# Patient Record
Sex: Female | Born: 1955
Health system: Southern US, Community
[De-identification: ages and names within clinical notes are randomized; demographics above are authoritative.]

## PROBLEM LIST (undated history)

## (undated) DIAGNOSIS — Z9889 Other specified postprocedural states: Secondary | ICD-10-CM

## (undated) DIAGNOSIS — R569 Unspecified convulsions: Secondary | ICD-10-CM

## (undated) DIAGNOSIS — M797 Fibromyalgia: Secondary | ICD-10-CM

## (undated) DIAGNOSIS — I10 Essential (primary) hypertension: Secondary | ICD-10-CM

## (undated) DIAGNOSIS — E7800 Pure hypercholesterolemia, unspecified: Secondary | ICD-10-CM

## (undated) DIAGNOSIS — R112 Nausea with vomiting, unspecified: Secondary | ICD-10-CM

## (undated) DIAGNOSIS — E119 Type 2 diabetes mellitus without complications: Secondary | ICD-10-CM

## (undated) DIAGNOSIS — K219 Gastro-esophageal reflux disease without esophagitis: Secondary | ICD-10-CM

## (undated) DIAGNOSIS — M489 Spondylopathy, unspecified: Secondary | ICD-10-CM

## (undated) DIAGNOSIS — J302 Other seasonal allergic rhinitis: Secondary | ICD-10-CM

## (undated) HISTORY — PX: COLONOSCOPY: SHX174

## (undated) HISTORY — PX: BACK SURGERY: SHX140

## (undated) HISTORY — PX: SHOULDER SURGERY: SHX246

## (undated) HISTORY — DX: Fibromyalgia: M79.7

## (undated) HISTORY — PX: TUBAL LIGATION: SHX77

## (undated) HISTORY — DX: Type 2 diabetes mellitus without complications: E11.9

## (undated) HISTORY — PX: CARPAL TUNNEL RELEASE: SHX101

## (undated) HISTORY — DX: Other seasonal allergic rhinitis: J30.2

## (undated) HISTORY — DX: Essential (primary) hypertension: I10

## (undated) HISTORY — DX: Spondylopathy, unspecified: M48.9

---

## 2003-05-15 ENCOUNTER — Ambulatory Visit (HOSPITAL_COMMUNITY): Admission: RE | Admit: 2003-05-15 | Discharge: 2003-05-15 | Payer: Self-pay | Admitting: Obstetrics and Gynecology

## 2003-07-10 ENCOUNTER — Ambulatory Visit (HOSPITAL_COMMUNITY): Admission: RE | Admit: 2003-07-10 | Discharge: 2003-07-10 | Payer: Self-pay | Admitting: Obstetrics and Gynecology

## 2003-08-13 ENCOUNTER — Ambulatory Visit (HOSPITAL_COMMUNITY): Admission: RE | Admit: 2003-08-13 | Discharge: 2003-08-13 | Payer: Self-pay

## 2004-12-23 ENCOUNTER — Other Ambulatory Visit: Admission: RE | Admit: 2004-12-23 | Discharge: 2004-12-23 | Payer: Self-pay | Admitting: Obstetrics and Gynecology

## 2007-05-21 ENCOUNTER — Ambulatory Visit: Payer: Self-pay | Admitting: Cardiology

## 2010-06-19 HISTORY — PX: CERVICAL SPINE SURGERY: SHX589

## 2010-07-21 ENCOUNTER — Ambulatory Visit (INDEPENDENT_AMBULATORY_CARE_PROVIDER_SITE_OTHER): Payer: 59 | Admitting: Gastroenterology

## 2010-07-21 ENCOUNTER — Encounter: Payer: Self-pay | Admitting: Gastroenterology

## 2010-07-21 VITALS — BP 152/95 | HR 93 | Temp 97.7°F | Ht 62.0 in | Wt 179.2 lb

## 2010-07-21 DIAGNOSIS — R197 Diarrhea, unspecified: Secondary | ICD-10-CM

## 2010-07-21 LAB — HEPATIC FUNCTION PANEL
AST: 23 U/L (ref 0–37)
Albumin: 4.4 g/dL (ref 3.5–5.2)
Alkaline Phosphatase: 66 U/L (ref 39–117)
Total Bilirubin: 0.4 mg/dL (ref 0.3–1.2)
Total Protein: 7.3 g/dL (ref 6.0–8.3)

## 2010-07-21 NOTE — Patient Instructions (Signed)
We will look for reasons for your IBS to be out of control. THE REASONS INCLUDE lactose intolerance, Giardiasis picked UP during camping, microscopic colitis, and less likely celiac sprue. CURRENT PLAN 1. FOLLOW  DAIRY FREE DIET FOR 2 WEEKS. 2. IF NO IMPROVEMENT, we will perform a FLEX SIG IN 3 WEEKS. 3. USE LEVSIN AS NEEDED. YOU MAY USE UP TO 8 PER DAY. 4. SUBMIT A STOOL SAMPLE. 5. HAVE BLOOD TEST TO LOOK FOR CELIAC SPRUE. 6. FOLLOW UP IN 1 ONE MONTH.    Lactose Free Diet Lactose is a carbohydrate that is found mainly in milk and milk products, as well as in foods with added milk or whey. Lactose must be digested by the enzyme in order to be used by the body. Lactose intolerance occurs when there is a shortage of lactase. When your body is not able to digest lactose, you may feel sick to your stomach (nausea), bloating, cramping, gas and diarrhea. TYPES OF LACTASE DEFICIENCY  Primary lactase deficiency. This is the most common type. It is characterized by a slow decrease in lactase activity.   Secondary lactase deficiency. This occurs following injury to the small intestinal mucosa as a result of diseases such as celiac disease, nontropical sprue, infectious gastroenteritis (stomach virus), malnutrition, parasites, or inflammatory bowel disease. It can also occur after treatment with medications that kill germs (antibiotics) or cancer drugs, or as a result of surgery.  Tolerance to lactose varies widely, and each person must determine how much milk can be consumed without developing symptoms. Drinking smaller portions of milk throughout the day may be helpful. Some studies suggest that slowing gastric emptying may help increase tolerance of milk products. This may be done by:  Consuming milk or milk products with a meal rather than alone.   Using milk with a higher fat content.  There are many dairy products that may be tolerated better than milk by some people:  Cheese (especially aged  cheese) - the lactose content is much lower than in milk.   The use of cultured dairy products such as yogurt, buttermilk, cottage cheese, and sweet acidophilus milk (Kefir) for lactase-deficient individuals is usually well tolerated. This is because the healthy bacteria help digest lactose.   Lactose-hydrolyzed milk (Lactaid) contains 40-90% less lactose than milk and may also be well tolerated.  ADEQUACY These diets may be deficient in calcium, riboflavin, and vitamin D, according to the Recommended Dietary Allowances of the [employer]. Depending on individual tolerances and the use of milk substitutes, milk, or other dairy products, these recommendations may be met. SPECIAL NOTES  Lactose is a carbohydrates. The major food source is dairy products. Reading food labels is important. Many products contain lactose even when they are not made from milk. Look for the following words: whey, milk solids, dry milk solids, nonfat dry milk powder. Typical sources of lactose other than dairy products include breads, candies, cold cuts, prepared and processed foods, and commercial sauces and gravies.   All foods must be prepared without milk, cream, or other dairy foods.   A vitamin/mineral supplement may be necessary. Consult your physician or Registered Dietitian.   Lactose also is found in many prescription and over-the-counter medications.   Soy milk and lactose-free supplements may be used as an alternative to milk.  FOOD GROUP ALLOWED/RECOMMENDED AVOID/USE SPARINGLY  BREADS / STARCHES 4 servings or more* Breads and rolls made without milk. [place], [place], or [place] bread. Breads and rolls that contain milk. Prepared mixes such  as muffins, biscuits, waffles, pancakes. Sweet rolls, donuts, [place] toast (if made with milk or lactose).  Crackers: Soda crackers, graham crackers. Any crackers prepared without lactose. Zwieback crackers, corn curls, or any that contain lactose.  Cereals:  Cooked or dry cereals prepared without lactose (read labels). Cooked or dry cereals prepared with lactose (read labels). Total, Cocoa Krispies. Special K.  Potatoes / Pasta / Rice: Any prepared without milk or lactose. Popcorn. Instant potatoes, frozen [place] fries, scalloped or au gratin potatoes.  VEGETABLES 2 servings or more Fresh, frozen, and canned vegetables. Creamed or breaded vegetables. Vegetables in a cheese sauce or with lactose-containing margarines.  FRUIT 2 servings or more All fresh, canned, or frozen fruits that are not processed with lactose. Any canned or frozen fruits processed with lactose.  MEAT & SUBSTITUTES 2 servings or more (4 to 6 oz. total per day) Plain beef, chicken, fish, [place], lamb, veal, pork, or ham. Kosher prepared meat products. Strained or junior meats that do not contain milk. Eggs, soy meat substitutes, nuts. Scrambled eggs, omelets, and souffles that contain milk. Creamed or breaded meat, fish, or fowl. Sausage products such as wieners, liver sausage, or cold cuts that contain milk solids. Cheese, cottage cheese, or cheese spreads.  MILK None. (See "BEVERAGES" for milk substitutes. See "DESSERTS" for ice cream and frozen desserts.) Milk (whole, 2%, skim, or chocolate). Evaporated, powdered, or condensed milk; malted milk.  SOUPS & COMBINATION FOODS Bouillon, broth, vegetable soups, clear soups, consomms. Homemade soups made with allowed ingredients. Combination or prepared foods that do not contain milk or milk products (read labels). Cream soups, chowders, commercially prepared soups containing lactose. Macaroni and cheese, pizza. Combination or prepared foods that contain milk or milk products.  DESSERTS & SWEETS In moderation Water and fruit ices; gelatin; angel food cake. Homemade cookies, pies, or cakes made from allowed ingredients. Pudding (if made with water or a milk substitute). Lactose-free tofu desserts. Sugar, honey, corn syrup, jam, jelly;  marmalade; molasses (beet sugar); Pure sugar candy; marshmallows. Ice cream, ice milk, sherbet, custard, pudding, frozen yogurt. Commercial cake and cookie mixes. Desserts that contain chocolate. Pie crust made with milk-containing margarine; reduced-calorie desserts made with a sugar substitute that contains lactose. Toffee, peppermint, butterscotch, chocolate, caramels.  FATS & OILS In moderation Butter (as tolerated; contains very small amounts of lactose). Margarines and dressings that do not contain milk, Vegetable oils, shortening, Miracle Whip, mayonnaise, nondairy cream & whipped toppings without lactose or milk solids added (examples: Coffee Rich, Carnation Coffeemate, Rich's Whipped Topping, PolyRich). Tomasa Blase. Margarines and salad dressings containing milk; cream, cream cheese; peanut butter with added milk solids, sour cream, chip dips, made with sour cream.  BEVERAGES Carbonated drinks; tea; coffee and freeze-dried coffee; some instant coffees (check labels). Fruit drinks; fruit and vegetable juice; Rice or Soy milk. Ovaltine, hot chocolate. Some cocoas; some instant coffees; instant iced teas; powdered fruit drinks (read labels).   CONDIMENTS / MISCELLANEOUS Soy sauce, carob powder, olives, gravy made with water, baker's cocoa, pickles, pure seasonings and spices, wine, pure monosodium glutamate, catsup, mustard. Some chewing gums, chocolate, some cocoas. Certain antibiotics and vitamin / mineral preparations. Spice blends if they contain milk products. MSG extender. Artificial sweeteners that contain lactose such as Equal (Nutra-Sweet) and Sweet 'n Low. Some nondairy creamers (read labels).  * These amounts indicate the minimum number of servings needed from the basic food groups to provide a variety of nutrients essential to good health. A maximum amount is listed if intake of certain  foods must be controlled. Combination foods may count as full or partial servings from the food groups.  Dark green, leafy, or orange vegetables are recommended 3 or 4 times weekly to provide vitamin A. A good source of vitamin C is recommended daily. Potatoes may be included as a serving of vegetables. SAMPLE MENU*  Breakfast   Orange Juice.  Banana.   Bran flakes.   Nondairy Creamer.  Vienna Bread (toasted).   Butter or milk-free margarine.   Coffee or tea.    Noon Meal   Chicken Breast.  Rice.   Green beans.   Butter or milk-free margarine.  Fresh melon.   Coffee or tea.    Evening Meal   Roast Beef.  Baked potato.   Butter or milk-free margarine.   Broccoli.   Lettuce salad with vinegar and oil dressing.  [employer].   Coffee or tea.   Document Released: 08/26/2001 Document Re-Released: 09/02/2007 [facility] Patient Information 2011 [place], [place].

## 2010-07-21 NOTE — Progress Notes (Signed)
Cc to PCP 

## 2010-07-21 NOTE — Progress Notes (Signed)
Pt is aware of her OV in one month

## 2010-07-21 NOTE — Progress Notes (Signed)
  Subjective:    Patient ID: Kelly Wiley, female    DOB: 1955-12-14, 55 y.o.   MRN: [id number] PCP: Dimas Aguas  HPI IBS for 30 years: pain and mixed stool, then passed out. For years has pain relieved by BMs. Also has reflux. Can't eat certain foods. Lots of diarrhea. Lower abd pain. Eats and has to run to [place]. Had GB U/S recently. EGD over five years ago. No test for celiac sprue. No GI visit for these symptoms. Bms: 10 a day over the past 2 years ( constant diarrhea and inability to eat certain foods). HAVING MORE RECTAL URGENCY. Weight loss: none. No problems swallowing. Heartburn: daily-Prilosec for 5 years. Initially helped and now feels like she needs more of it. Weight gain: 30-40 lbs, can't exercise. Lots of nausea and not much vomiting. Recent spinal surgey: Thur. TCS: age 7, Fleisch[place]-no polyps. No blood in her stool. Last labs in [place]. Levsin helps-prn, doesn't use it every day.  LAST TRAVEL LAST YEAR: [place]. Had Abx prior to her surgery. No well water. Remote camping experience. Increase stress over the last 2-3 years and months. On Celexa ~8 mos. ALWAYS HAS PRESSURE IN THE LOWER ABD AND lots of gas. MILK: 1-2 TIMES A DAY, ICE CREAM: 1-2 TIMES A WEEK. CHEESE: AT LEAST 3 TIMES A WEEK.  NO FAM HX OF DIARRHEA  Past Medical History  Diagnosis Date  . Fibromyalgia   . Cervical spine disease   . Seasonal allergies   . DM (diabetes mellitus)   . HTN (hypertension)    Past Surgical History  Procedure Date  . Cervical spine surgery APR 2012  . Carpal tunnel release RIGHT  . Shoulder surgery RIGHT  . Colonoscopy 2008 [facility]     [place]  . Tubal ligation    Family History  Problem Relation Age of Onset  . Colon cancer Neg Hx   . Colon polyps Neg Hx    History  Substance Use Topics  . Smoking status: Never Smoker   . Smokeless tobacco: Not on file  . Alcohol Use: No   History   Social History Narrative   MARRIED, 2 KIDS, YOUNGEST 31 YO.Works in Audiological scientist in  [place]: rareNo tobacco products   Review of Systems  All other systems reviewed and are negative.       Objective:   Physical Exam  Constitutional: She is oriented to person, place, and time.  HENT:  Head: Normocephalic and atraumatic.  Mouth/Throat: Oropharynx is clear and moist. No oropharyngeal exudate.  Eyes: Pupils are equal, round, and reactive to light.  Neck:       LROM, soft C collar in place, wound dressing CD  Cardiovascular: Normal rate and regular rhythm.   Pulmonary/Chest: Effort normal and breath sounds normal.  Abdominal: Soft. Bowel sounds are normal. She exhibits no distension. There is tenderness. There is no rebound and no guarding.       OBESE, MILD TTP LLQ  Musculoskeletal: She exhibits no edema.  Lymphadenopathy:    She has no cervical adenopathy.  Neurological: She is alert and oriented to person, place, and time.  Skin: Skin is warm and dry.  Psychiatric: She has a normal mood and affect.          Assessment & Plan:

## 2010-07-21 NOTE — Progress Notes (Signed)
Pt advised to hold glipizide morning of procedure per [facility].

## 2010-07-21 NOTE — Assessment & Plan Note (Addendum)
The differential diagnosis INCLUDEs lactose intolerance, Giardiasis picked UP during camping, microscopic colitis, and less likely celiac sprue. 1. FOLLOW  DAIRY FREE DIET FOR 2 WEEKS. 2. IF NO IMPROVEMENT, we will perform a FLEX SIG IN 3 WEEKS. HOLD GLIPIZIDE ON AM OF PROCEDURE. 3. USE LEVSIN AS NEEDED. YOU MAY USE UP TO 8 PER DAY. 4. SUBMIT A STOOL SAMPLE. 5. HAVE BLOOD TEST TO LOOK FOR CELIAC SPRUE AND TO CHECK YOUR PROTEIN. 6. FOLLOW UP IN 1 ONE MONTH. MAY ADD A probiotic.

## 2010-07-26 NOTE — Progress Notes (Signed)
Please call pt. Her liver enzymes are nl. She needs to submit her stool samples

## 2010-07-26 NOTE — Progress Notes (Signed)
Pt informed. Said she will do the stool test soon.

## 2010-08-05 NOTE — H&P (Signed)
NAME:  Kelly Wiley, BRUSTER                           ACCOUNT NO.:  [id number]   MEDICAL RECORD NO.:  [id number]                   PATIENT TYPE:  AMB   LOCATION:  [facility]C                                  FACILITY:  [facility]   PHYSICIAN:  James A. Ashley Royalty, M.D.             DATE OF BIRTH:  09-13-1955   DATE OF ADMISSION:  07/10/2003  DATE OF DISCHARGE:                                HISTORY & PHYSICAL   HISTORY OF PRESENT ILLNESS:  This is a 55 year old gravida 2, para 2, with a  several-year history of pelvic pain and low back pain.  She presented to me  for the first time on May 08, 2003, with this complaint.  She states it  is equal bilaterally.  It is constant when it occurs, it tends to occur for  days at a time. There are no associated mediating symptoms.  In the course  of the original consultation she also stated after not having a period for  approximately 12 months, she noted some slight vaginal bleeding in early  February of 2005.   She states that she had an ultrasound in October of 2004 by her previous  gynecologist and it revealed a small fibroid, but otherwise was normal.  She  had a CT scan of the abdomen and pelvis on May 15, 2003.  The study  revealed a fatty infiltrate in the liver.  There was a partially septated  cervical cyst as well measuring 3.1 cm in greatest diameter.  There was a  normal appearing uterus.  The ovaries were normal bilaterally with the  exception of a 1.4 cm left ovarian cyst.  The patient had a sonohysterogram  and endometrial biopsy as well.  The sonohysterogram was obtained in March  of 2005 and corroborated presence of a Nabothian cyst or cysts.  There was a  small fundal/posterior myoma of 4 mg in greatest diameter only.  The adnexa  were unremarkable bilaterally.  Endometrial biopsy was obtained on June 04, 2003, and revealed proliferative endometrium with no hyperplasia.  FSH was  obtained and the value was 41.  The patient is hence for a  diagnostic/operative laparoscopy.   MEDICATIONS:  1. Porcelain Tizanidine 2 mg one at bedtime for fibromyalgia.  2. Triamterene/HCTZ one as needed.  3. Hyoscyamine 0.125 mg one to two tablets as needed (for IBS).  4. Claritin 10 mg as needed.  5. Prilosec 20 mg as needed.  6. Hydrocodone/APAP one tablet daily as needed for fibromyalgia.  7. Glipizide one tablet in a.m. (for high blood sugar).   PAST MEDICAL HISTORY:  1. Fibromyalgia.  2. Rosacea.  3. Irritable bowel syndrome.  4. High blood sugar no definite diagnosis of diabetes.  5. Nephrolithiasis.   PAST SURGICAL HISTORY:  Tubal ligation in 1981.   ALLERGIES:  SULFA, IODINE, DILANTIN, ZARONTIN.   FAMILY HISTORY:  Positive for hypertension and diabetes.   SOCIAL HISTORY:  The patient denies the use of tobacco.  She rarely consumes  alcohol.   REVIEW OF SYSTEMS:  Noncontributory.   PHYSICAL EXAMINATION:  GENERAL:  A well-developed, well-nourished, pleasant,  white female in no acute distress.  VITAL SIGNS:  Afebrile, vital signs stable.  SKIN:  Warm and dry without lesions.  LYMPHS:  There is no supraclavicular, cervical, or inguinal adenopathy.  HEENT:  Normocephalic.  NECK:  Supple without thyromegaly.  CHEST:  Lungs are clear.  HEART:  Regular rate and rhythm without murmurs, rubs, or gallops.  BREASTS:  Deferred.  ABDOMEN:  Soft and nontender without masses or organomegaly.  Bowel sounds  are active.  MUSCULOSKELETAL:  Full range of motion without edema, cyanosis, or CVA  tenderness.  PELVIC:  External genitalia within normal limits.  Vagina and cervix are  without gross lesions.  Bimanual examination reveals the uterus to be  approximately 8 x 4 x 4 cm. No adnexal masses are palpable.   IMPRESSION:  1. Pelvic pain of longstanding duration - etiology uncertain.  Differential     includes endometriosis, adhesions, primary gastrointestinal,     musculoskeletal.  2. Nabothian's cyst or cysts on CT scan.  3.  Fibroid uterus (small).  4. Climacteric.  5. Fibromyalgia.  6. Hypertension.  7. Irritable bowel syndrome.  8. High blood sugar without definite diagnosis of diabetes.  9. Rosacea.  10.      History of nephrolithiasis.  11.      Status post tubal sterilization procedure.   PLAN:  Diagnostic/operative laparoscopy.  Risks, benefits, complications,  and alternatives were fully discussed with the patient.  Possible unilateral  salpingo-oophorectomy discussed and accepted.  Possibility of exploratory  laparotomy discussed and accepted.  Questions invited and answered.                                               James A. Ashley Royalty, M.D.    JAM/MEDQ  D:  07/10/2003  T:  07/10/2003  Job:  [id number]

## 2010-08-05 NOTE — Op Note (Signed)
NAME:  Kelly Wiley, Kelly Wiley                           ACCOUNT NO.:  [id number]   MEDICAL RECORD NO.:  [id number]                   PATIENT TYPE:  AMB   LOCATION:  SDC                                  [facility]ACILITY:  [facility]   PHYSICIAN:  James A. Ashley Royalty, M.D.             DATE OF BIRTH:  1956/02/02   DATE OF PROCEDURE:  07/10/2003  DATE OF DISCHARGE:  07/10/2003                                 OPERATIVE REPORT   PREOPERATIVE DIAGNOSIS:  1. Pelvic pain.  2. Status post tubal sterilization procedure.  3. 2+ cm nabothian cyst.   POSTOPERATIVE DIAGNOSIS:  1. Pelvic pain.  2. Adhesion left colon to left (distal) fallopian tube.   OPERATION PERFORMED:  1. Diagnostic and operative laparoscopy.  2. Lysis of adhesions.   SURGEON:  Rudy Jew. Ashley Royalty, M.D.   ANESTHESIA:  General.   ESTIMATED BLOOD LOSS:  25 mL.   COMPLICATIONS:  None.   PACKS AND DRAINS:  None.   DESCRIPTION OF PROCEDURE:  The patient was taken to the operating room and  placed in dorsal supine position.  After adequate general endotracheal  anesthesia was administered, she was placed in the lithotomy position and  prepped and draped in the usual manner for abdominal and vaginal surgery.  A  posterior weighted retractor was placed in the vagina.  The anterior lip of  the cervix was grabbed with a single toothed tenaculum.  A Jarcho uterine  manipulator was placed per cervix and held in place with a tenaculum.  The  bladder was drained with a red rubber catheter.  0.5% Marcaine with  1:100,000 epinephrine was instilled in the inferior aspect of the umbilicus.  A size 10-11 disposable trocar was placed through the umbilicus into the  abdominal cavity.  Its location was verified by placement of the  laparoscope.  There was no evidence of any trauma.  A pneumoperitoneum was  created with CO2 and maintained throughout the procedure.  Again there was  no evidence of any trauma.  Next, the pelvis was thoroughly surveyed.  The  uterus  was normal size, shape and contour without evidence of any  endometriosis or fibroids.  The ovaries were normal size, shape and contour  bilaterally without any evidence of cysts or endometriosis.  The fallopian  tubes were normal size, shape, contour and length with interruption in the  distal aspect of the proximal ampullary portion consistent with a previous  tubal sterilization procedure.  There was an adhesion from the appendices  epiploica of the left colon to the distal segment of the left fallopian  tube.  The adhesion was rather dense.  The anterior and posterior cul-de-  sacs were clean.  The remainder of the peritoneal surfaces were smooth and  glistening.  The appendix was normal.  Appropriate photos were obtained.  5  mm trocars were placed in the left and right lower quadrants respectively.  Transillumination and direct  visualization techniques were employed.  The  adhesion was lysed using the monopolar scissors with monopolar cautery.  There was a small amount of bleeding encountered from the area of the distal  fallopian tube segment.  This was easily controlled with a bipolar cautery.  Hemostasis was noted.  Appropriate photos were obtained before and after.   The pressure was allowed to diminish inside the abdominal cavity and  hemostasis continued to be good.  Copious irrigation was accomplished with  Nezhat suction irrigator.  At this point, the patient was felt to have  benefitted maximally from the surgical procedure.  The abdominal instruments  were removed and pneumoperitoneum evacuated.  Fascial defects were closed  with 0 Vicryl in an interrupted fashion.  Skin was closed with 3-0 Monocryl  superiorly and Dermabond inferiorly.  The remainder of the 10 mL of 0.5%  Marcaine with 1:100,000 epinephrine was instilled into the incisions to aid  in postoperative analgesia.  The vaginal instruments were removed.  Hemostasis was noted and the procedure terminated.  The  patient tolerated  the procedure extremely well and was returned to the recovery room in good  condition.                                               James A. Ashley Royalty, M.D.    JAM/MEDQ  D:  07/10/2003  T:  07/12/2003  Job:  [id number]

## 2010-08-16 ENCOUNTER — Other Ambulatory Visit: Payer: 59 | Admitting: Gastroenterology

## 2010-08-16 ENCOUNTER — Ambulatory Visit (HOSPITAL_COMMUNITY): Admission: RE | Admit: 2010-08-16 | Payer: 59 | Source: Ambulatory Visit | Admitting: Gastroenterology

## 2010-08-24 ENCOUNTER — Ambulatory Visit: Payer: 59 | Admitting: Gastroenterology

## 2010-10-13 ENCOUNTER — Ambulatory Visit: Payer: 59 | Admitting: Gastroenterology

## 2010-12-08 ENCOUNTER — Ambulatory Visit: Payer: 59 | Admitting: Gastroenterology

## 2010-12-28 ENCOUNTER — Encounter: Payer: Self-pay | Admitting: Gastroenterology

## 2010-12-28 ENCOUNTER — Ambulatory Visit (INDEPENDENT_AMBULATORY_CARE_PROVIDER_SITE_OTHER): Payer: 59 | Admitting: Gastroenterology

## 2010-12-28 VITALS — BP 142/90 | HR 85 | Temp 98.1°F | Ht 62.0 in | Wt 173.0 lb

## 2010-12-28 DIAGNOSIS — R1310 Dysphagia, unspecified: Secondary | ICD-10-CM | POA: Insufficient documentation

## 2010-12-28 DIAGNOSIS — R634 Abnormal weight loss: Secondary | ICD-10-CM | POA: Insufficient documentation

## 2010-12-28 DIAGNOSIS — K219 Gastro-esophageal reflux disease without esophagitis: Secondary | ICD-10-CM | POA: Insufficient documentation

## 2010-12-28 DIAGNOSIS — R197 Diarrhea, unspecified: Secondary | ICD-10-CM

## 2010-12-28 MED ORDER — DEXLANSOPRAZOLE 60 MG PO CPDR: 60.0000 mg | DELAYED_RELEASE_CAPSULE | Freq: Every day | ORAL | Status: DC

## 2010-12-28 NOTE — Patient Instructions (Addendum)
Please have stool studies and lab work done as soon as possible. We have scheduled you for a flexible sigmoidoscopy and upper endoscopy. See separate instructions. I will discuss using antibiotics before your procedure with Dr. Darrick Penna and let you know. Stop omeprazole. Begin Dexilant 60mg  daily. We have given you samples AND I sent a prescription to [facility] in [place].

## 2010-12-28 NOTE — Progress Notes (Signed)
Primary Care Physician:  Criss Rosales, MD  Primary Gastroenterologist:    Chief Complaint  Patient presents with  . Diarrhea    HPI:  Kelly Wiley is a 55 y.o. female here for f/u diarrhea. Last seen in 07/2010. She did not f/u with stool test and flex sigmoidoscopy. Diarrhea continues to get worse. Some normal stool. Recent cruise, sick for four days with diarrhea. Fatty foods cause diarrhea. All fish causes diarrhea. Takes Levsin about once every couple of weeks. Lomotil prn which works. No melena, brbpr. No fever. Associated with lower abdominal cramps. Cramps relieved by BM. Increased gas. Heartburn doing okay on omeprazole bid. Previously failed Prevacid. Still feels like stomach full of acid. Sometimes feels like lump in throat with eating. Causes cough. Lump in throat and gets hoarse. Trouble swallowing food. Epigastric discomfort/burning.   Current Outpatient Prescriptions  Medication Sig Dispense Refill  . diclofenac (VOLTAREN) 75 MG EC tablet Take 75 mg by mouth 2 (two) times daily.        Marland Kitchen glipiZIDE (GLUCOTROL) 5 MG tablet Take 5 mg by mouth 2 (two) times daily before a meal.        . HYDROcodone-acetaminophen (LORTAB) 10-500 MG per tablet Take 1 tablet by mouth every 6 (six) hours as needed.        . hyoscyamine (LEVSIN, ANASPAZ) 0.125 MG tablet Take 0.125 mg by mouth every 4 (four) hours as needed.        . loratadine (CLARITIN) 10 MG tablet Take 10 mg by mouth daily.        Marland Kitchen losartan (COZAAR) 50 MG tablet Take 50 mg by mouth daily.        Marland Kitchen LYRICA 50 MG capsule Take 50 mg by mouth 2 (two) times daily.       . promethazine (PHENERGAN) 25 MG tablet Take 25 mg by mouth every 6 (six) hours as needed.        . triamterene-hydrochlorothiazide (MAXZIDE-25) 37.5-25 MG per tablet Take 1 tablet by mouth daily.        Marland Kitchen dexlansoprazole (DEXILANT) 60 MG capsule Take 1 capsule (60 mg total) by mouth daily.  30 capsule  5    Allergies as of 12/28/2010 - Review Complete 12/28/2010    Allergen Reaction Noted  . Dilantin Other (See Comments) 07/21/2010  . Iodine 131 Other (See Comments) 07/21/2010  . Sulfa antibiotics Other (See Comments) 07/21/2010  . Zarontin Other (See Comments) 07/21/2010    Past Medical History  Diagnosis Date  . Fibromyalgia   . Cervical spine disease   . Seasonal allergies   . DM (diabetes mellitus)   . HTN (hypertension)     Past Surgical History  Procedure Date  . Cervical spine surgery APR 2012  . Carpal tunnel release RIGHT  . Shoulder surgery RIGHT  . Colonoscopy 2008 [facility]     [place]  . Tubal ligation     Family History  Problem Relation Age of Onset  . Colon cancer Neg Hx   . Colon polyps Neg Hx     History   Social History  . Marital Status: Married    Spouse Name: N/A    Number of Children: N/A  . Years of Education: N/A   Occupational History  . Not on file.   Social History Main Topics  . Smoking status: Never Smoker   . Smokeless tobacco: Not on file  . Alcohol Use: No  . Drug Use: No  . Sexually Active: Not on  file   Other Topics Concern  . 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 Weight down 6 pounds since 07/2010.  Eyes: Negative for vision changes.  ENT: Negative for hoarseness, nasal congestion. CV: Negative for chest pain, angina, palpitations, dyspnea on exertion, peripheral edema.  Respiratory: Negative for dyspnea at rest, dyspnea on exertion, cough, sputum, wheezing.  GI: See history of present illness. GU:  Negative for dysuria, hematuria, urinary incontinence, urinary frequency, nocturnal urination.  MS: muscle pain Derm: Negative for rash or itching.  Neuro: Negative for weakness, abnormal sensation, seizure, frequent headaches, memory loss, confusion.  Psych: Negative for anxiety, depression, suicidal  ideation, hallucinations.  Endo: Negative for unusual weight change. See HPI. Heme: Negative for bruising or bleeding. Allergy: Negative for rash or hives.    Physical Examination:  BP 142/90  Pulse 85  Temp(Src) 98.1 F (36.7 C) (Temporal)  Ht 5\' 2"  (1.575 m)  Wt 173 lb (78.472 kg)  BMI 31.64 kg/m2   General: Well-nourished, well-developed in no acute distress.  Head: Normocephalic, atraumatic.   Eyes: Conjunctiva pink, no icterus. Mouth: Oropharyngeal mucosa moist and pink , no lesions erythema or exudate. Neck: Supple without thyromegaly, masses, or lymphadenopathy.  Lungs: Clear to auscultation bilaterally.  Heart: Regular rate and rhythm, no murmurs rubs or gallops.  Abdomen: Bowel sounds are normal, nontender, nondistended, no hepatosplenomegaly or masses, no abdominal bruits or    hernia , no rebound or guarding.   Rectal: Defer to time of TCS. Extremities: No lower extremity edema. No clubbing or deformities.  Neuro: Alert and oriented x 4 , grossly normal neurologically.  Skin: Warm and dry, no rash or jaundice.   Psych: Alert and cooperative, normal mood and affect.

## 2010-12-29 ENCOUNTER — Other Ambulatory Visit: Payer: Self-pay | Admitting: Gastroenterology

## 2010-12-29 ENCOUNTER — Encounter: Payer: Self-pay | Admitting: Gastroenterology

## 2010-12-29 DIAGNOSIS — R634 Abnormal weight loss: Secondary | ICD-10-CM

## 2010-12-29 DIAGNOSIS — K219 Gastro-esophageal reflux disease without esophagitis: Secondary | ICD-10-CM

## 2010-12-29 DIAGNOSIS — R197 Diarrhea, unspecified: Secondary | ICD-10-CM

## 2010-12-29 NOTE — Assessment & Plan Note (Addendum)
Refractory GERD, epigastric burning, dysphagia. EGD/ED.  I have discussed the risks, alternatives, benefits with regards to but not limited to the risk of reaction to medication, bleeding, infection, perforation and the patient is agreeable to proceed. Written consent to be obtained.  Stop omeprazole and start Dexilant.

## 2010-12-29 NOTE — Assessment & Plan Note (Addendum)
Chronic diarrhea, weight loss. H/O IBS. Need to r/o superimposed infection, consider microscopic colitis, celiac disease. Flex sig in near future.  I have discussed the risks, alternatives, benefits with regards to but not limited to the risk of reaction to medication, bleeding, infection, perforation and the patient is agreeable to proceed. Written consent to be obtained.  Stools and labs.

## 2011-01-03 ENCOUNTER — Ambulatory Visit (INDEPENDENT_AMBULATORY_CARE_PROVIDER_SITE_OTHER): Payer: 59 | Admitting: Gastroenterology

## 2011-01-03 DIAGNOSIS — R197 Diarrhea, unspecified: Secondary | ICD-10-CM

## 2011-01-03 LAB — CBC WITH DIFFERENTIAL/PLATELET
Eosinophils Absolute: 0.1 10*3/uL (ref 0.0–0.7)
Eosinophils Relative: 2 % (ref 0–5)
Lymphs Abs: 2.2 10*3/uL (ref 0.7–4.0)
MCH: 29.8 pg (ref 26.0–34.0)
MCV: 88.5 fL (ref 78.0–100.0)
Monocytes Absolute: 0.5 10*3/uL (ref 0.1–1.0)
Monocytes Relative: 7 % (ref 3–12)
Platelets: 231 10*3/uL (ref 150–400)
RBC: 4.86 MIL/uL (ref 3.87–5.11)

## 2011-01-03 LAB — CLOSTRIDIUM DIFFICILE BY PCR: Toxigenic C. Difficile by PCR: NOT DETECTED

## 2011-01-03 LAB — IFOBT (OCCULT BLOOD): IFOBT: NEGATIVE

## 2011-01-03 NOTE — Progress Notes (Signed)
Quick Note:  Labs good. Stools all negative except lactoferrin. Procedures as planned. ______

## 2011-01-03 NOTE — Progress Notes (Signed)
Quick Note:  Stool neg for blood. Procedures as planned. ______

## 2011-01-04 NOTE — Progress Notes (Signed)
Cc to PCP 

## 2011-01-04 NOTE — Progress Notes (Signed)
Quick Note:  LMOM to call. ______ 

## 2011-01-05 NOTE — Progress Notes (Signed)
Quick Note:  Pt informed ______ 

## 2011-01-10 ENCOUNTER — Telehealth: Payer: Self-pay | Admitting: Gastroenterology

## 2011-01-10 ENCOUNTER — Encounter: Payer: Self-pay | Admitting: Gastroenterology

## 2011-01-10 NOTE — Telephone Encounter (Signed)
Pt called requesting date change for her procedure- It has been moved from 11/09 to 11/06

## 2011-01-17 NOTE — Progress Notes (Signed)
Discussed with Dr. Darrick Penna regarding possibility of giving antibiotics prior to procedures since patient's orthopedist had given her a card suggesting it.  Per Dr. Darrick Penna, patient should take Flagyl one gram PO one hour before procedure. Please call in RX. No refills.

## 2011-01-18 NOTE — Progress Notes (Signed)
Pt returned call and was informed. Called [facility] in [place] and LMOVM with the order.

## 2011-01-18 NOTE — Progress Notes (Signed)
LM for pt to call

## 2011-01-23 MED ORDER — SODIUM CHLORIDE 0.45 % IV SOLN
Freq: Once | INTRAVENOUS | Status: AC
  Administered 2011-01-24: 09:00:00 via INTRAVENOUS

## 2011-01-24 ENCOUNTER — Telehealth: Payer: Self-pay | Admitting: Gastroenterology

## 2011-01-24 ENCOUNTER — Encounter (HOSPITAL_COMMUNITY): Payer: Self-pay | Admitting: *Deleted

## 2011-01-24 ENCOUNTER — Other Ambulatory Visit: Payer: Self-pay | Admitting: Gastroenterology

## 2011-01-24 ENCOUNTER — Ambulatory Visit (HOSPITAL_COMMUNITY)
Admission: RE | Admit: 2011-01-24 | Discharge: 2011-01-24 | Disposition: A | Discharge status: Home / Self Care | Payer: 59 | Source: Ambulatory Visit | Attending: Gastroenterology | Admitting: Gastroenterology

## 2011-01-24 ENCOUNTER — Encounter (HOSPITAL_COMMUNITY)
Admission: RE | Disposition: A | Discharge status: Home / Self Care | Payer: Self-pay | Source: Ambulatory Visit | Attending: Gastroenterology

## 2011-01-24 DIAGNOSIS — E119 Type 2 diabetes mellitus without complications: Secondary | ICD-10-CM | POA: Insufficient documentation

## 2011-01-24 DIAGNOSIS — K2990 Gastroduodenitis, unspecified, without bleeding: Secondary | ICD-10-CM

## 2011-01-24 DIAGNOSIS — R197 Diarrhea, unspecified: Secondary | ICD-10-CM

## 2011-01-24 DIAGNOSIS — K648 Other hemorrhoids: Secondary | ICD-10-CM

## 2011-01-24 DIAGNOSIS — I10 Essential (primary) hypertension: Secondary | ICD-10-CM | POA: Insufficient documentation

## 2011-01-24 DIAGNOSIS — R1310 Dysphagia, unspecified: Secondary | ICD-10-CM | POA: Insufficient documentation

## 2011-01-24 DIAGNOSIS — Z79899 Other long term (current) drug therapy: Secondary | ICD-10-CM | POA: Insufficient documentation

## 2011-01-24 DIAGNOSIS — K219 Gastro-esophageal reflux disease without esophagitis: Secondary | ICD-10-CM

## 2011-01-24 DIAGNOSIS — R634 Abnormal weight loss: Secondary | ICD-10-CM | POA: Insufficient documentation

## 2011-01-24 DIAGNOSIS — K2940 Chronic atrophic gastritis without bleeding: Secondary | ICD-10-CM | POA: Insufficient documentation

## 2011-01-24 DIAGNOSIS — K2970 Gastritis, unspecified, without bleeding: Secondary | ICD-10-CM

## 2011-01-24 DIAGNOSIS — Z01812 Encounter for preprocedural laboratory examination: Secondary | ICD-10-CM | POA: Insufficient documentation

## 2011-01-24 HISTORY — PX: ESOPHAGOGASTRODUODENOSCOPY: SHX5428

## 2011-01-24 HISTORY — PX: FLEXIBLE SIGMOIDOSCOPY: SHX5431

## 2011-01-24 HISTORY — DX: Unspecified convulsions: R56.9

## 2011-01-24 HISTORY — DX: Other specified postprocedural states: R11.2

## 2011-01-24 HISTORY — PX: SAVORY DILATION: SHX5439

## 2011-01-24 HISTORY — DX: Other specified postprocedural states: Z98.890

## 2011-01-24 HISTORY — DX: Gastro-esophageal reflux disease without esophagitis: K21.9

## 2011-01-24 HISTORY — DX: Pure hypercholesterolemia, unspecified: E78.00

## 2011-01-24 LAB — GLUCOSE, CAPILLARY: Glucose-Capillary: 173 mg/dL — ABNORMAL HIGH (ref 70–99)

## 2011-01-24 SURGERY — SIGMOIDOSCOPY, FLEXIBLE
Anesthesia: Moderate Sedation

## 2011-01-24 MED ORDER — BUTAMBEN-TETRACAINE-BENZOCAINE 2-2-14 % EX AERO
INHALATION_SPRAY | CUTANEOUS | Status: DC | PRN
  Administered 2011-01-24: 1 via TOPICAL

## 2011-01-24 MED ORDER — MINERAL OIL PO OIL
TOPICAL_OIL | ORAL | Status: AC
  Filled 2011-01-24: qty 30

## 2011-01-24 MED ORDER — MEPERIDINE HCL 100 MG/ML IJ SOLN
INTRAMUSCULAR | Status: DC | PRN
  Administered 2011-01-24: 25 mg via INTRAVENOUS
  Administered 2011-01-24: 50 mg via INTRAVENOUS

## 2011-01-24 MED ORDER — PROMETHAZINE HCL 25 MG/ML IJ SOLN
INTRAMUSCULAR | Status: DC | PRN
  Administered 2011-01-24: 12.5 mg via INTRAVENOUS

## 2011-01-24 MED ORDER — MINERAL OIL LIGHT 100 % EX OIL
TOPICAL_OIL | CUTANEOUS | Status: DC | PRN
  Administered 2011-01-24: 1 via TOPICAL

## 2011-01-24 MED ORDER — MIDAZOLAM HCL 5 MG/5ML IJ SOLN
INTRAMUSCULAR | Status: AC
  Filled 2011-01-24: qty 10

## 2011-01-24 MED ORDER — MEPERIDINE HCL 100 MG/ML IJ SOLN
INTRAMUSCULAR | Status: AC
  Filled 2011-01-24: qty 2

## 2011-01-24 MED ORDER — SIMETHICONE 40 MG/0.6ML PO SUSP
ORAL | Status: DC | PRN
  Administered 2011-01-24: 09:00:00

## 2011-01-24 MED ORDER — PROMETHAZINE HCL 25 MG/ML IJ SOLN
INTRAMUSCULAR | Status: AC
  Filled 2011-01-24: qty 1

## 2011-01-24 MED ORDER — SODIUM CHLORIDE 0.9 % IJ SOLN
INTRAMUSCULAR | Status: AC
  Filled 2011-01-24: qty 10

## 2011-01-24 MED ORDER — MIDAZOLAM HCL 5 MG/5ML IJ SOLN
INTRAMUSCULAR | Status: DC | PRN
  Administered 2011-01-24: 1 mg via INTRAVENOUS
  Administered 2011-01-24: 2 mg via INTRAVENOUS
  Administered 2011-01-24: 1 mg via INTRAVENOUS

## 2011-01-24 NOTE — Telephone Encounter (Signed)
Cliffton Asters, RN from short stay called to let us know that SF wants pt to have barium pill esophgram done this week and the reschedule EGD/ED in 2 weeks.

## 2011-01-24 NOTE — H&P (Signed)
Kelly Wiley   12/28/2010 3:30 PM Office Visit  MRN: [id number]   Description: 55 year old female  Provider: Tana Coast, PA  Department: [facility]        Diagnoses     Diarrhea   - Primary    787.91    Dysphagia     787.20    GERD (gastroesophageal reflux disease)     530.81    Weight loss, abnormal     783.21      Reason for Visit     Diarrhea        Vitals - Last Recorded       BP Pulse Temp(Src) Ht Wt BMI    142/90  85  98.1 F (36.7 C) (Temporal)  5\' 2"  (1.575 m)  173 lb (78.472 kg)  31.64 kg/m2       Vitals History Recorded       Progress Notes     Tana Coast, PA  12/29/2010  9:35 PM  Signed   Primary Care Physician:  Criss Rosales, MD   Primary Gastroenterologist:      Chief Complaint   Patient presents with   .  Diarrhea      HPI:  Kelly Wiley is a 55 y.o. female here for f/u diarrhea. Last seen in 07/2010. She did not f/u with stool test and flex sigmoidoscopy. Diarrhea continues to get worse. Some normal stool. Recent cruise, sick for four days with diarrhea. Fatty foods cause diarrhea. All fish causes diarrhea. Takes Levsin about once every couple of weeks. Lomotil prn which works. No melena, brbpr. No fever. Associated with lower abdominal cramps. Cramps relieved by BM. Increased gas. Heartburn doing okay on omeprazole bid. Previously failed Prevacid. Still feels like stomach full of acid. Sometimes feels like lump in throat with eating. Causes cough. Lump in throat and gets hoarse. Trouble swallowing food. Epigastric discomfort/burning.     Current Outpatient Prescriptions   Medication  Sig  Dispense  Refill   .  diclofenac (VOLTAREN) 75 MG EC tablet  Take 75 mg by mouth 2 (two) times daily.           Marland Kitchen  glipiZIDE (GLUCOTROL) 5 MG tablet  Take 5 mg by mouth 2 (two) times daily before a meal.           .  HYDROcodone-acetaminophen (LORTAB) 10-500 MG per tablet  Take 1 tablet by mouth every 6 (six) hours as needed.           .   hyoscyamine (LEVSIN, ANASPAZ) 0.125 MG tablet  Take 0.125 mg by mouth every 4 (four) hours as needed.           .  loratadine (CLARITIN) 10 MG tablet  Take 10 mg by mouth daily.           Marland Kitchen  losartan (COZAAR) 50 MG tablet  Take 50 mg by mouth daily.           Marland Kitchen  LYRICA 50 MG capsule  Take 50 mg by mouth 2 (two) times daily.          .  promethazine (PHENERGAN) 25 MG tablet  Take 25 mg by mouth every 6 (six) hours as needed.           .  triamterene-hydrochlorothiazide (MAXZIDE-25) 37.5-25 MG per tablet  Take 1 tablet by mouth daily.           Marland Kitchen  dexlansoprazole (DEXILANT) 60 MG capsule  Take 1 capsule (60 mg total) by mouth daily.   30 capsule   5       Allergies as of 12/28/2010 - Review Complete 12/28/2010   Allergen  Reaction  Noted   .  Dilantin  Other (See Comments)  07/21/2010   .  Iodine 131  Other (See Comments)  07/21/2010   .  Sulfa antibiotics  Other (See Comments)  07/21/2010   .  Zarontin  Other (See Comments)  07/21/2010       Past Medical History   Diagnosis  Date   .  Fibromyalgia     .  Cervical spine disease     .  Seasonal allergies     .  DM (diabetes mellitus)     .  HTN (hypertension)         Past Surgical History   Procedure  Date   .  Cervical spine surgery  APR 2012   .  Carpal tunnel release  RIGHT   .  Shoulder surgery  RIGHT   .  Colonoscopy  2008 [facility]        [place]   .  Tubal ligation         Family History   Problem  Relation  Age of Onset   .  Colon cancer  Neg Hx     .  Colon polyps  Neg Hx         History       Social History   .  Marital Status:  Married       Spouse Name:  N/A       Number of Children:  N/A   .  Years of Education:  N/A       Occupational History   .  Not on file.       Social History Main Topics   .  Smoking status:  Never Smoker    .  Smokeless tobacco:  Not on file   .  Alcohol Use:  No   .  Drug Use:  No   .  Sexually Active:  Not on file       Other Topics  Concern   .  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 Weight down 6 pounds since 07/2010.   Eyes: Negative for vision changes.   ENT: Negative for hoarseness, nasal congestion. CV: Negative for chest pain, angina, palpitations, dyspnea on exertion, peripheral edema.   Respiratory: Negative for dyspnea at rest, dyspnea on exertion, cough, sputum, wheezing.   GI: See history of present illness. GU:  Negative for dysuria, hematuria, urinary incontinence, urinary frequency, nocturnal urination.   MS: muscle pain Derm: Negative for rash or itching.   Neuro: Negative for weakness, abnormal sensation, seizure, frequent headaches, memory loss, confusion.   Psych: Negative for anxiety, depression, suicidal ideation, hallucinations.   Endo: Negative for unusual weight change. See HPI. Heme: Negative for bruising or bleeding. Allergy: Negative for rash or hives.     Physical Examination:   BP 142/90  Pulse 85  Temp(Src) 98.1 F (36.7 C) (Temporal)  Ht 5\' 2"  (1.575 m)  Wt 173 lb (78.472 kg)  BMI 31.64 kg/m2    General: Well-nourished, well-developed in no acute distress.  Head: Normocephalic, atraumatic.    Eyes: Conjunctiva pink, no icterus. Mouth: Oropharyngeal mucosa moist and pink , no lesions erythema or exudate. Neck: Supple without thyromegaly, masses, or lymphadenopathy.   Lungs: Clear to auscultation bilaterally.   Heart: Regular rate and rhythm, no murmurs rubs or gallops.   Abdomen: Bowel sounds are normal, nontender, nondistended, no hepatosplenomegaly or masses, no abdominal bruits or    hernia , no rebound or guarding.    Rectal: Defer to time of TCS. Extremities: No lower extremity edema. No clubbing or deformities.   Neuro: Alert and oriented x 4 , grossly normal neurologically.   Skin: Warm and dry, no rash or  jaundice.    Psych: Alert and cooperative, normal mood and affect.       Tana Coast, PA  01/03/2011 10:23 PM  Signed Quick Note:   Labs good. Stools all negative except lactoferrin. Procedures as planned. ______  Cloria Spring, LPN, LPN  40/98/1191  8:12 AM  Signed Quick Note:   LMOM to call. ______  Glendora Score  01/04/2011  2:49 PM  Signed Cc to PCP  Cloria Spring, LPN, LPN  47/82/9562  7:58 AM  Signed Quick Note:   Pt informed. ______  Tana Coast, PA  01/17/2011  2:06 PM  Signed Discussed with Dr. Darrick Penna regarding possibility of giving antibiotics prior to procedures since patient's orthopedist had given her a card suggesting it.   Per Dr. Darrick Penna, patient should take Flagyl one gram PO one hour before procedure. Please call in RX. No refills.  Cloria Spring, LPN, LPN  13/10/6576  7:42 AM  Signed LM for pt to call.    Cloria Spring, LPN, LPN  46/96/2952  7:48 AM  Signed Pt returned call and was informed. Called [facility] [place] and LMOVM with the order.       Diarrhea - Tana Coast, PA  12/29/2010  9:31 PM  Addendum Chronic diarrhea, weight loss. H/O IBS. Need to r/o superimposed infection, consider microscopic colitis, celiac disease. Flex sig in near future.  I have discussed the risks, alternatives, benefits with regards to but not limited to the risk of reaction to medication, bleeding, infection, perforation and the patient is agreeable to proceed. Written consent to be obtained.   Stools and labs.  Previous Version  GERD (gastroesophageal reflux disease) - Tana Coast, PA  12/29/2010  9:31 PM  Addendum Refractory GERD, epigastric burning, dysphagia. EGD/ED.  I have discussed the risks, alternatives, benefits with regards to but not limited to the risk of reaction to medication, bleeding, infection, perforation and the patient is agreeable to proceed. Written consent to be obtained.   Stop omeprazole and start Dexilant

## 2011-01-24 NOTE — Interval H&P Note (Signed)
History and Physical Interval Note:   01/24/2011   9:29 AM   Kelly Wiley  has presented today for surgery, with the diagnosis of wt loss/diarrhea/dysphagia/ GERD  The various methods of treatment have been discussed with the patient and family. After consideration of risks, benefits and other options for treatment, the patient has consented to  Procedure(s): FLEXIBLE SIGMOIDOSCOPY ESOPHAGOGASTRODUODENOSCOPY (EGD) SAVORY DILATION MALONEY DILATION as a surgical intervention .  The patients' history has been reviewed, patient examined, no change in status, stable for surgery.  I have reviewed the patients' chart and labs.  Questions were answered to the patient's satisfaction.     Jonette Eva  MD  THE PATIENT WAS EXAMINED AND THERE IS NO CHANGE IN THE PATIENT'S CONDITION SINCE THE ORIGINAL H&P WAS COMPLETED.

## 2011-01-24 NOTE — Telephone Encounter (Signed)
Pt is scheduled for BPE on 01/26/11 @ 8:30- I left info with her husband ( she was asleep) - he will have her call me back to schedule EGD/ED

## 2011-01-24 NOTE — OR Nursing (Signed)
Crystal at Dr Darrick Penna Office notified to schedule barium pill esophagram and repeat EGD/ED in 2 weeks

## 2011-01-26 ENCOUNTER — Ambulatory Visit (HOSPITAL_COMMUNITY)
Admission: RE | Admit: 2011-01-26 | Discharge: 2011-01-26 | Disposition: A | Discharge status: Home / Self Care | Payer: 59 | Source: Ambulatory Visit | Attending: Gastroenterology | Admitting: Gastroenterology

## 2011-01-26 DIAGNOSIS — R197 Diarrhea, unspecified: Secondary | ICD-10-CM

## 2011-01-26 DIAGNOSIS — K224 Dyskinesia of esophagus: Secondary | ICD-10-CM | POA: Insufficient documentation

## 2011-01-26 DIAGNOSIS — R634 Abnormal weight loss: Secondary | ICD-10-CM

## 2011-01-26 DIAGNOSIS — R1310 Dysphagia, unspecified: Secondary | ICD-10-CM | POA: Insufficient documentation

## 2011-01-26 NOTE — Telephone Encounter (Signed)
Called pt to schedule repeat EGD/ED- she was unaware that this needed to be done again- and would like to wait until she comes back in the office to see Dr Darrick Penna which is on 02/08/11.

## 2011-01-30 ENCOUNTER — Telehealth: Payer: Self-pay | Admitting: Gastroenterology

## 2011-01-30 NOTE — Telephone Encounter (Signed)
Results Cc to PCP  

## 2011-01-30 NOTE — Telephone Encounter (Signed)
Please call pt. Her colon and small bowel biopsies are normal. Her stomach Bx shows gastritis. Continue DEXILANT & PROBIOTICS.  HER BARIUM SWALLOW SHOWS a MILDLY IMPAIRED ESOPHAGUS. HER CERVICAL PLATES CAN IMPEDE THE FLOW OF FOOD INTO HER ESOPHAGUS ESPECIALLY IF IT'S A LARGER PIECE OF FOOD OR FIRM. She should follow up to complete her esophageal dilation within the next 2 weeks.  OPV in MAR 2013 FOR DYSPHAGIA & DIARRHEA. TCS ON 2018.

## 2011-01-31 ENCOUNTER — Encounter (HOSPITAL_COMMUNITY): Payer: Self-pay | Admitting: Gastroenterology

## 2011-01-31 NOTE — Telephone Encounter (Signed)
Reminder in epic to follow up in March 2013 and tcs in 2018

## 2011-01-31 NOTE — Telephone Encounter (Signed)
LMOM to call.

## 2011-01-31 NOTE — Telephone Encounter (Signed)
Pt returned call and was informed. She is aware that Crystal will schedule the appt for dilation.

## 2011-02-01 ENCOUNTER — Other Ambulatory Visit: Payer: Self-pay | Admitting: Gastroenterology

## 2011-02-01 NOTE — Telephone Encounter (Signed)
Pt is scheduled for 02/20/11- instructions mailed

## 2011-02-08 ENCOUNTER — Ambulatory Visit: Payer: 59 | Admitting: Gastroenterology

## 2011-02-16 NOTE — Progress Notes (Signed)
REVIEWED.  

## 2011-02-22 ENCOUNTER — Encounter (HOSPITAL_COMMUNITY): Payer: Self-pay | Admitting: Pharmacy Technician

## 2011-02-23 ENCOUNTER — Telehealth: Payer: Self-pay | Admitting: Gastroenterology

## 2011-02-23 NOTE — Telephone Encounter (Signed)
HOLD glipiZIDE ON AM OF PROCEDURE. CONTINUE METFORMIN.

## 2011-02-23 NOTE — Telephone Encounter (Signed)
Pt is aware of diabetic instructions

## 2011-02-23 NOTE — Telephone Encounter (Signed)
Gastroenterology Pre-Procedure Form  Request Date: 03/03/11,  Requesting Physician:      PATIENT INFORMATION:  Kelly Wiley is a 55 y.o., female (DOB=[birth year]).  PROCEDURE: Procedure(s) requested: endoscopy Procedure Reason: dysphagia   PATIENT REVIEW QUESTIONS: The patient reports the following:   1. Diabetes Melitis: yes  2. Joint replacements in the past 12 months: no 3. Major health problems in the past 3 months: no 4. Has an artificial valve or MVP:no 5. Has been advised in past to take antibiotics in advance of a procedure like teeth cleaning: no}    MEDICATIONS & ALLERGIES:    Patient reports the following regarding taking any blood thinners:   Plavix? no Aspirin?no Coumadin?  no  Patient confirms/reports the following medications:  Current Outpatient Prescriptions  Medication Sig Dispense Refill  . aspirin-acetaminophen-caffeine (EXCEDRIN MIGRAINE) 250-250-65 MG per tablet Take 1 tablet by mouth every 6 (six) hours as needed. headache       . diclofenac (VOLTAREN) 75 MG EC tablet Take 75 mg by mouth 2 (two) times daily as needed. For inflammation      . gemfibrozil (LOPID) 600 MG tablet Take 1,200 mg by mouth daily.       Marland Kitchen glipiZIDE (GLUCOTROL) 5 MG tablet Take 5 mg by mouth 2 (two) times daily before a meal.        . HYDROcodone-acetaminophen (LORTAB) 10-500 MG per tablet Take 1 tablet by mouth every 6 (six) hours as needed. For pain      . hyoscyamine (LEVSIN, ANASPAZ) 0.125 MG tablet Take 0.125 mg by mouth every 4 (four) hours as needed. Gi pain      . loratadine (CLARITIN) 10 MG tablet Take 10 mg by mouth daily as needed. For allergies      . losartan (COZAAR) 50 MG tablet Take 50 mg by mouth daily.        Marland Kitchen LYRICA 50 MG capsule Take 100 mg by mouth at bedtime.       . metFORMIN (GLUCOPHAGE) 500 MG tablet Take 500 mg by mouth 3 (three) times daily.        . promethazine (PHENERGAN) 25 MG tablet Take 25 mg by mouth every 6 (six) hours as needed. For  nausea/vomiting. Only takes when she takes her pain medication      . triamterene-hydrochlorothiazide (MAXZIDE-25) 37.5-25 MG per tablet Take 1 tablet by mouth daily as needed. Blood pressure/fluid retention        Patient confirms/reports the following allergies:  Allergies  Allergen Reactions  . Dilantin Other (See Comments)    Killed white blood cell at age of 58   . Iodine 131 Other (See Comments)    Blacked out   . Sulfa Antibiotics Other (See Comments)    Bones   . Zarontin Other (See Comments)    Does not know     Patient is appropriate to schedule for requested procedure(s): yes    No orders of the defined types were placed in this encounter.    SCHEDULE INFORMATION: Procedure has been scheduled as follows:  Date: 03/03/11, Time:   Location: [facility]   This Gastroenterology Pre-Precedure Form is being routed to the following provider(s) for review: Jonette Eva, MD

## 2011-03-02 MED ORDER — SODIUM CHLORIDE 0.45 % IV SOLN
Freq: Once | INTRAVENOUS | Status: AC
  Administered 2011-03-03: 09:00:00 via INTRAVENOUS

## 2011-03-03 ENCOUNTER — Encounter (HOSPITAL_COMMUNITY)
Admission: RE | Disposition: A | Discharge status: Home / Self Care | Payer: Self-pay | Source: Ambulatory Visit | Attending: Gastroenterology

## 2011-03-03 ENCOUNTER — Ambulatory Visit (HOSPITAL_COMMUNITY)
Admission: RE | Admit: 2011-03-03 | Discharge: 2011-03-03 | Disposition: A | Discharge status: Home / Self Care | Payer: 59 | Source: Ambulatory Visit | Attending: Gastroenterology | Admitting: Gastroenterology

## 2011-03-03 ENCOUNTER — Encounter (HOSPITAL_COMMUNITY): Payer: Self-pay | Admitting: *Deleted

## 2011-03-03 DIAGNOSIS — R1310 Dysphagia, unspecified: Secondary | ICD-10-CM | POA: Insufficient documentation

## 2011-03-03 DIAGNOSIS — E119 Type 2 diabetes mellitus without complications: Secondary | ICD-10-CM | POA: Insufficient documentation

## 2011-03-03 DIAGNOSIS — Z7982 Long term (current) use of aspirin: Secondary | ICD-10-CM | POA: Insufficient documentation

## 2011-03-03 DIAGNOSIS — I10 Essential (primary) hypertension: Secondary | ICD-10-CM | POA: Insufficient documentation

## 2011-03-03 DIAGNOSIS — K2970 Gastritis, unspecified, without bleeding: Secondary | ICD-10-CM | POA: Insufficient documentation

## 2011-03-03 DIAGNOSIS — Z79899 Other long term (current) drug therapy: Secondary | ICD-10-CM | POA: Insufficient documentation

## 2011-03-03 DIAGNOSIS — Z01812 Encounter for preprocedural laboratory examination: Secondary | ICD-10-CM | POA: Insufficient documentation

## 2011-03-03 DIAGNOSIS — K2990 Gastroduodenitis, unspecified, without bleeding: Secondary | ICD-10-CM | POA: Insufficient documentation

## 2011-03-03 HISTORY — PX: ESOPHAGOGASTRODUODENOSCOPY: SHX1529

## 2011-03-03 LAB — GLUCOSE, CAPILLARY

## 2011-03-03 SURGERY — ESOPHAGOGASTRODUODENOSCOPY (EGD) WITH ESOPHAGEAL DILATION
Anesthesia: Moderate Sedation

## 2011-03-03 MED ORDER — MIDAZOLAM HCL 5 MG/5ML IJ SOLN
INTRAMUSCULAR | Status: AC
  Filled 2011-03-03: qty 10

## 2011-03-03 MED ORDER — MINERAL OIL PO OIL
TOPICAL_OIL | ORAL | Status: AC
  Filled 2011-03-03: qty 30

## 2011-03-03 MED ORDER — MEPERIDINE HCL 100 MG/ML IJ SOLN
INTRAMUSCULAR | Status: DC | PRN
  Administered 2011-03-03: 25 mg via INTRAVENOUS
  Administered 2011-03-03: 50 mg via INTRAVENOUS

## 2011-03-03 MED ORDER — BUTAMBEN-TETRACAINE-BENZOCAINE 2-2-14 % EX AERO
INHALATION_SPRAY | CUTANEOUS | Status: DC | PRN
  Administered 2011-03-03: 2 via TOPICAL

## 2011-03-03 MED ORDER — STERILE WATER FOR IRRIGATION IR SOLN
Status: DC | PRN
  Administered 2011-03-03: 10:00:00

## 2011-03-03 MED ORDER — MIDAZOLAM HCL 5 MG/5ML IJ SOLN
INTRAMUSCULAR | Status: DC | PRN
  Administered 2011-03-03 (×2): 2 mg via INTRAVENOUS

## 2011-03-03 MED ORDER — MEPERIDINE HCL 100 MG/ML IJ SOLN
INTRAMUSCULAR | Status: AC
  Filled 2011-03-03: qty 2

## 2011-03-03 MED ORDER — PROMETHAZINE HCL 25 MG/ML IJ SOLN
INTRAMUSCULAR | Status: DC | PRN
  Administered 2011-03-03: 12.5 mg via INTRAVENOUS

## 2011-03-03 MED ORDER — PROMETHAZINE HCL 25 MG/ML IJ SOLN
INTRAMUSCULAR | Status: AC
  Filled 2011-03-03: qty 1

## 2011-03-03 NOTE — Op Note (Signed)
[facility] [address] [place], [place]  [address]  ENDOSCOPY PROCEDURE REPORT  PATIENT:  Kelly Wiley, Kelly Wiley  MR#:  [id number] BIRTHDATE:  [birth year], 55 yrs. old  GENDER:  female  ENDOSCOPIST:  Jonette Eva, MD ASSISTANT: Referred by:  Selinda Flavin, M.D.  PROCEDURE DATE:  03/03/2011 PROCEDURE:  EGD with dilatation over guidewire ASA CLASS: INDICATIONS:  DYSPHAGIA  MEDICATIONS:   Demerol 75 mg IV, Versed 4 mg IV, promethazine (Phenergan) 12.5 mg IV TOPICAL ANESTHETIC:  Cetacaine Spray  DESCRIPTION OF PROCEDURE:   After the risks benefits and alternatives of the procedure were thoroughly explained, informed consent was obtained.  The EG-[id number] ([id number]) endoscope was introduced through the mouth and advanced to the second portion of the duodenum.  The instrument was slowly withdrawn as the mucosa was carefully examined.  Prior to withdrawal of the scope, the guidwire was placed.  The esophagus was dilated successfully.  The patient was recovered in endoscopy and discharged home in satisfactory condition. <<PROCEDUREIMAGES>>  Mild gastritis was found.    Dilation was then performed at the proximal esophagus DUE TO C/O DYSPHAGIA. NL DUODENUM.  1) Dilator:  Savary over guidewire  Size(s):  12.8 TO 16 MM Resistance:  moderate  Heme:  none Appearance:  COMPLICATIONS:  None  ENDOSCOPIC IMPRESSION: 1) Mild gastritis 2) DYSPHAGIA 2o TO CERVICAL PLATES IN PT'S NECK AND MILD ESO MOTILITY DISORDER. RECOMMENDATIONS: CONTINUE DEXILANT OPV MAR 2013  REPEAT EXAM:  No  ______________________________ Jonette Eva, MD  CC:  n. eSIGNED:   Kel Senn at 03/03/2011 10:27 AM  [place] Skye, [phone]

## 2011-03-03 NOTE — H&P (Signed)
Primary Care Physician:  Criss Rosales, MD Primary Gastroenterologist:  Dr. Darrick Penna  Pre-Procedure History & Physical: HPI:  Kelly Wiley is a 55 y.o. female here for DYSPHAGIA, SWALLOWINGPROBLEMS IMPROVED WITH LAST DILATION BUT NIT RESOLVED. NO PROBLEMS WITH SEDATION.  Past Medical History  Diagnosis Date  . Fibromyalgia   . Cervical spine disease   . Seasonal allergies   . DM (diabetes mellitus)   . HTN (hypertension)   . Hypercholesteremia   . GERD (gastroesophageal reflux disease)   . PONV (postoperative nausea and vomiting)   . Seizures     last one > 20 years ago  . Dysphagia     Past Surgical History  Procedure Date  . Cervical spine surgery APR 2012  . Carpal tunnel release RIGHT  . Shoulder surgery RIGHT  . Colonoscopy 2008 [facility]     [facility]  . Tubal ligation   . Flexible sigmoidoscopy 01/24/2011    Procedure: FLEXIBLE SIGMOIDOSCOPY;  Surgeon: Arlyce Harman, MD;  [facility]: [facility];  Service: Endoscopy;  Laterality: N/A;  9:30  . Esophagogastroduodenoscopy 01/24/2011    Procedure: ESOPHAGOGASTRODUODENOSCOPY (EGD);  Surgeon: Arlyce Harman, MD;  Location: [facility];  Service: Endoscopy;  Laterality: N/A;  . Savory dilation 01/24/2011    Procedure: SAVORY DILATION;  Surgeon: Arlyce Harman, MD;  Location: [facility];  Service: Endoscopy;  Laterality: N/A;    Prior to Admission medications   Medication Sig Start Date End Date Taking? Authorizing Provider  aspirin-acetaminophen-caffeine (EXCEDRIN MIGRAINE) [phone] MG per tablet Take 1 tablet by mouth every 6 (six) hours as needed. headache    Yes Historical Provider, MD  diclofenac (VOLTAREN) 75 MG EC tablet Take 75 mg by mouth 2 (two) times daily as needed. For inflammation   Yes Historical Provider, MD  gemfibrozil (LOPID) 600 MG tablet Take 1,200 mg by mouth daily.  10/23/10  Yes Historical Provider, MD  glipiZIDE (GLUCOTROL) 5 MG tablet Take 5 mg by mouth 2 (two) times daily before a meal.     Yes  Historical Provider, MD  HYDROcodone-acetaminophen (LORTAB) 10-500 MG per tablet Take 1 tablet by mouth every 6 (six) hours as needed. For pain   Yes Historical Provider, MD  losartan (COZAAR) 50 MG tablet Take 50 mg by mouth daily.     Yes Historical Provider, MD  LYRICA 50 MG capsule Take 100 mg by mouth at bedtime.  12/22/10  Yes Historical Provider, MD  metFORMIN (GLUCOPHAGE) 500 MG tablet Take 500 mg by mouth 3 (three) times daily.     Yes Historical Provider, MD  promethazine (PHENERGAN) 25 MG tablet Take 25 mg by mouth every 6 (six) hours as needed. For nausea/vomiting. Only takes when she takes her pain medication   Yes Historical Provider, MD  hyoscyamine (LEVSIN, ANASPAZ) 0.125 MG tablet Take 0.125 mg by mouth every 4 (four) hours as needed. Gi pain    Historical Provider, MD  loratadine (CLARITIN) 10 MG tablet Take 10 mg by mouth daily as needed. For allergies    Historical Provider, MD  triamterene-hydrochlorothiazide (MAXZIDE-25) 37.5-25 MG per tablet Take 1 tablet by mouth daily as needed. Blood pressure/fluid retention    Historical Provider, MD    Allergies as of 02/01/2011 - Review Complete 01/24/2011  Allergen Reaction Noted  . Dilantin Other (See Comments) 07/21/2010  . Iodine 131 Other (See Comments) 07/21/2010  . Sulfa antibiotics Other (See Comments) 07/21/2010  . Zarontin Other (See Comments) 07/21/2010    Family History  Problem Relation Age of Onset  . Colon cancer Neg Hx   . Colon polyps Neg Hx     History   Social History  . Marital Status: Married    Spouse Name: N/A    Number of Children: N/A  . Years of Education: N/A   Occupational History  . Not on file.   Social History Main Topics  . Smoking status: Former Smoker -- 0.5 packs/day for 2 years  . Smokeless tobacco: Not on file  . Alcohol Use: Yes     occasionally  . Drug Use: No  . Sexually Active: Not on file   Other Topics Concern  . 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 Neck:  Supple; Lungs:  Clear throughout to auscultation.    Heart:  Regular rate and rhythm. Abdomen:  Soft, nontender and nondistended. Normal bowel sounds, without guarding, and without rebound.   Neurologic:  Alert and  oriented x4;  grossly normal neurologically.  Impression/Plan:   DYSPHAGIA  PLAN: EGD/DIL TODAY

## 2011-04-11 ENCOUNTER — Encounter: Payer: Self-pay | Admitting: Gastroenterology

## 2011-04-12 ENCOUNTER — Ambulatory Visit: Payer: 59 | Admitting: Gastroenterology

## 2011-05-24 ENCOUNTER — Encounter: Payer: Self-pay | Admitting: Gastroenterology

## 2011-05-24 ENCOUNTER — Ambulatory Visit (INDEPENDENT_AMBULATORY_CARE_PROVIDER_SITE_OTHER): Payer: 59 | Admitting: Gastroenterology

## 2011-05-24 DIAGNOSIS — R110 Nausea: Secondary | ICD-10-CM | POA: Insufficient documentation

## 2011-05-24 DIAGNOSIS — R197 Diarrhea, unspecified: Secondary | ICD-10-CM

## 2011-05-24 MED ORDER — PROMETHAZINE HCL 25 MG PO TABS: 25.0000 mg | ORAL_TABLET | Freq: Four times a day (QID) | ORAL | Status: DC | PRN

## 2011-05-24 NOTE — Progress Notes (Signed)
Subjective:    Patient ID: Kelly Wiley, female    DOB: [birth year], 56 y.o.   MRN: [id number]  PCP: [facility]  HPI LAST SEEN IN OFC SEP 2012: 173 LBs. OCT/NOV 2012: Colon and small bowel biopsies are normal. Her stomach Bx showED gastritis. She was asked to continue DEXILANT & PROBIOTICS. HER BARIUM SWALLOW SHOWed a MILDLY IMPAIRED ESOPHAGUS. HER CERVICAL PLATES were felt to be IMPEDing THE FLOW OF FOOD INTO HER ESOPHAGUS ESPECIALLY IF IT'S A LARGER PIECE OF FOOD OR FIRM. She completed dilation to 16 mm.   Bouts of diarrhea still: 2x/mo-"pretty bad"-rectal urgency. Vacation SEP 2012. LEVSIN NONE RECENTLY. USES LOMOTIL AS WELL. On vacation took Lomtil 3x/day. Nausea: daily. No vomiting. Never use promethazine for nausea. Uses it only when she takes Vicodin. Trying to lose weight. Changes in diet. Per her scales; she's lost from 180 lbs to 189 lbs. Has limited fish and eggs because she's nauseous.  Past Medical History  Diagnosis Date  . Fibromyalgia   . Cervical spine disease   . Seasonal allergies   . DM (diabetes mellitus)   . HTN (hypertension)   . Hypercholesteremia   . GERD (gastroesophageal reflux disease)   . PONV (postoperative nausea and vomiting)   . Seizures     last one > 20 years ago  . Dysphagia     Past Surgical History  Procedure Date  . Cervical spine surgery APR 2012  . Carpal tunnel release RIGHT  . Shoulder surgery RIGHT  . Colonoscopy 2008 [facility]     [place] [facility]  . Tubal ligation   . Flexible sigmoidoscopy 01/24/2011    Procedure: FLEXIBLE SIGMOIDOSCOPY;  Surgeon: Arlyce Harman, MD;  [facility];  Service: Endoscopy;  Laterality: N/A;  9:30  . Esophagogastroduodenoscopy 01/24/2011    Procedure: ESOPHAGOGASTRODUODENOSCOPY (EGD);  Surgeon: Arlyce Harman, MD;  [facility];  Service: Endoscopy;  Laterality: N/A;  . Savory dilation 01/24/2011    Procedure: SAVORY DILATION;  Surgeon: Arlyce Harman, MD;  [facility];  Service:  Endoscopy;  Laterality: N/A;  . Esophagogastroduodenoscopy 03/03/11    mild gastritis    Allergies  Allergen Reactions  . Dilantin Other (See Comments)    Killed white blood cell at age of 38   . Iodine 131 Other (See Comments)    Blacked out   . Sulfa Antibiotics Other (See Comments)    Bones   . Zarontin Other (See Comments)    Does not know     Current Outpatient Prescriptions  Medication Sig Dispense Refill  . aspirin-acetaminophen-caffeine (EXCEDRIN MIGRAINE) 250-250-65 MG per tablet Take 1 tablet by mouth every 6 (six) hours as needed. headache       . DEXILANT 60 MG capsule Take 60 mg by mouth daily.       . diclofenac (VOLTAREN) 75 MG EC tablet Take 75 mg by mouth 2 (two) times daily as needed. For inflammation      . gemfibrozil (LOPID) 600 MG tablet Take 1,200 mg by mouth daily.       Marland Kitchen glipiZIDE (GLUCOTROL) 5 MG tablet Take 5 mg by mouth 2 (two) times daily before a meal.        . HYDROcodone-acetaminophen (LORTAB) 10-500 MG per tablet Take 1 tablet by mouth every 6 (six) hours as needed. For pain      . hyoscyamine (LEVSIN, ANASPAZ) 0.125 MG tablet Take 0.125 mg by mouth every 4 (four) hours as needed. Gi pain      .  loratadine (CLARITIN) 10 MG tablet Take 10 mg by mouth daily as needed. For allergies      . losartan (COZAAR) 50 MG tablet Take 50 mg by mouth daily.        Marland Kitchen LYRICA 50 MG capsule Take 100 mg by mouth at bedtime.       . metFORMIN (GLUCOPHAGE) 500 MG tablet Take 500 mg by mouth 3 (three) times daily.        . promethazine (PHENERGAN) 25 MG tablet Take 25 mg by mouth every 6 (six) hours as needed. For nausea/vomiting. Only takes when she takes her pain medication      . triamterene-hydrochlorothiazide (MAXZIDE-25) 37.5-25 MG per tablet Take 1 tablet by mouth daily as needed. Blood pressure/fluid retention           Review of Systems     Objective:   Physical Exam  Vitals reviewed. Constitutional: She is oriented to person, place, and time. She  appears well-developed and well-nourished. No distress.  HENT:  Head: Normocephalic and atraumatic.  Mouth/Throat: No oropharyngeal exudate.  Eyes: Pupils are equal, round, and reactive to light. No scleral icterus.  Neck: Normal range of motion. Neck supple.  Cardiovascular: Normal rate, regular rhythm and normal heart sounds.   Pulmonary/Chest: Effort normal and breath sounds normal. No respiratory distress.  Abdominal: Soft. Bowel sounds are normal. She exhibits no distension. There is no tenderness. There is no rebound.       OBESE   Musculoskeletal: Normal range of motion. She exhibits no edema.  Lymphadenopathy:    She has no cervical adenopathy.  Neurological: She is alert and oriented to person, place, and time.       NO FOCAL DEFICITS   Psychiatric:       ANXIOUS MOOD NL AFFECT          Assessment & Plan:

## 2011-05-24 NOTE — Patient Instructions (Addendum)
FOLLOW A GASTROPARESIS/DIABETIC DIET. See handout. USE PROMETHAZINE as needed.  FOLLOW IN 4 MOS. IF your symptoms CONTINUE, I  WILL NEED TO ORDER THE GASTRIC EMPTYING STUDY TO SEE IF YOU NEED REGLAN FOR BETTER SYMPTOM CONTROL.

## 2011-05-24 NOTE — Assessment & Plan Note (Signed)
MOST LIKELY DUE TO IBS-D, MEDS, AND/OR DIABETIC ENTEROPATHY.  MAY USE LOMOTIL OR LEVSIN PRN FOR DIARRHEA. OPV IN 4 MOS. CONTINUE DIET MODIFICATION/WEIGHT LOSS EFFORTS.

## 2011-05-24 NOTE — Progress Notes (Signed)
Faxed to PCP

## 2011-05-24 NOTE — Assessment & Plan Note (Signed)
LIKELY DUE TO DM GASTROPRESIS, DIFFERENTIAL INCLUDES BREAKTHROUGH REFLUX, MEDS, PSYCHOSOMATIC DISORDER.  DISCUSSED MANAGEMENT OPTIONS WITH PT/HUSBAND: 1. GES--> ?REGLAN OR 2. TRY GASTROPARESIS DIET & IF FAILS TO IMPROVE Sx, GES-->?REGLAN. PT ELECTED TO TRY DIET FIRST. PT CONCERNED ABOUT COST OF MEDICAL BILLS. PHENERGAN PRN. CONTINUE DIET MODIFICATION: LOW FAT/DIABETIC/GASTROPARESIS DIET. OP IN 4 MOS.

## 2011-06-01 NOTE — Progress Notes (Signed)
19m f/u reminder in Epic

## 2011-06-21 DIAGNOSIS — R079 Chest pain, unspecified: Secondary | ICD-10-CM

## 2011-07-11 ENCOUNTER — Other Ambulatory Visit: Payer: Self-pay | Admitting: Gastroenterology

## 2011-08-17 ENCOUNTER — Telehealth: Payer: Self-pay

## 2011-08-17 NOTE — Telephone Encounter (Signed)
Received a paper via fax with no phone or fax number or place faxed from. Said per insurance if pt was chaning from Dexilant to Pantoprazole to send in new prescription. I faxed it to [phone] with a note saying the form was faxed to them on 07/28/2011. ( See scanned copy) it said do not send a prescription and the form was completed).

## 2011-08-21 ENCOUNTER — Telehealth: Payer: Self-pay | Admitting: Gastroenterology

## 2011-08-21 NOTE — Telephone Encounter (Signed)
rx for #30 called to [facility].

## 2011-08-21 NOTE — Telephone Encounter (Signed)
Pt and her husband are at front window with questions about paperwork that was faxed regarding her prescriptions. They are in waiting room waiting to speak with someone.

## 2011-08-21 NOTE — Telephone Encounter (Signed)
Spoke with pt- they need a 1 month supply of pantoprazole called into [facility] for pt to try and see if it works. (they have spoken with insurance company and they said this was ok ) if it works good, pt will need 3 month supply [facility]. If it doesn't work she wants to go back on dexilant because it worked good.   There is a paper under the media tab where pt was switched from dexilant to pantoprazole to save money.

## 2011-09-12 ENCOUNTER — Other Ambulatory Visit: Payer: Self-pay

## 2011-09-12 MED ORDER — PANTOPRAZOLE SODIUM 40 MG PO TBEC: 40.0000 mg | DELAYED_RELEASE_TABLET | Freq: Every day | ORAL | Status: DC

## 2013-02-04 ENCOUNTER — Ambulatory Visit: Payer: 59 | Admitting: Gastroenterology

## 2013-03-25 ENCOUNTER — Telehealth: Payer: Self-pay | Admitting: Gastroenterology

## 2013-03-25 ENCOUNTER — Ambulatory Visit: Payer: 59 | Admitting: Gastroenterology

## 2013-03-25 NOTE — Telephone Encounter (Signed)
Pt was a no show

## 2014-10-08 ENCOUNTER — Encounter: Payer: Self-pay | Admitting: *Deleted

## 2014-10-12 ENCOUNTER — Encounter: Payer: Self-pay | Admitting: Cardiovascular Disease

## 2014-10-12 ENCOUNTER — Ambulatory Visit (INDEPENDENT_AMBULATORY_CARE_PROVIDER_SITE_OTHER): Payer: 59 | Admitting: Cardiovascular Disease

## 2014-10-12 VITALS — BP 148/100 | HR 92 | Ht 62.0 in | Wt 179.0 lb

## 2014-10-12 DIAGNOSIS — R55 Syncope and collapse: Secondary | ICD-10-CM

## 2014-10-12 DIAGNOSIS — Z87898 Personal history of other specified conditions: Secondary | ICD-10-CM

## 2014-10-12 DIAGNOSIS — E785 Hyperlipidemia, unspecified: Secondary | ICD-10-CM | POA: Diagnosis not present

## 2014-10-12 DIAGNOSIS — I10 Essential (primary) hypertension: Secondary | ICD-10-CM

## 2014-10-12 DIAGNOSIS — Z9289 Personal history of other medical treatment: Secondary | ICD-10-CM

## 2014-10-12 MED ORDER — EZETIMIBE 10 MG PO TABS: 10.0000 mg | ORAL_TABLET | Freq: Every day | ORAL | Status: DC

## 2014-10-12 MED ORDER — AMLODIPINE BESYLATE 2.5 MG PO TABS: 2.5000 mg | ORAL_TABLET | Freq: Every day | ORAL | Status: DC

## 2014-10-12 MED ORDER — LOSARTAN POTASSIUM 50 MG PO TABS: 50.0000 mg | ORAL_TABLET | Freq: Two times a day (BID) | ORAL | Status: DC

## 2014-10-12 NOTE — Patient Instructions (Addendum)
   Stop HCTZ.  Increase Losartan to 50mg  twice a day - states she has plenty at home. No new script sent today at patient request.  Begin Amlodipine 2.5mg  daily.  Begin Zetia 10mg  daily.  New prescriptions sent to [facility] today.  If tolerate, patient to notify office when want to send for mail order. Continue all other medications.   Lab for Lipids - Due in 3 months (October).  Will mail reminder. Follow up in  3 months.

## 2014-10-12 NOTE — Progress Notes (Signed)
Patient ID: Kelly Wiley, female   DOB: Aug 29, 1955, 59 y.o.   MRN: [id number]       CARDIOLOGY CONSULT NOTE  Patient ID: Kelly Wiley MRN: [id number] DOB/AGE: 1955-08-09 59 y.o.  Admit date: (Not on file) Primary Physician Rory Percy, MD  Reason for Consultation: syncope, HTN  HPI: The patient is a 59 year old woman who is referred for the evaluation of syncope. She has a history of hypertension, hyperlipidemia, and diabetes. She has previously been evaluated in the ED at [facility] and was once hospitalized for 4 days, but these records are presently unavailable to me. She was told she was dehydrated.  Labs on 09/03/14 showed total cholesterol 235, triglycerides 250, HDL 49, LDL 136. Other labs include hemoglobin 14.6, BUN 21, creatinine 0.76, AST 18, ALT 18.  During one episode of syncope, she thinks she lost consciousness for 4 minutes. She did not lose consciousness during her most recent episode. She has been having problems with fluctuating blood pressure. She said her blood pressure was very low which led to her syncopal episode in January and was "53/45" in May when checked by her son.   She denies a history of chest pain, palpitations, leg swelling, shortness of breath, peripheral neuropathy, and kidney disease. She has not tolerated statins in the past.  She said her most recent glycosylated hemoglobin was 7.7% and was previously 6.5%.   ECG performed in the office today demonstrates normal sinus rhythm with no ischemic ST segment or T-wave abnormalities, nor any arrhythmias.   Allergies  Allergen Reactions  . Ethosuximide Other (See Comments)    Does not know   . Iodine 131 Other (See Comments)    Blacked out   . Phenytoin Sodium Extended Other (See Comments)    Killed white blood cell at age of 6   . Sulfa Antibiotics Other (See Comments)    Bones     Current Outpatient Prescriptions  Medication Sig Dispense Refill  .  aspirin-acetaminophen-caffeine (EXCEDRIN MIGRAINE) 250-250-65 MG per tablet Take 1 tablet by mouth every 6 (six) hours as needed. headache     . B Complex Vitamins (B COMPLEX 1 PO) Take by mouth.    . B Complex Vitamins (B-COMPLEX/B-12 PO) Take by mouth.    . DEXILANT 60 MG capsule Take 60 mg by mouth daily.     Marland Kitchen HYDROcodone-acetaminophen (LORTAB) 10-500 MG per tablet Take 1 tablet by mouth every 6 (six) hours as needed. For pain    . hyoscyamine (LEVSIN, ANASPAZ) 0.125 MG tablet Take 0.125 mg by mouth every 4 (four) hours as needed. Gi pain    . insulin glargine (LANTUS) 100 UNIT/ML injection Inject into the skin. Sliding scale    . insulin lispro (HUMALOG) 100 UNIT/ML injection Inject into the skin. Sliding scale    . KRILL OIL PO Take by mouth. 3 once daily    . loratadine (CLARITIN) 10 MG tablet Take 10 mg by mouth daily as needed. For allergies    . losartan (COZAAR) 50 MG tablet Take 50 mg by mouth daily.      Marland Kitchen losartan-hydrochlorothiazide (HYZAAR) 50-12.5 MG per tablet Take 1 tablet by mouth daily.    . ondansetron (ZOFRAN) 8 MG tablet Take 16 mg by mouth 2 (two) times daily.    . pantoprazole (PROTONIX) 40 MG tablet Take 1 tablet (40 mg total) by mouth daily. 90 tablet 3  . pramipexole (MIRAPEX) 0.5 MG tablet Take 0.5 mg by mouth daily.  No current facility-administered medications for this visit.    Past Medical History  Diagnosis Date  . Fibromyalgia   . Cervical spine disease   . Seasonal allergies   . DM (diabetes mellitus)   . HTN (hypertension)   . Hypercholesteremia   . GERD (gastroesophageal reflux disease)   . PONV (postoperative nausea and vomiting)   . Seizures     last one > 20 years ago  . Dysphagia     Past Surgical History  Procedure Laterality Date  . Cervical spine surgery  APR 2012  . Carpal tunnel release  RIGHT  . Shoulder surgery  RIGHT  . Colonoscopy  2008 [facility]  . Tubal ligation    . Flexible sigmoidoscopy  01/24/2011     SLF: Internal Hemorrhoids  . Esophagogastroduodenoscopy  01/24/2011    BZJ:[id number] 2o TO CERVICAL PLATES IN PT'S NECK AND MILD ESO MOTILITY DISORDER/Mild gastritis  . Savory dilation  01/24/2011    Procedure: SAVORY DILATION;  Surgeon: Dorothyann Peng, MD;  [facility];  Service: Endoscopy;  Laterality: N/A;  . Esophagogastroduodenoscopy  03/03/11    OFB:[facility] gastritis     History   Social History  . Marital Status: Widowed    Spouse Name: N/A  . Number of Children: N/A  . Years of Education: N/A   Occupational History  . Not on file.   Social History Main Topics  . Smoking status: Former Smoker -- 0.50 packs/day for 2 years    Types: Cigarettes    Quit date: 10/11/1968  . Smokeless tobacco: Never Used  . Alcohol Use: 0.0 oz/week    0 Standard drinks or equivalent per week     Comment: occasionally  . Drug Use: No  . Sexual Activity: Not on file   Other Topics Concern  . Not on file   Social History Narrative   MARRIED, 2 KIDS, YOUNGEST 82 YO.   Son has DM, malabsorption   Works in Press photographer in [place]   EtOH: rare   No tobacco products     No family history of premature CAD in 1st degree relatives.  Prior to Admission medications   Medication Sig Start Date End Date Taking? Authorizing Provider  aspirin-acetaminophen-caffeine (EXCEDRIN MIGRAINE) [phone] MG per tablet Take 1 tablet by mouth every 6 (six) hours as needed. headache    Yes Historical Provider, MD  B Complex Vitamins (B COMPLEX 1 PO) Take by mouth.   Yes Historical Provider, MD  B Complex Vitamins (B-COMPLEX/B-12 PO) Take by mouth.   Yes Historical Provider, MD  DEXILANT 60 MG capsule Take 60 mg by mouth daily.  05/22/11  Yes Historical Provider, MD  HYDROcodone-acetaminophen (LORTAB) 10-500 MG per tablet Take 1 tablet by mouth every 6 (six) hours as needed. For pain   Yes Historical Provider, MD  hyoscyamine (LEVSIN, ANASPAZ) 0.125 MG tablet Take 0.125 mg by mouth every 4 (four) hours  as needed. Gi pain   Yes Historical Provider, MD  insulin glargine (LANTUS) 100 UNIT/ML injection Inject into the skin. Sliding scale   Yes Historical Provider, MD  insulin lispro (HUMALOG) 100 UNIT/ML injection Inject into the skin. Sliding scale   Yes Historical Provider, MD  KRILL OIL PO Take by mouth. 3 once daily   Yes Historical Provider, MD  loratadine (CLARITIN) 10 MG tablet Take 10 mg by mouth daily as needed. For allergies   Yes Historical Provider, MD  losartan (COZAAR) 50 MG tablet Take 50  mg by mouth daily.     Yes Historical Provider, MD  losartan-hydrochlorothiazide (HYZAAR) 50-12.5 MG per tablet Take 1 tablet by mouth daily.   Yes Historical Provider, MD  ondansetron (ZOFRAN) 8 MG tablet Take 16 mg by mouth 2 (two) times daily.   Yes Historical Provider, MD  pantoprazole (PROTONIX) 40 MG tablet Take 1 tablet (40 mg total) by mouth daily. 09/12/11 10/12/14 Yes Mahala Menghini, PA-C  pramipexole (MIRAPEX) 0.5 MG tablet Take 0.5 mg by mouth daily.   Yes Historical Provider, MD     Review of systems complete and found to be negative unless listed above in HPI     Physical exam Blood pressure 148/100, pulse 92, height 5\' 2"  (1.575 m), weight 179 lb (81.194 kg), SpO2 97 %. General: NAD Neck: No JVD, no thyromegaly or thyroid nodule.  Lungs: Clear to auscultation bilaterally with normal respiratory effort. CV: Nondisplaced PMI. Regular rate and rhythm, normal S1/S2, no S3/S4, no murmur.  No peripheral edema.  No carotid bruit.  Normal pedal pulses.  Abdomen: Soft, nontender, no hepatosplenomegaly, no distention.  Skin: Intact without lesions or rashes.  Neurologic: Alert and oriented x 3.  Psych: Normal affect. Extremities: No clubbing or cyanosis.  HEENT: Normal.   ECG: Most recent ECG reviewed.  Labs:   Lab Results  Component Value Date   WBC 7.0 12/28/2010   HGB 14.5 12/28/2010   HCT 43.0 12/28/2010   MCV 88.5 12/28/2010   PLT 231 12/28/2010   No results for  input(s): NA, K, CL, CO2, BUN, CREATININE, CALCIUM, PROT, BILITOT, ALKPHOS, ALT, AST, GLUCOSE in the last 168 hours.  Invalid input(s): LABALBU No results found for: CKTOTAL, CKMB, CKMBINDEX, TROPONINI No results found for: CHOL No results found for: HDL No results found for: LDLCALC No results found for: TRIG No results found for: CHOLHDL No results found for: LDLDIRECT       Studies: No results found.  ASSESSMENT AND PLAN:  1. Syncope: Possibly due to autonomic neuropathy given diabetes and hypotension, which appears to precipitate episodes. If frequent recurrences, would consider event monitoring. Will obtain [facility] records.  2. Essential HTN: Elevated today. Given prior history of dehydration leading to hypotension and syncope, will d/c HCTZ. Will continue losartan 50 mg bid and add very low-dose amlodipine 2.5 mg daily.  3. Hyperlipidemia: Markedly elevated particularly in context of diabetes. Statin intolerant. Will start Zetia 10 mg and repeat lipids in 3 months in an effort to modify cardiovascular risk factors.  Dispo: f/u 3 months.   Signed: Kate Sable, M.D., F.A.C.C.  10/12/2014, 1:20 PM

## 2014-10-13 NOTE — Addendum Note (Signed)
Addended by: Laurine Blazer on: 10/13/2014 08:37 AM   Modules accepted: Orders

## 2014-10-16 ENCOUNTER — Other Ambulatory Visit: Payer: Self-pay | Admitting: *Deleted

## 2014-10-16 MED ORDER — AMLODIPINE BESYLATE 2.5 MG PO TABS: 2.5000 mg | ORAL_TABLET | Freq: Every day | ORAL | Status: DC

## 2014-10-22 ENCOUNTER — Encounter: Payer: Self-pay | Admitting: *Deleted

## 2014-10-30 ENCOUNTER — Telehealth: Payer: Self-pay | Admitting: Cardiovascular Disease

## 2014-10-30 NOTE — Telephone Encounter (Signed)
BP 150s/100s HR 90-110 since LOV. This 140/90 HR 107. Pt feel like "her heart is pounding" off and on. Has no symptoms today. Denies chest pain, dizziness, SOB, swelling. Also cannot afford Zetia $200/month. Will forward to Dr. Bronson Ing, pt aware that may not get a response today, and will report to ED if symptoms worsen or develop new symptoms as discussed.

## 2014-10-30 NOTE — Telephone Encounter (Signed)
HAVING BP running high lately.  Concerned wanted to know if it could be medication

## 2014-11-02 NOTE — Telephone Encounter (Signed)
Left message to return call (left med increase info on voice mail) - will discuss Crestor/Zetia when get return call from patient.

## 2014-11-02 NOTE — Telephone Encounter (Signed)
Increase amlodipine to 5 mg daily (I ordered 2.5 mg at clinic visit). Try Crestor 5 mg daily instead of Zetia.

## 2014-11-09 NOTE — Telephone Encounter (Signed)
Last pm - 177/111  113.  Did increase her Amlodipine on 11/01/13.  Advised her to take both tabs (2.5mg ) at same time as opposed to splitting up.  States still going up & down occasionally.  Suggested she keep BP log over the next 2 weeks and return to office for MD review.  Also, discussed the Crestor / Zetia with her.  Stated she could not take the statins so she declined the Crestor.  Offered discount card for the Zetia.  Commercially insured patients may pay as little as $25.00 per prescription.  Patient stated she will check with [facility] on this & call back.

## 2014-12-03 ENCOUNTER — Telehealth: Payer: Self-pay | Admitting: *Deleted

## 2014-12-03 ENCOUNTER — Other Ambulatory Visit: Payer: Self-pay | Admitting: *Deleted

## 2014-12-03 MED ORDER — EZETIMIBE 10 MG PO TABS: 10.0000 mg | ORAL_TABLET | Freq: Every day | ORAL | Status: DC

## 2014-12-03 NOTE — Telephone Encounter (Signed)
Patient in office today to drop off BP readings - see list below.     11/12/2014 10:00 am - 122/82  120 1:45 pm - 149/83  90 4:30 pm - 134/83  89 9:30 pm - 147/89  83  8/26 9:00 am - 141/98  88 5:23 pm - 143/89  90    8/27 8:30 am - 129/90  99      8/28 10:15 am - 128/83  103     8/29 8:00 am - 143/89  90 3:00pm - 139/89  92    8/30 9:00 am - 174/100  91 10:00pm - 126/69  94    8/31 9:00 am - 163/103  109 12:30 pm - 136/92  101 4:30 pm - 116/64  82   9/1  9:00 am - 148/93  85 1:30 pm - 121/77  103     9/2 9:30 am - 131/92  106     9/3  9:30 am - 145/90  95     9/6 9:00 am - 139/91  97 12:00 pm - 129/87  106 3:15 pm - 166/90  115 < cleaning house           Follow up is already scheduled with Dr. Bronson Ing for 01/08/2015.

## 2014-12-03 NOTE — Telephone Encounter (Signed)
Patient walked in requesting coupon for Zetia.  Printed rx & card for $25 co-pay given.

## 2014-12-04 NOTE — Telephone Encounter (Signed)
Merrilee Seashore (son) notified.

## 2014-12-04 NOTE — Telephone Encounter (Signed)
BP's reviewed, somewhat up and down but not far from goal. Can keep her f/u with Dr Bronson Ing in October to discuss further, would stay on same therapy for now  Zandra Abts MD

## 2014-12-23 ENCOUNTER — Other Ambulatory Visit: Payer: Self-pay | Admitting: *Deleted

## 2014-12-23 ENCOUNTER — Encounter: Payer: Self-pay | Admitting: *Deleted

## 2014-12-23 DIAGNOSIS — E785 Hyperlipidemia, unspecified: Secondary | ICD-10-CM

## 2014-12-31 ENCOUNTER — Telehealth: Payer: Self-pay | Admitting: *Deleted

## 2014-12-31 NOTE — Telephone Encounter (Signed)
Message left on voice mail - recently given Zetia discount card & noticed it expired in November.   Attempted to return call - left message.

## 2015-01-05 NOTE — Telephone Encounter (Signed)
2nd attempt - left information on voice mail.  She can go to [email or web address] website & print coupon to take to pharmacy herself.

## 2015-01-08 ENCOUNTER — Ambulatory Visit: Payer: 59 | Admitting: Cardiovascular Disease

## 2015-01-08 ENCOUNTER — Encounter: Payer: Self-pay | Admitting: *Deleted

## 2015-01-27 ENCOUNTER — Encounter: Payer: Self-pay | Admitting: Cardiovascular Disease

## 2015-01-27 ENCOUNTER — Ambulatory Visit (INDEPENDENT_AMBULATORY_CARE_PROVIDER_SITE_OTHER): Payer: 59 | Admitting: Cardiovascular Disease

## 2015-01-27 VITALS — BP 125/80 | HR 93 | Ht 62.0 in | Wt 186.0 lb

## 2015-01-27 DIAGNOSIS — I10 Essential (primary) hypertension: Secondary | ICD-10-CM | POA: Diagnosis not present

## 2015-01-27 DIAGNOSIS — R55 Syncope and collapse: Secondary | ICD-10-CM

## 2015-01-27 DIAGNOSIS — E785 Hyperlipidemia, unspecified: Secondary | ICD-10-CM | POA: Diagnosis not present

## 2015-01-27 MED ORDER — AMLODIPINE BESYLATE 5 MG PO TABS: 5.0000 mg | ORAL_TABLET | Freq: Every day | ORAL | Status: DC

## 2015-01-27 NOTE — Patient Instructions (Signed)
   Lab for Lipids - order given today.  Reminder:  Nothing to eat or drink after 12 midnight prior to labs. - CAN DO NEXT MONTH. Office will contact with results via phone or letter.   Your physician wants you to follow up in: 6 months.  You will receive a reminder letter in the mail one-two months in advance.  If you don't receive a letter, please call our office to schedule the follow up appointment

## 2015-01-27 NOTE — Progress Notes (Signed)
Patient ID: Kelly Wiley, female   DOB: [birth year], 59 y.o.   MRN: [id number]      SUBJECTIVE: The patient presents for follow-up of syncope, hypertension, and hyperlipidemia. She refuses statin therapy. She was given a prescription for Zetia. Amlodipine increased to 5 mg since her initial visit in July.  She is feeling well and denies chest pain, shortness of breath, and syncope. A review of labs performed on 11/10/14 showed HbA1c 7.5%, LDL 136, total cholesterol 248, triglycerides 364, HDL 39. She began taking Zetia in August. She has not been able to walk or exercise due to a left meniscal knee tear and recent back surgery.   Review of Systems: As per "subjective", otherwise negative.  Allergies  Allergen Reactions  . Ethosuximide Other (See Comments)    Does not know   . Iodine 131 Other (See Comments)    Blacked out   . Phenytoin Sodium Extended Other (See Comments)    Killed white blood cell at age of 69   . Sulfa Antibiotics Other (See Comments)    Bones     Current Outpatient Prescriptions  Medication Sig Dispense Refill  . amLODipine (NORVASC) 2.5 MG tablet Take 1 tablet (2.5 mg total) by mouth daily. 90 tablet 3  . aspirin-acetaminophen-caffeine (EXCEDRIN MIGRAINE) 916-945-03 MG per tablet Take 1 tablet by mouth every 6 (six) hours as needed. headache     . B Complex Vitamins (B COMPLEX 1 PO) Take by mouth.    . B Complex Vitamins (B-COMPLEX/B-12 PO) Take by mouth.    . baclofen (LIORESAL) 10 MG tablet Take 1 tablet by mouth 3 (three) times daily.    . DULoxetine (CYMBALTA) 60 MG capsule Take 60 mg by mouth daily.    Marland Kitchen ezetimibe (ZETIA) 10 MG tablet Take 1 tablet (10 mg total) by mouth daily. 30 tablet 11  . HYDROcodone-acetaminophen (NORCO) 10-325 MG tablet Take 1 tablet by mouth every 6 (six) hours as needed.    . hyoscyamine (LEVSIN, ANASPAZ) 0.125 MG tablet Take 0.125 mg by mouth every 4 (four) hours as needed. Gi pain    . insulin glargine (LANTUS) 100 UNIT/ML  injection Inject into the skin. Sliding scale    . insulin lispro (HUMALOG) 100 UNIT/ML injection Inject into the skin. Sliding scale    . KRILL OIL PO Take by mouth. 3 once daily    . loratadine (CLARITIN) 10 MG tablet Take 10 mg by mouth daily as needed. For allergies    . losartan (COZAAR) 50 MG tablet Take 1 tablet (50 mg total) by mouth 2 (two) times daily.    . ondansetron (ZOFRAN) 8 MG tablet Take 16 mg by mouth 2 (two) times daily.    . pantoprazole (PROTONIX) 40 MG tablet Take 1 tablet by mouth daily.    . pramipexole (MIRAPEX) 0.5 MG tablet Take 0.5 mg by mouth daily.    . promethazine (PHENERGAN) 25 MG tablet Take 1 tablet by mouth 3 (three) times daily as needed.     No current facility-administered medications for this visit.    Past Medical History  Diagnosis Date  . Fibromyalgia   . Cervical spine disease   . Seasonal allergies   . DM (diabetes mellitus) ([place])   . HTN (hypertension)   . Hypercholesteremia   . GERD (gastroesophageal reflux disease)   . PONV (postoperative nausea and vomiting)   . Seizures ([place])     last one > 20 years ago  . Dysphagia  Past Surgical History  Procedure Laterality Date  . Cervical spine surgery  APR 2012  . Carpal tunnel release  RIGHT  . Shoulder surgery  RIGHT  . Colonoscopy  2008 [place] [facility]  . Tubal ligation    . Flexible sigmoidoscopy  01/24/2011    SLF: Internal Hemorrhoids  . Esophagogastroduodenoscopy  01/24/2011    [id number] 2o TO CERVICAL PLATES IN PT'S NECK AND MILD ESO MOTILITY DISORDER/Mild gastritis  . Savory dilation  01/24/2011    Procedure: SAVORY DILATION;  Surgeon: Dorothyann Peng, MD;  [facility];  Service: Endoscopy;  Laterality: N/A;  . Esophagogastroduodenoscopy  03/03/11    [id number] gastritis     Social History   Social History  . Marital Status: Widowed    Spouse Name: N/A  . Number of Children: N/A  . Years of Education: N/A   Occupational History  . Not on file.    Social History Main Topics  . Smoking status: Former Smoker -- 0.50 packs/day for 2 years    Types: Cigarettes    Start date: 10/12/1966    Quit date: 10/11/1968  . Smokeless tobacco: Never Used  . Alcohol Use: 0.0 oz/week    0 Standard drinks or equivalent per week     Comment: occasionally  . Drug Use: No  . Sexual Activity: Not on file   Other Topics Concern  . Not on file   Social History Narrative   MARRIED, 2 KIDS, YOUNGEST 25 YO.   Son has DM, malabsorption   Works in Press photographer in [place]   EtOH: rare   No tobacco products     Filed Vitals:   01/27/15 1419  BP: 125/80  Pulse: 93  Height: 5\' 2"  (1.575 m)  Weight: 186 lb (84.369 kg)    PHYSICAL EXAM General: NAD HEENT: Normal. Neck: No JVD, no thyromegaly. Lungs: Clear to auscultation bilaterally with normal respiratory effort. CV: Nondisplaced PMI.  Regular rate and rhythm, normal S1/S2, no S3/S4, no murmur. No pretibial or periankle edema.   Abdomen: Soft, nontender, central obesity.  Neurologic: Alert and oriented x 3.  Psych: Normal affect. Skin: Normal. Musculoskeletal: No gross deformities. Extremities: No clubbing or cyanosis.   ECG: Most recent ECG reviewed.      ASSESSMENT AND PLAN: 1. Syncope: No recurrences since discontinuation of diuretic. Prior episodes possibly due to autonomic neuropathy given diabetes and hypotension, which appeared to precipitate episodes. If frequent recurrences, would consider event monitoring.   2. Essential HTN: Normal today with increase of amlodipine to 5 mg. SBP in 130 range at home. No changes.  3. Hyperlipidemia: Remain elevated particularly in context of diabetes. Statin intolerant. Will continue Zetia 10 mg and repeat lipids in December in an effort to modify cardiovascular risk factors.  Dispo: f/u 6 months.   Kate Sable, M.D., F.A.C.C.

## 2015-03-18 DIAGNOSIS — K224 Dyskinesia of esophagus: Secondary | ICD-10-CM | POA: Insufficient documentation

## 2015-05-06 ENCOUNTER — Encounter: Payer: Self-pay | Admitting: *Deleted

## 2015-05-07 ENCOUNTER — Ambulatory Visit (INDEPENDENT_AMBULATORY_CARE_PROVIDER_SITE_OTHER): Payer: 59 | Admitting: Cardiovascular Disease

## 2015-05-07 ENCOUNTER — Encounter: Payer: Self-pay | Admitting: *Deleted

## 2015-05-07 ENCOUNTER — Encounter: Payer: Self-pay | Admitting: Cardiovascular Disease

## 2015-05-07 VITALS — BP 117/79 | HR 98 | Ht 62.0 in | Wt 191.0 lb

## 2015-05-07 DIAGNOSIS — R55 Syncope and collapse: Secondary | ICD-10-CM | POA: Diagnosis not present

## 2015-05-07 DIAGNOSIS — I10 Essential (primary) hypertension: Secondary | ICD-10-CM

## 2015-05-07 DIAGNOSIS — Z9289 Personal history of other medical treatment: Secondary | ICD-10-CM

## 2015-05-07 DIAGNOSIS — Z87898 Personal history of other specified conditions: Secondary | ICD-10-CM | POA: Diagnosis not present

## 2015-05-07 DIAGNOSIS — E785 Hyperlipidemia, unspecified: Secondary | ICD-10-CM | POA: Diagnosis not present

## 2015-05-07 NOTE — Progress Notes (Signed)
Patient ID: Kelly Wiley, female   DOB: 1955-11-10, 60 y.o.   MRN: [id number]      SUBJECTIVE: The patient presents for post hospitalization follow-up from [facility]. When I last saw her in November 2016 she was stable. It appears she was hospitalized for acute hypotension related to medications and atypical chest pain. While the discharge summary states losartan-hydrochlorothiazide was discontinued, her diuretic had actually been stopped by me several months ago. She remains on Cozaar 50 mg and amlodipine 5 mg as per the discharge summary dictated 05/04/15. Initial blood pressure was 75/56 and became 90/60 with IV fluids.  The patient tells me her blood pressure had actually been very high at 195/111 prior to her hospitalization. She has checked it since being discharged and it was 139/80. She also tells me she underwent echocardiography and nuclear stress testing but these results are presently unavailable. She would like to get back to exercising.    Review of Systems: As per "subjective", otherwise negative.  Allergies  Allergen Reactions  . Ethosuximide Other (See Comments)    Does not know   . Iodine 131 Other (See Comments)    Blacked out   . Phenytoin Sodium Extended Other (See Comments)    Killed white blood cell at age of 61   . Sulfa Antibiotics Other (See Comments)    Bones     Current Outpatient Prescriptions  Medication Sig Dispense Refill  . amLODipine (NORVASC) 5 MG tablet Take 1 tablet (5 mg total) by mouth daily.    Marland Kitchen aspirin-acetaminophen-caffeine (EXCEDRIN MIGRAINE) 250-250-65 MG per tablet Take 1 tablet by mouth every 6 (six) hours as needed. headache     . B Complex Vitamins (B-COMPLEX/B-12 PO) Take 1 tablet by mouth daily.     . baclofen (LIORESAL) 10 MG tablet Take 1 tablet by mouth 3 (three) times daily.    . DULoxetine (CYMBALTA) 30 MG capsule Take 1 capsule by mouth 2 (two) times daily.    Marland Kitchen ezetimibe (ZETIA) 10 MG tablet Take 1 tablet (10 mg total) by  mouth daily. 30 tablet 11  . HYDROcodone-acetaminophen (NORCO) 10-325 MG tablet Take 1 tablet by mouth every 6 (six) hours as needed.    . hyoscyamine (LEVSIN, ANASPAZ) 0.125 MG tablet Take 0.125 mg by mouth every 4 (four) hours as needed. Gi pain    . insulin glargine (LANTUS) 100 UNIT/ML injection Inject into the skin. Sliding scale    . insulin lispro (HUMALOG) 100 UNIT/ML injection Inject into the skin. Sliding scale    . loratadine (CLARITIN) 10 MG tablet Take 10 mg by mouth daily as needed. For allergies    . losartan (COZAAR) 50 MG tablet Take 1 tablet (50 mg total) by mouth 2 (two) times daily.    . pantoprazole (PROTONIX) 40 MG tablet Take 1 tablet by mouth 2 (two) times daily.     . pramipexole (MIRAPEX) 0.5 MG tablet Take 0.5 mg by mouth daily.    . promethazine (PHENERGAN) 25 MG tablet Take 1 tablet by mouth 3 (three) times daily as needed.    . ondansetron (ZOFRAN) 8 MG tablet Take 16 mg by mouth 2 (two) times daily. Reported on 05/07/2015     No current facility-administered medications for this visit.    Past Medical History  Diagnosis Date  . Fibromyalgia   . Cervical spine disease   . Seasonal allergies   . DM (diabetes mellitus) ([place])   . HTN (hypertension)   . Hypercholesteremia   . GERD (  gastroesophageal reflux disease)   . PONV (postoperative nausea and vomiting)   . Seizures ([place])     last one > 20 years ago  . Dysphagia     Past Surgical History  Procedure Laterality Date  . Cervical spine surgery  APR 2012  . Carpal tunnel release  RIGHT  . Shoulder surgery  RIGHT  . Colonoscopy  2008 [place] [facility]  . Tubal ligation    . Flexible sigmoidoscopy  01/24/2011    SLF: Internal Hemorrhoids  . Esophagogastroduodenoscopy  01/24/2011    [id number] 2o TO CERVICAL PLATES IN PT'S NECK AND MILD ESO MOTILITY DISORDER/Mild gastritis  . Savory dilation  01/24/2011    Procedure: SAVORY DILATION;  Surgeon: Dorothyann Peng, MD;  Location[facility];  Service:  Endoscopy;  Laterality: N/A;  . Esophagogastroduodenoscopy  03/03/11    [id number] gastritis     Social History   Social History  . Marital Status: Widowed    Spouse Name: N/A  . Number of Children: N/A  . Years of Education: N/A   Occupational History  . Not on file.   Social History Main Topics  . Smoking status: Former Smoker -- 0.50 packs/day for 2 years    Types: Cigarettes    Start date: 10/12/1966    Quit date: 10/11/1968  . Smokeless tobacco: Never Used  . Alcohol Use: 0.0 oz/week    0 Standard drinks or equivalent per week     Comment: occasionally  . Drug Use: No  . Sexual Activity: Not on file   Other Topics Concern  . Not on file   Social History Narrative   MARRIED, 2 KIDS, YOUNGEST 52 YO.   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 Neck: No JVD, no thyromegaly. Lungs: Clear to auscultation bilaterally with normal respiratory effort. CV: Nondisplaced PMI. Regular rate and rhythm, normal S1/S2, no S3/S4, no murmur. No pretibial or periankle edema.  Abdomen: Soft, nontender, central obesity.  Neurologic: Alert and oriented x 3.  Psych: Normal affect. Skin: Normal. Musculoskeletal: No gross deformities. Extremities: No clubbing or cyanosis.   ECG: Most recent ECG reviewed.      ASSESSMENT AND PLAN: 1. Syncope: No recurrences since discontinuation of diuretic. Prior episodes possibly due to autonomic neuropathy given diabetes and hypotension, which appeared to precipitate episodes. If frequent recurrences, would consider event monitoring.   2. Essential HTN: Controlled. No changes. Fluctuating BP likely also due to autonomic neuropathy. Encouraged exercising which will help to regulate vascular tone.  3. Hyperlipidemia: Remains  elevated particularly in context of diabetes. Statin intolerant. Will continue Zetia 10 mg and repeat lipids in an effort to modify cardiovascular risk factors.  Dispo: f/u 6 months.  Kate Sable, M.D., F.A.C.C.

## 2015-05-07 NOTE — Patient Instructions (Signed)
Your physician wants you to follow-up in: 6 months with Dr. Koneswaran. You will receive a reminder letter in the mail two months in advance. If you don't receive a letter, please call our office to schedule the follow-up appointment.  Your physician recommends that you continue on your current medications as directed. Please refer to the Current Medication list given to you today.  Thank you for choosing [facility]!   

## 2015-05-07 NOTE — Addendum Note (Signed)
Addended by: Julian Hy T on: 05/07/2015 11:17 AM   Modules accepted: Orders

## 2015-06-01 ENCOUNTER — Encounter: Payer: Self-pay | Admitting: Adult Health

## 2015-06-01 ENCOUNTER — Ambulatory Visit (INDEPENDENT_AMBULATORY_CARE_PROVIDER_SITE_OTHER): Payer: 59

## 2015-06-01 ENCOUNTER — Ambulatory Visit (INDEPENDENT_AMBULATORY_CARE_PROVIDER_SITE_OTHER): Payer: 59 | Admitting: Adult Health

## 2015-06-01 VITALS — BP 126/66 | HR 103 | Ht 62.0 in | Wt 197.0 lb

## 2015-06-01 DIAGNOSIS — R001 Bradycardia, unspecified: Secondary | ICD-10-CM

## 2015-06-01 DIAGNOSIS — I10 Essential (primary) hypertension: Secondary | ICD-10-CM | POA: Diagnosis not present

## 2015-06-01 MED ORDER — METOPROLOL SUCCINATE ER 25 MG PO TB24: 12.5000 mg | ORAL_TABLET | Freq: Every day | ORAL | Status: DC

## 2015-06-01 MED ORDER — LOSARTAN POTASSIUM 25 MG PO TABS: 25.0000 mg | ORAL_TABLET | Freq: Every day | ORAL | Status: DC

## 2015-06-01 NOTE — Progress Notes (Signed)
Cardiology Office Note   Date:  06/01/2015   ID:  Rolan Bucco, DOB [birth year], MRN [id number]  PCP:  Rory Percy, MD  Cardiologist: Woodroe Chen, NP   Chief Complaint  Patient presents with  . Hypertension  . Loss of Consciousness      History of Present Illness: Kelly Wiley is a 60 y.o. female who presents for ongoing assessment and management of syncope, hypertension, hyperlipidemia.  The patient refused, statin therapy.  She is normally followed in the [place] office.  She was last seen by Dr. Marcello Moores warned in November of 2016.  At that time.  She was stable from a cardiac standpoint with normal.  Blood pressures, no further episodes of syncope, and continued Zetia at 10 mg daily.  She is here for follow-up.  She states that approximately one week ago.  Her heart rate went into the 40s with associated weakness and near syncope.  She stopped taking amlodipine.  3 days later, her blood pressure became very elevated.  She denied any chest pain or syncope associated.  She has become concerned about her blood pressure being so labile and her low heart rate.    Past Medical History  Diagnosis Date  . Fibromyalgia   . Cervical spine disease   . Seasonal allergies   . DM (diabetes mellitus) ([place])   . HTN (hypertension)   . Hypercholesteremia   . GERD (gastroesophageal reflux disease)   . PONV (postoperative nausea and vomiting)   . Seizures ([place])     last one > 20 years ago  . Dysphagia     Past Surgical History  Procedure Laterality Date  . Cervical spine surgery  APR 2012  . Carpal tunnel release  RIGHT  . Shoulder surgery  RIGHT  . Colonoscopy  2008 [place] [facility]  . Tubal ligation    . Flexible sigmoidoscopy  01/24/2011    SLF: Internal Hemorrhoids  . Esophagogastroduodenoscopy  01/24/2011    [id number] 2o TO CERVICAL PLATES IN PT'S NECK AND MILD ESO MOTILITY DISORDER/Mild gastritis  . Savory dilation  01/24/2011    Procedure: SAVORY  DILATION;  Surgeon: Dorothyann Peng, MD;  [facility];  Service: Endoscopy;  Laterality: N/A;  . Esophagogastroduodenoscopy  03/03/11    [id number] gastritis      Current Outpatient Prescriptions  Medication Sig Dispense Refill  . aspirin-acetaminophen-caffeine (EXCEDRIN MIGRAINE) 250-250-65 MG per tablet Take 1 tablet by mouth every 6 (six) hours as needed. headache     . B Complex Vitamins (B-COMPLEX/B-12 PO) Take 1 tablet by mouth daily.     . baclofen (LIORESAL) 10 MG tablet Take 1 tablet by mouth 3 (three) times daily.    . DULoxetine (CYMBALTA) 30 MG capsule Take 1 capsule by mouth 2 (two) times daily.    Marland Kitchen ezetimibe (ZETIA) 10 MG tablet Take 1 tablet (10 mg total) by mouth daily. 30 tablet 11  . HYDROcodone-acetaminophen (NORCO) 10-325 MG tablet Take 1 tablet by mouth every 6 (six) hours as needed.    . hyoscyamine (LEVSIN, ANASPAZ) 0.125 MG tablet Take 0.125 mg by mouth every 4 (four) hours as needed. Gi pain    . insulin glargine (LANTUS) 100 UNIT/ML injection Inject into the skin. Sliding scale    . insulin lispro (HUMALOG) 100 UNIT/ML injection Inject into the skin. Sliding scale    . loratadine (CLARITIN) 10 MG tablet Take 10 mg by mouth daily as needed. For allergies    .  ondansetron (ZOFRAN) 8 MG tablet Take 16 mg by mouth 2 (two) times daily. Reported on 05/07/2015    . pantoprazole (PROTONIX) 40 MG tablet Take 1 tablet by mouth 2 (two) times daily.     . pramipexole (MIRAPEX) 0.5 MG tablet Take 0.5 mg by mouth daily.    . promethazine (PHENERGAN) 25 MG tablet Take 1 tablet by mouth 3 (three) times daily as needed.    Marland Kitchen losartan (COZAAR) 25 MG tablet Take 1 tablet (25 mg total) by mouth at bedtime. 90 tablet 3  . metoprolol succinate (TOPROL-XL) 25 MG 24 hr tablet Take 0.5 tablets (12.5 mg total) by mouth daily. Take with or immediately following a meal. 30 tablet 0   No current facility-administered medications for this visit.    Allergies:   Ethosuximide; Iodine  131; Phenytoin sodium extended; and Sulfa antibiotics    Social History:  The patient  reports that she quit smoking about 46 years ago. Her smoking use included Cigarettes. She started smoking about 48 years ago. She has a 1 pack-year smoking history. She has never used smokeless tobacco. She reports that she drinks alcohol. She reports that she does not use illicit drugs.   Family History:  The patient's family history is negative for Colon cancer and Colon polyps.    ROS: All other systems are reviewed and negative. Unless otherwise mentioned in H&P    PHYSICAL EXAM: VS:  BP 126/66 mmHg  Pulse 103  Ht 5\' 2"  (1.575 m)  Wt 197 lb (89.359 kg)  BMI 36.02 kg/m2  SpO2 97% , BMI Body mass index is 36.02 kg/(m^2). GEN: Well nourished, well developed, in no acute distress HEENT: normal Neck: no JVD, carotid bruits, or masses Cardiac: RRR; tachycardic,no murmurs, rubs, or gallops,no edema  Respiratory:  clear to auscultation bilaterally, normal work of breathing GI: soft, nontender, nondistended, + BS MS: no deformity or atrophy Skin: warm and dry, no rash Neuro:  Strength and sensation are intact Psych: euthymic mood, full affect    Wt Readings from Last 3 Encounters:  06/01/15 197 lb (89.359 kg)  05/07/15 191 lb (86.637 kg)  01/27/15 186 lb (84.369 kg)    ASSESSMENT AND PLAN:  1.Hypertension:she states her blood pressure is labile going very high and then sometimes very low without provocation.  I have talked with her about sleep apnea and her family member, who is with her states that she does stop breathing and snores loudly during the nighttime.  She has not had a sleep study done. Medications will be adjusted.  2.Tachycardia:I will discontinue amlodipine, begin her on metoprolol succinate 12.5 mg in the morning, I will decrease her Cozaar 50 mg twice a day to 25 mg at at bedtime.  Today, her blood pressure is normal, but heart rate is elevated.  I will place a cardiac monitor on  for 2 weeks to evaluate her heart rate response to the low-dose beta blocker, she will be seen in one month in the [place] office for blood pressure check and evaluation of symptoms and response to treatment.  If her heart rate gets very low, or she is noticing a hypertensive response to medication adjustments, she is to call us for further instruction or to be seen sooner   Current medicines are reviewed at length with the patient today.    Labs/ tests ordered today include:   Orders Placed This Encounter  Procedures  . Cardiac event monitor     Disposition:   FU with Dr.  Koneswaran in one month.  [place] office. Signed, Jory Sims, NP  06/01/2015 4:48 PM    [facility] [address] [place], [place] [address] Phone: [phone]; Fax: [phone]

## 2015-06-01 NOTE — Patient Instructions (Signed)
Your physician recommends that you schedule a follow-up appointment in: 1 Month with Dr. Bronson Ing  Your physician has recommended you make the following change in your medication:   STOP Taking Amlodipine  Decrease Losartan to 25 mg Daily at Bedtime  Start Metoprolol Succinate 12.5 mg in the Morning  Your physician has recommended that you wear an event monitor. Event monitors are medical devices that record the heart's electrical activity. Doctors most often [place] these monitors to diagnose arrhythmias. Arrhythmias are problems with the speed or rhythm of the heartbeat. The monitor is a small, portable device. You can wear one while you do your normal daily activities. This is usually used to diagnose what is causing palpitations/syncope (passing out).  If you need a refill on your cardiac medications before your next appointment, please call your pharmacy.  Thank you for choosing [facility]!

## 2015-06-01 NOTE — Progress Notes (Deleted)
Name: Kelly Wiley    DOB: Jul 07, 1955  Age: 60 y.o.  MR#: [id number]       PCP:  Rory Percy, MD      Insurance: Payor: [employer] / Plan: Electrical engineer / Product Type: *No Product type* /   CC:    Chief Complaint  Patient presents with  . Hypertension  . Loss of Consciousness    VS Filed Vitals:   06/01/15 1603  BP: 126/66  Pulse: 103  Height: 5\' 2"  (1.575 m)  Weight: 197 lb (89.359 kg)  SpO2: 97%    Weights Current Weight  06/01/15 197 lb (89.359 kg)  05/07/15 191 lb (86.637 kg)  01/27/15 186 lb (84.369 kg)    Blood Pressure  BP Readings from Last 3 Encounters:  06/01/15 126/66  05/07/15 117/79  01/27/15 125/80     Admit date:  (Not on file) Last encounter with RMR:  Visit date not found   Allergy Ethosuximide; Iodine 131; Phenytoin sodium extended; and Sulfa antibiotics  Current Outpatient Prescriptions  Medication Sig Dispense Refill  . amLODipine (NORVASC) 5 MG tablet Take 1 tablet (5 mg total) by mouth daily.    Marland Kitchen aspirin-acetaminophen-caffeine (EXCEDRIN MIGRAINE) 250-250-65 MG per tablet Take 1 tablet by mouth every 6 (six) hours as needed. headache     . B Complex Vitamins (B-COMPLEX/B-12 PO) Take 1 tablet by mouth daily.     . baclofen (LIORESAL) 10 MG tablet Take 1 tablet by mouth 3 (three) times daily.    . DULoxetine (CYMBALTA) 30 MG capsule Take 1 capsule by mouth 2 (two) times daily.    Marland Kitchen ezetimibe (ZETIA) 10 MG tablet Take 1 tablet (10 mg total) by mouth daily. 30 tablet 11  . HYDROcodone-acetaminophen (NORCO) 10-325 MG tablet Take 1 tablet by mouth every 6 (six) hours as needed.    . hyoscyamine (LEVSIN, ANASPAZ) 0.125 MG tablet Take 0.125 mg by mouth every 4 (four) hours as needed. Gi pain    . insulin glargine (LANTUS) 100 UNIT/ML injection Inject into the skin. Sliding scale    . insulin lispro (HUMALOG) 100 UNIT/ML injection Inject into the skin. Sliding scale    . loratadine (CLARITIN) 10 MG tablet Take 10 mg by mouth daily as needed. For allergies     . losartan (COZAAR) 50 MG tablet Take 1 tablet (50 mg total) by mouth 2 (two) times daily.    . ondansetron (ZOFRAN) 8 MG tablet Take 16 mg by mouth 2 (two) times daily. Reported on 05/07/2015    . pantoprazole (PROTONIX) 40 MG tablet Take 1 tablet by mouth 2 (two) times daily.     . pramipexole (MIRAPEX) 0.5 MG tablet Take 0.5 mg by mouth daily.    . promethazine (PHENERGAN) 25 MG tablet Take 1 tablet by mouth 3 (three) times daily as needed.     No current facility-administered medications for this visit.    Discontinued Meds:   There are no discontinued medications.  Patient Active Problem List   Diagnosis Date Noted  . Syncope 10/12/2014  . Nausea 05/24/2011  . Dysphagia 12/28/2010  . GERD (gastroesophageal reflux disease) 12/28/2010  . Weight loss, abnormal 12/28/2010  . Diarrhea 07/21/2010    LABS No results found for: NA, K, CL, CO2, GLUCOSE, BUN, CREATININE, CALCIUM, GFRNONAA, GFRAA CMP     Component Value Date/Time   PROT 7.3 07/21/2010 1103   ALBUMIN 4.4 07/21/2010 1103   AST 23 07/21/2010 1103   ALT 25 07/21/2010 1103   ALKPHOS 66  07/21/2010 1103   BILITOT 0.4 07/21/2010 1103       Component Value Date/Time   WBC 7.0 12/28/2010 1637   HGB 14.5 12/28/2010 1637   HCT 43.0 12/28/2010 1637   MCV 88.5 12/28/2010 1637    Lipid Panel  No results found for: CHOL, TRIG, HDL, CHOLHDL, VLDL, LDLCALC, LDLDIRECT  ABG No results found for: PHART, PCO2ART, PO2ART, HCO3, TCO2, ACIDBASEDEF, O2SAT   Lab Results  Component Value Date   TSH 3.994 12/28/2010   BNP (last 3 results) No results for input(s): BNP in the last 8760 hours.  ProBNP (last 3 results) No results for input(s): PROBNP in the last 8760 hours.  Cardiac Panel (last 3 results) No results for input(s): CKTOTAL, CKMB, TROPONINI, RELINDX in the last 72 hours.  Iron/TIBC/Ferritin/ %Sat No results found for: IRON, TIBC, FERRITIN, IRONPCTSAT   EKG Orders placed or performed in visit on 05/06/15  .  EKG     Prior Assessment and Plan Problem List as of 06/01/2015      Cardiovascular and Mediastinum   Syncope     Digestive   Dysphagia   GERD (gastroesophageal reflux disease)   Last Assessment & Plan 12/28/2010 Office Visit Edited 12/29/2010  9:31 PM by Mahala Menghini, PA    Refractory GERD, epigastric burning, dysphagia. EGD/ED.  I have discussed the risks, alternatives, benefits with regards to but not limited to the risk of reaction to medication, bleeding, infection, perforation and the patient is agreeable to proceed. Written consent to be obtained.  Stop omeprazole and start [place].        Other   Diarrhea   Last Assessment & Plan 05/24/2011 Office Visit Written 05/24/2011 11:09 AM by Danie Binder, MD    MOST LIKELY DUE TO IBS-D, MEDS, AND/OR DIABETIC ENTEROPATHY.  MAY USE LOMOTIL OR LEVSIN PRN FOR DIARRHEA. OPV IN 4 MOS. CONTINUE DIET MODIFICATION/WEIGHT LOSS EFFORTS.      Weight loss, abnormal   Nausea   Last Assessment & Plan 05/24/2011 Office Visit Written 05/24/2011 11:12 AM by Danie Binder, MD    LIKELY DUE TO DM GASTROPRESIS, DIFFERENTIAL INCLUDES BREAKTHROUGH REFLUX, MEDS, PSYCHOSOMATIC DISORDER.  DISCUSSED MANAGEMENT OPTIONS WITH PT/HUSBAND: 1. GES--> ?REGLAN OR 2. TRY GASTROPARESIS DIET & IF FAILS TO IMPROVE Sx, GES-->?REGLAN. PT ELECTED TO TRY DIET FIRST. PT CONCERNED ABOUT COST OF MEDICAL BILLS. PHENERGAN PRN. CONTINUE DIET MODIFICATION: LOW FAT/DIABETIC/GASTROPARESIS DIET. OP IN 4 MOS.          Imaging: No results found.

## 2015-06-04 ENCOUNTER — Telehealth: Payer: Self-pay | Admitting: *Deleted

## 2015-06-04 NOTE — Telephone Encounter (Signed)
Patient called with concerns about her BP being elevated since she had recent BP medication changes. Patient said her BP was 164/101 now. No c/o chest pain, dizziness or sob. Patient also c/o being allergic to the electrodes she is wearing with her heart monitor. Patient has being wearing monitor for about 2 days now and is supposed to wear the monitor for 2 weeks. Patient advised that because of her recent BP changes, that it will take more time before the new BP medications will take an effect. Patient advised to continue to monitor her BP throughout the weekend and if she develops a bad headache, dizziness, N/V or sob, or if her BP continues to go higher, that she needed to go to the ED for an evaluation. Patient also given the number to contact [place] for to get some hypoallergenic electrodes to help with reaction she was having. Patient verbalized understanding of plan.

## 2015-06-07 NOTE — Telephone Encounter (Signed)
Can we follow up with this patient and see how she did over the weekend   Kelly Abts MD

## 2015-06-09 NOTE — Telephone Encounter (Signed)
Spoke with patient this morning and she said that she did request hypoallergenic patches from [employer] and her BP is better and ranging around 153/91 & HR 88. Patient advised that she can use hydrocortisone cream and benadryl for the reaction in the meantime. Patient verbalized understanding of plan.

## 2015-06-14 ENCOUNTER — Telehealth: Payer: Self-pay | Admitting: *Deleted

## 2015-06-14 NOTE — Telephone Encounter (Signed)
Message left on voice mail by patient - was wearing heart monitor, but allergic to patches.  Has not gotten them yet.  Attempted to return call - left message.

## 2015-06-15 NOTE — Telephone Encounter (Signed)
Returned call to patient.  Stated that she had not received any new patches & that she thought the nurse here was doing that.  Informed her that nurses note stated that the patient had already called to request this.  Patient stated that she only wore the monitor for a few days.  Do you want to extend the monitor & see if she can obtain some hypoallergenic patches?  Also, patient c/o elevated blood pressure.  Stated that she is having a lot of back pain & is due to get MRI on Monday.  Stated she is taking Hydrocodone & Baclofen for this .  Stating that her BP has been running 160/111, 170/113, 174/108.  Heart rate has been below 100 per patient.  Message sent to provider for further advice.

## 2015-06-16 NOTE — Telephone Encounter (Signed)
[employer] [facility] contacted to send hypoallergenic electrodes and to extend the monitor for 1-2 weeks per Bronson Ing. Per Yvetta Coder in the patient support department, they will extend monitor for 2 weeks and over night sensitive electrodes via UPS to patient. Patient informed via message machine.

## 2015-06-30 ENCOUNTER — Telehealth: Payer: Self-pay | Admitting: *Deleted

## 2015-06-30 ENCOUNTER — Encounter: Payer: Self-pay | Admitting: *Deleted

## 2015-06-30 NOTE — Telephone Encounter (Signed)
Pt says she went to [facility] ED for low BP last week (will request records) says yesterday when her son checked BP again it was low (she didn't have reading of BP or HR) pt denied CP/dizziness/SOB, says she didn't realize she had appt already scheduled for 07/05/15 - pt will call back if needed prior to appt.

## 2015-07-05 ENCOUNTER — Ambulatory Visit (INDEPENDENT_AMBULATORY_CARE_PROVIDER_SITE_OTHER): Payer: 59 | Admitting: Cardiovascular Disease

## 2015-07-05 ENCOUNTER — Encounter: Payer: Self-pay | Admitting: Cardiovascular Disease

## 2015-07-05 VITALS — BP 172/100 | HR 78 | Ht 62.0 in | Wt 186.0 lb

## 2015-07-05 DIAGNOSIS — R55 Syncope and collapse: Secondary | ICD-10-CM

## 2015-07-05 DIAGNOSIS — I10 Essential (primary) hypertension: Secondary | ICD-10-CM

## 2015-07-05 DIAGNOSIS — E785 Hyperlipidemia, unspecified: Secondary | ICD-10-CM

## 2015-07-05 NOTE — Patient Instructions (Signed)
Continue all current medications. Your physician wants you to follow up in: 6 months.  You will receive a reminder letter in the mail one-two months in advance.  If you don't receive a letter, please call our office to schedule the follow up appointment   

## 2015-07-05 NOTE — Progress Notes (Signed)
Patient ID: Kelly Wiley, female   DOB: 11/25/1955, 60 y.o.   MRN: [id number]      SUBJECTIVE: The patient returns for routine follow-up. She has a history of syncope which appeared to be diuretic induced. She also has prior episodes deemed secondary to autonomic neuropathy as she has diabetes. She saw K. Lawrence NP on 3/14 and amlodipine was discontinued and metoprolol succinate was initiated. Cozaar was decreased. She reportedly went to the [facility] ED earlier this month for low blood pressure. She underwent cardiac monitoring which demonstrated sinus rhythm with one episode of asymptomatic sinus tachycardia, heart rate 107 bpm.  While her heart rate has been better controlled with metoprolol, her blood pressure has been elevated but she tells me she has 2 herniated disks and is going to see orthopedics in 3 days. She denies syncope. She drinks primarily flavored water.  Review of Systems: As per "subjective", otherwise negative.  Allergies  Allergen Reactions  . Ativan [Lorazepam]     Lowered blood pressure  . Ethosuximide Other (See Comments)    Does not know   . Iodine 131 Other (See Comments)    Blacked out   . Phenytoin Sodium Extended Other (See Comments)    Killed white blood cell at age of 89   . Sulfa Antibiotics Other (See Comments)    Bones     Current Outpatient Prescriptions  Medication Sig Dispense Refill  . aspirin-acetaminophen-caffeine (EXCEDRIN MIGRAINE) 250-250-65 MG per tablet Take 1 tablet by mouth every 6 (six) hours as needed. headache     . B Complex Vitamins (B-COMPLEX/B-12 PO) Take 1 tablet by mouth daily.     . baclofen (LIORESAL) 10 MG tablet Take 1 tablet by mouth 3 (three) times daily.    . DULoxetine (CYMBALTA) 30 MG capsule Take 1 capsule by mouth 2 (two) times daily.    Marland Kitchen ezetimibe (ZETIA) 10 MG tablet Take 1 tablet (10 mg total) by mouth daily. 30 tablet 11  . HYDROcodone-acetaminophen (NORCO) 10-325 MG tablet Take 1 tablet by  mouth every 6 (six) hours as needed.    . hyoscyamine (LEVSIN, ANASPAZ) 0.125 MG tablet Take 0.125 mg by mouth every 4 (four) hours as needed. Gi pain    . insulin glargine (LANTUS) 100 UNIT/ML injection Inject into the skin. Sliding scale    . insulin lispro (HUMALOG) 100 UNIT/ML injection Inject into the skin. Sliding scale    . loratadine (CLARITIN) 10 MG tablet Take 10 mg by mouth daily as needed. For allergies    . losartan (COZAAR) 25 MG tablet Take 1 tablet (25 mg total) by mouth at bedtime. 90 tablet 3  . metoprolol succinate (TOPROL-XL) 25 MG 24 hr tablet Take 0.5 tablets (12.5 mg total) by mouth daily. Take with or immediately following a meal. 30 tablet 0  . ondansetron (ZOFRAN) 8 MG tablet Take 16 mg by mouth 2 (two) times daily. Reported on 05/07/2015    . pantoprazole (PROTONIX) 40 MG tablet Take 1 tablet by mouth 2 (two) times daily.     . pramipexole (MIRAPEX) 0.5 MG tablet Take 0.5 mg by mouth daily.    . promethazine (PHENERGAN) 25 MG tablet Take 1 tablet by mouth 3 (three) times daily as needed.     No current facility-administered medications for this visit.    Past Medical History  Diagnosis Date  . Fibromyalgia   . Cervical spine disease   . Seasonal allergies   . DM (diabetes mellitus) ([place])   .  HTN (hypertension)   . Hypercholesteremia   . GERD (gastroesophageal reflux disease)   . PONV (postoperative nausea and vomiting)   . Seizures ([place])     last one > 20 years ago  . Dysphagia     Past Surgical History  Procedure Laterality Date  . Cervical spine surgery  APR 2012  . Carpal tunnel release  RIGHT  . Shoulder surgery  RIGHT  . Colonoscopy  2008 [place]  . Tubal ligation    . Flexible sigmoidoscopy  01/24/2011    SLF: Internal Hemorrhoids  . Esophagogastroduodenoscopy  01/24/2011    [id number] 2o TO CERVICAL PLATES IN PT'S NECK AND MILD ESO MOTILITY DISORDER/Mild gastritis  . Savory dilation  01/24/2011    Procedure: SAVORY DILATION;   Surgeon: Dorothyann Peng, MD;  Location: [facility];  Service: Endoscopy;  Laterality: N/A;  . Esophagogastroduodenoscopy  03/03/11    [id number] gastritis     Social History   Social History  . Marital Status: Widowed    Spouse Name: N/A  . Number of Children: N/A  . Years of Education: N/A   Occupational History  . Not on file.   Social History Main Topics  . Smoking status: Former Smoker -- 0.50 packs/day for 2 years    Types: Cigarettes    Start date: 10/12/1966    Quit date: 10/11/1968  . Smokeless tobacco: Never Used  . Alcohol Use: 0.0 oz/week    0 Standard drinks or equivalent per week     Comment: occasionally  . Drug Use: No  . Sexual Activity: Not on file   Other Topics Concern  . Not on file   Social History Narrative   MARRIED, 2 KIDS, YOUNGEST 68 YO.   Son has DM, malabsorption   Works in Press photographer in [place]   EtOH: rare   No tobacco products     Filed Vitals:   07/05/15 1446  BP: 172/100  Pulse: 78  Height: 5\' 2"  (1.575 m)  Weight: 186 lb (84.369 kg)  SpO2: 96%    PHYSICAL EXAM General: NAD HEENT: Normal. Neck: No JVD, no thyromegaly. Lungs: Clear to auscultation bilaterally with normal respiratory effort. CV: Nondisplaced PMI. Regular rate and rhythm, normal S1/S2, no S3/S4, no murmur. No pretibial or periankle edema.  Abdomen: Soft, nontender, central obesity.  Neurologic: Alert and oriented x 3.  Psych: Normal affect. Skin: Normal. Musculoskeletal: No gross deformities. Extremities: No clubbing or cyanosis.   ECG: Most recent ECG reviewed.      ASSESSMENT AND PLAN: 1. Syncope: No recurrences since discontinuation of diuretic. Prior episodes possibly due to autonomic neuropathy given diabetes and hypotension, which appeared to precipitate episodes.  2. Essential HTN: Elevated. Fluctuating BP likely due to autonomic neuropathy and now elevated due to lumbar disc hernation. Previously encouraged exercising which will help  to regulate vascular tone. I have asked her to keep legs elevated to raise BP when she notices it is dropping, and to push fluid intake.  3. Hyperlipidemia: Statin intolerant. Will continue Zetia 10 mg in an effort to modify cardiovascular risk factors.  Dispo: fu 6 months.   Kate Sable, M.D., F.A.C.C.

## 2015-10-19 DIAGNOSIS — M5106 Intervertebral disc disorders with myelopathy, lumbar region: Secondary | ICD-10-CM | POA: Diagnosis not present

## 2015-10-19 DIAGNOSIS — T84428A Displacement of other internal orthopedic devices, implants and grafts, initial encounter: Secondary | ICD-10-CM | POA: Diagnosis not present

## 2015-10-19 DIAGNOSIS — M4806 Spinal stenosis, lumbar region: Secondary | ICD-10-CM | POA: Diagnosis not present

## 2015-10-26 DIAGNOSIS — E7800 Pure hypercholesterolemia, unspecified: Secondary | ICD-10-CM | POA: Diagnosis present

## 2015-10-26 DIAGNOSIS — T84428A Displacement of other internal orthopedic devices, implants and grafts, initial encounter: Secondary | ICD-10-CM | POA: Diagnosis present

## 2015-10-26 DIAGNOSIS — M5106 Intervertebral disc disorders with myelopathy, lumbar region: Secondary | ICD-10-CM | POA: Diagnosis not present

## 2015-10-26 DIAGNOSIS — Z981 Arthrodesis status: Secondary | ICD-10-CM | POA: Diagnosis not present

## 2015-10-26 DIAGNOSIS — T84226A Displacement of internal fixation device of vertebrae, initial encounter: Secondary | ICD-10-CM | POA: Diagnosis not present

## 2015-10-26 DIAGNOSIS — E119 Type 2 diabetes mellitus without complications: Secondary | ICD-10-CM | POA: Diagnosis present

## 2015-10-26 DIAGNOSIS — Z79899 Other long term (current) drug therapy: Secondary | ICD-10-CM | POA: Diagnosis not present

## 2015-10-26 DIAGNOSIS — Z882 Allergy status to sulfonamides status: Secondary | ICD-10-CM | POA: Diagnosis not present

## 2015-10-26 DIAGNOSIS — M797 Fibromyalgia: Secondary | ICD-10-CM | POA: Diagnosis present

## 2015-10-26 DIAGNOSIS — I10 Essential (primary) hypertension: Secondary | ICD-10-CM | POA: Diagnosis present

## 2015-10-26 DIAGNOSIS — Z888 Allergy status to other drugs, medicaments and biological substances status: Secondary | ICD-10-CM | POA: Diagnosis not present

## 2015-10-26 DIAGNOSIS — Z794 Long term (current) use of insulin: Secondary | ICD-10-CM | POA: Diagnosis not present

## 2015-10-26 DIAGNOSIS — K589 Irritable bowel syndrome without diarrhea: Secondary | ICD-10-CM | POA: Diagnosis not present

## 2015-10-26 DIAGNOSIS — E785 Hyperlipidemia, unspecified: Secondary | ICD-10-CM | POA: Diagnosis not present

## 2015-10-26 DIAGNOSIS — Z79891 Long term (current) use of opiate analgesic: Secondary | ICD-10-CM | POA: Diagnosis not present

## 2015-11-04 DIAGNOSIS — M47816 Spondylosis without myelopathy or radiculopathy, lumbar region: Secondary | ICD-10-CM | POA: Diagnosis not present

## 2015-11-04 DIAGNOSIS — Z981 Arthrodesis status: Secondary | ICD-10-CM | POA: Diagnosis not present

## 2015-11-09 DIAGNOSIS — Z6832 Body mass index (BMI) 32.0-32.9, adult: Secondary | ICD-10-CM | POA: Diagnosis not present

## 2015-11-09 DIAGNOSIS — K219 Gastro-esophageal reflux disease without esophagitis: Secondary | ICD-10-CM | POA: Diagnosis not present

## 2015-11-09 DIAGNOSIS — M79672 Pain in left foot: Secondary | ICD-10-CM | POA: Diagnosis not present

## 2015-11-09 DIAGNOSIS — I10 Essential (primary) hypertension: Secondary | ICD-10-CM | POA: Diagnosis not present

## 2015-11-09 DIAGNOSIS — M5116 Intervertebral disc disorders with radiculopathy, lumbar region: Secondary | ICD-10-CM | POA: Diagnosis not present

## 2015-11-24 DIAGNOSIS — Z6833 Body mass index (BMI) 33.0-33.9, adult: Secondary | ICD-10-CM | POA: Diagnosis not present

## 2015-11-24 DIAGNOSIS — N3001 Acute cystitis with hematuria: Secondary | ICD-10-CM | POA: Diagnosis not present

## 2015-11-24 DIAGNOSIS — K210 Gastro-esophageal reflux disease with esophagitis: Secondary | ICD-10-CM | POA: Diagnosis not present

## 2015-12-07 DIAGNOSIS — M47816 Spondylosis without myelopathy or radiculopathy, lumbar region: Secondary | ICD-10-CM | POA: Diagnosis not present

## 2015-12-07 DIAGNOSIS — M5106 Intervertebral disc disorders with myelopathy, lumbar region: Secondary | ICD-10-CM | POA: Diagnosis not present

## 2015-12-13 DIAGNOSIS — Z23 Encounter for immunization: Secondary | ICD-10-CM | POA: Diagnosis not present

## 2015-12-13 DIAGNOSIS — N3080 Other cystitis without hematuria: Secondary | ICD-10-CM | POA: Diagnosis not present

## 2015-12-13 DIAGNOSIS — Z6833 Body mass index (BMI) 33.0-33.9, adult: Secondary | ICD-10-CM | POA: Diagnosis not present

## 2015-12-16 DIAGNOSIS — S32010A Wedge compression fracture of first lumbar vertebra, initial encounter for closed fracture: Secondary | ICD-10-CM | POA: Diagnosis not present

## 2015-12-16 DIAGNOSIS — M25551 Pain in right hip: Secondary | ICD-10-CM | POA: Diagnosis not present

## 2015-12-16 DIAGNOSIS — Z981 Arthrodesis status: Secondary | ICD-10-CM | POA: Diagnosis not present

## 2015-12-16 DIAGNOSIS — S3992XA Unspecified injury of lower back, initial encounter: Secondary | ICD-10-CM | POA: Diagnosis not present

## 2015-12-16 DIAGNOSIS — M545 Low back pain: Secondary | ICD-10-CM | POA: Diagnosis not present

## 2015-12-20 DIAGNOSIS — E119 Type 2 diabetes mellitus without complications: Secondary | ICD-10-CM | POA: Diagnosis not present

## 2015-12-20 DIAGNOSIS — Z79891 Long term (current) use of opiate analgesic: Secondary | ICD-10-CM | POA: Diagnosis not present

## 2015-12-20 DIAGNOSIS — K219 Gastro-esophageal reflux disease without esophagitis: Secondary | ICD-10-CM | POA: Diagnosis not present

## 2015-12-20 DIAGNOSIS — S32010A Wedge compression fracture of first lumbar vertebra, initial encounter for closed fracture: Secondary | ICD-10-CM | POA: Diagnosis not present

## 2015-12-20 DIAGNOSIS — K589 Irritable bowel syndrome without diarrhea: Secondary | ICD-10-CM | POA: Diagnosis not present

## 2015-12-20 DIAGNOSIS — Z79899 Other long term (current) drug therapy: Secondary | ICD-10-CM | POA: Diagnosis not present

## 2015-12-20 DIAGNOSIS — G40909 Epilepsy, unspecified, not intractable, without status epilepticus: Secondary | ICD-10-CM | POA: Diagnosis not present

## 2015-12-20 DIAGNOSIS — M797 Fibromyalgia: Secondary | ICD-10-CM | POA: Diagnosis not present

## 2015-12-20 DIAGNOSIS — I10 Essential (primary) hypertension: Secondary | ICD-10-CM | POA: Diagnosis not present

## 2015-12-20 DIAGNOSIS — Z981 Arthrodesis status: Secondary | ICD-10-CM | POA: Diagnosis not present

## 2015-12-20 DIAGNOSIS — E7800 Pure hypercholesterolemia, unspecified: Secondary | ICD-10-CM | POA: Diagnosis not present

## 2015-12-20 DIAGNOSIS — Z9851 Tubal ligation status: Secondary | ICD-10-CM | POA: Diagnosis not present

## 2015-12-20 DIAGNOSIS — Z791 Long term (current) use of non-steroidal anti-inflammatories (NSAID): Secondary | ICD-10-CM | POA: Diagnosis not present

## 2015-12-20 DIAGNOSIS — Z882 Allergy status to sulfonamides status: Secondary | ICD-10-CM | POA: Diagnosis not present

## 2015-12-20 DIAGNOSIS — Z888 Allergy status to other drugs, medicaments and biological substances status: Secondary | ICD-10-CM | POA: Diagnosis not present

## 2015-12-21 DIAGNOSIS — M8468XA Pathological fracture in other disease, other site, initial encounter for fracture: Secondary | ICD-10-CM | POA: Diagnosis not present

## 2015-12-21 DIAGNOSIS — I10 Essential (primary) hypertension: Secondary | ICD-10-CM | POA: Diagnosis not present

## 2015-12-21 DIAGNOSIS — M4856XA Collapsed vertebra, not elsewhere classified, lumbar region, initial encounter for fracture: Secondary | ICD-10-CM | POA: Diagnosis not present

## 2015-12-21 DIAGNOSIS — Z9851 Tubal ligation status: Secondary | ICD-10-CM | POA: Diagnosis not present

## 2015-12-21 DIAGNOSIS — Z791 Long term (current) use of non-steroidal anti-inflammatories (NSAID): Secondary | ICD-10-CM | POA: Diagnosis not present

## 2015-12-21 DIAGNOSIS — Z79899 Other long term (current) drug therapy: Secondary | ICD-10-CM | POA: Diagnosis not present

## 2015-12-21 DIAGNOSIS — K589 Irritable bowel syndrome without diarrhea: Secondary | ICD-10-CM | POA: Diagnosis not present

## 2015-12-21 DIAGNOSIS — S32010A Wedge compression fracture of first lumbar vertebra, initial encounter for closed fracture: Secondary | ICD-10-CM | POA: Diagnosis not present

## 2015-12-21 DIAGNOSIS — W19XXXA Unspecified fall, initial encounter: Secondary | ICD-10-CM | POA: Diagnosis not present

## 2015-12-21 DIAGNOSIS — E7800 Pure hypercholesterolemia, unspecified: Secondary | ICD-10-CM | POA: Diagnosis not present

## 2015-12-21 DIAGNOSIS — Z09 Encounter for follow-up examination after completed treatment for conditions other than malignant neoplasm: Secondary | ICD-10-CM | POA: Diagnosis not present

## 2015-12-21 DIAGNOSIS — Z981 Arthrodesis status: Secondary | ICD-10-CM | POA: Diagnosis not present

## 2015-12-21 DIAGNOSIS — M797 Fibromyalgia: Secondary | ICD-10-CM | POA: Diagnosis not present

## 2015-12-21 DIAGNOSIS — Z79891 Long term (current) use of opiate analgesic: Secondary | ICD-10-CM | POA: Diagnosis not present

## 2015-12-21 DIAGNOSIS — E119 Type 2 diabetes mellitus without complications: Secondary | ICD-10-CM | POA: Diagnosis not present

## 2015-12-30 ENCOUNTER — Encounter: Payer: Self-pay | Admitting: Gastroenterology

## 2016-01-11 DIAGNOSIS — E559 Vitamin D deficiency, unspecified: Secondary | ICD-10-CM | POA: Diagnosis not present

## 2016-01-11 DIAGNOSIS — Z6832 Body mass index (BMI) 32.0-32.9, adult: Secondary | ICD-10-CM | POA: Diagnosis not present

## 2016-01-11 DIAGNOSIS — K3184 Gastroparesis: Secondary | ICD-10-CM | POA: Diagnosis not present

## 2016-01-11 DIAGNOSIS — R112 Nausea with vomiting, unspecified: Secondary | ICD-10-CM | POA: Diagnosis not present

## 2016-01-11 DIAGNOSIS — G5603 Carpal tunnel syndrome, bilateral upper limbs: Secondary | ICD-10-CM | POA: Diagnosis not present

## 2016-01-11 DIAGNOSIS — I10 Essential (primary) hypertension: Secondary | ICD-10-CM | POA: Diagnosis not present

## 2016-01-11 DIAGNOSIS — E1165 Type 2 diabetes mellitus with hyperglycemia: Secondary | ICD-10-CM | POA: Diagnosis not present

## 2016-01-11 DIAGNOSIS — D519 Vitamin B12 deficiency anemia, unspecified: Secondary | ICD-10-CM | POA: Diagnosis not present

## 2016-01-24 DIAGNOSIS — G5600 Carpal tunnel syndrome, unspecified upper limb: Secondary | ICD-10-CM | POA: Diagnosis not present

## 2016-01-24 DIAGNOSIS — Z79899 Other long term (current) drug therapy: Secondary | ICD-10-CM | POA: Diagnosis not present

## 2016-01-24 DIAGNOSIS — M654 Radial styloid tenosynovitis [de Quervain]: Secondary | ICD-10-CM | POA: Diagnosis not present

## 2016-01-24 DIAGNOSIS — G8929 Other chronic pain: Secondary | ICD-10-CM | POA: Diagnosis not present

## 2016-01-29 DIAGNOSIS — R300 Dysuria: Secondary | ICD-10-CM | POA: Diagnosis not present

## 2016-01-29 DIAGNOSIS — Z6832 Body mass index (BMI) 32.0-32.9, adult: Secondary | ICD-10-CM | POA: Diagnosis not present

## 2016-01-29 DIAGNOSIS — R109 Unspecified abdominal pain: Secondary | ICD-10-CM | POA: Diagnosis not present

## 2016-01-29 DIAGNOSIS — R350 Frequency of micturition: Secondary | ICD-10-CM | POA: Diagnosis not present

## 2016-02-14 DIAGNOSIS — M791 Myalgia: Secondary | ICD-10-CM | POA: Diagnosis not present

## 2016-02-21 DIAGNOSIS — G8929 Other chronic pain: Secondary | ICD-10-CM | POA: Diagnosis not present

## 2016-02-21 DIAGNOSIS — R300 Dysuria: Secondary | ICD-10-CM | POA: Diagnosis not present

## 2016-02-21 DIAGNOSIS — M654 Radial styloid tenosynovitis [de Quervain]: Secondary | ICD-10-CM | POA: Diagnosis not present

## 2016-02-21 DIAGNOSIS — G5602 Carpal tunnel syndrome, left upper limb: Secondary | ICD-10-CM | POA: Diagnosis not present

## 2016-02-21 DIAGNOSIS — M791 Myalgia: Secondary | ICD-10-CM | POA: Diagnosis not present

## 2016-02-22 DIAGNOSIS — N390 Urinary tract infection, site not specified: Secondary | ICD-10-CM | POA: Diagnosis not present

## 2016-02-22 DIAGNOSIS — R9341 Abnormal radiologic findings on diagnostic imaging of renal pelvis, ureter, or bladder: Secondary | ICD-10-CM | POA: Diagnosis not present

## 2016-02-22 DIAGNOSIS — Z87440 Personal history of urinary (tract) infections: Secondary | ICD-10-CM | POA: Diagnosis not present

## 2016-02-24 ENCOUNTER — Encounter: Payer: Self-pay | Admitting: Cardiovascular Disease

## 2016-02-24 ENCOUNTER — Ambulatory Visit (INDEPENDENT_AMBULATORY_CARE_PROVIDER_SITE_OTHER): Payer: Medicare Other | Admitting: Cardiovascular Disease

## 2016-02-24 VITALS — BP 142/82 | HR 82 | Ht 62.0 in | Wt 192.0 lb

## 2016-02-24 DIAGNOSIS — E7800 Pure hypercholesterolemia, unspecified: Secondary | ICD-10-CM

## 2016-02-24 DIAGNOSIS — I10 Essential (primary) hypertension: Secondary | ICD-10-CM | POA: Diagnosis not present

## 2016-02-24 DIAGNOSIS — R600 Localized edema: Secondary | ICD-10-CM

## 2016-02-24 DIAGNOSIS — R55 Syncope and collapse: Secondary | ICD-10-CM | POA: Diagnosis not present

## 2016-02-24 NOTE — Progress Notes (Signed)
SUBJECTIVE: The patient presents for follow-up of syncope and hypertension. She recently underwent 3 back surgeries. Blood pressure has been fluctuating. She denies chest pain and shortness of breath. She has been taking triamterene as needed for leg swelling.   Review of Systems: As per "subjective", otherwise negative.  Allergies  Allergen Reactions  . Ativan [Lorazepam]     Lowered blood pressure  . Ethosuximide Other (See Comments)    Does not know   . Iodine 131 Other (See Comments)    Blacked out   . Phenytoin Sodium Extended Other (See Comments)    Killed white blood cell at age of 60   . Sulfa Antibiotics Other (See Comments)    Bones     Current Outpatient Prescriptions  Medication Sig Dispense Refill  . aspirin-acetaminophen-caffeine (EXCEDRIN MIGRAINE) 250-250-65 MG per tablet Take 1 tablet by mouth every 6 (six) hours as needed. headache     . baclofen (LIORESAL) 10 MG tablet Take 1 tablet by mouth 4 (four) times daily.     Marland Kitchen HYDROcodone-acetaminophen (NORCO) 10-325 MG tablet Take 1 tablet by mouth every 6 (six) hours as needed.    . hyoscyamine (LEVSIN, ANASPAZ) 0.125 MG tablet Take 0.125 mg by mouth every 4 (four) hours as needed. Gi pain    . insulin glargine (LANTUS) 100 UNIT/ML injection Inject into the skin. Sliding scale    . insulin lispro (HUMALOG) 100 UNIT/ML injection Inject into the skin. Sliding scale    . loratadine (CLARITIN) 10 MG tablet Take 10 mg by mouth daily as needed. For allergies    . losartan (COZAAR) 25 MG tablet Take 1 tablet (25 mg total) by mouth at bedtime. 90 tablet 3  . metoprolol succinate (TOPROL-XL) 25 MG 24 hr tablet Take 0.5 tablets (12.5 mg total) by mouth daily. Take with or immediately following a meal. 30 tablet 0  . ondansetron (ZOFRAN) 8 MG tablet Take 16 mg by mouth 2 (two) times daily. Reported on 05/07/2015    . pantoprazole (PROTONIX) 40 MG tablet Take 1 tablet by mouth 2 (two) times daily.     . pramipexole  (MIRAPEX) 0.5 MG tablet Take 0.5 mg by mouth daily.    . promethazine (PHENERGAN) 25 MG tablet Take 1 tablet by mouth 3 (three) times daily as needed.     No current facility-administered medications for this visit.     Past Medical History:  Diagnosis Date  . Cervical spine disease   . DM (diabetes mellitus) ([place])   . Dysphagia   . Fibromyalgia   . GERD (gastroesophageal reflux disease)   . HTN (hypertension)   . Hypercholesteremia   . PONV (postoperative nausea and vomiting)   . Seasonal allergies   . Seizures ([place])    last one > 20 years ago    Past Surgical History:  Procedure Laterality Date  . CARPAL TUNNEL RELEASE  RIGHT  . CERVICAL SPINE SURGERY  APR 2012  . COLONOSCOPY  2008 [place]    [place] [facility]  . ESOPHAGOGASTRODUODENOSCOPY  01/24/2011   [id number] 2o TO CERVICAL PLATES IN PT'S NECK AND MILD ESO MOTILITY DISORDER/Mild gastritis  . ESOPHAGOGASTRODUODENOSCOPY  03/03/11   [id number] gastritis   . FLEXIBLE SIGMOIDOSCOPY  01/24/2011   SLF: Internal Hemorrhoids  . SAVORY DILATION  01/24/2011   Procedure: SAVORY DILATION;  Surgeon: Dorothyann Peng, MD;  Location: [facility];  Service: Endoscopy;  Laterality: N/A;  . SHOULDER SURGERY  RIGHT  . TUBAL LIGATION  Social History   Social History  . Marital status: Widowed    Spouse name: N/A  . Number of children: N/A  . Years of education: N/A   Occupational History  . Not on file.   Social History Main Topics  . Smoking status: Former Smoker    Packs/day: 0.50    Years: 2.00    Types: Cigarettes    Start date: 10/12/1966    Quit date: 10/11/1968  . Smokeless tobacco: Never Used  . Alcohol use 0.0 oz/week     Comment: occasionally  . Drug use: No  . Sexual activity: Not on file   Other Topics Concern  . Not on file   Social History Narrative   MARRIED, 2 KIDS, YOUNGEST 60 YO.   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 Neck: No JVD, no thyromegaly. Lungs: Clear to auscultation bilaterally with normal respiratory effort. CV: Nondisplaced PMI.  Regular rate and rhythm, normal S1/S2, no S3/S4, no murmur. No pretibial or periankle edema.    Abdomen: Soft, nontender, no distention.  Neurologic: Alert and oriented.  Psych: Normal affect. Skin: Normal. Musculoskeletal: No gross deformities.    ECG: Most recent ECG reviewed.      ASSESSMENT AND PLAN: 1. Syncope: No recurrences since discontinuation of diuretic. Prior episodes possibly due to autonomic neuropathy given diabetes and hypotension, which appeared to precipitate episodes.  2. Essential HTN: Elevated. Fluctuating BP likely due to autonomic neuropathy. Previously encouraged exercising which will help to regulate vascular tone once she recovers from surgeries. I have asked her to keep legs elevated to raise BP when she notices it is dropping, and to push fluid intake. I also recommended compression stockings.  3. Hyperlipidemia: Statin intolerant. Will continue Zetia 10 mg in an effort to modify cardiovascular risk factors.  4. Leg swelling: Can take triamterene as needed. Advised not to take high doses. I also recommended compression stockings.   Dispo: fu 1 yr  Kate Sable, M.D., F.A.C.C.

## 2016-02-24 NOTE — Patient Instructions (Signed)
Medication Instructions:   Please call the office back with dose of Triamterene that you were previously taking.  Continue all other medications.    Labwork: none  Testing/Procedures: none  Follow-Up: Your physician wants you to follow up in:  1 year.  You will receive a reminder letter in the mail one-two months in advance.  If you don't receive a letter, please call our office to schedule the follow up appointment   Any Other Special Instructions Will Be Listed Below (If Applicable).  If you need a refill on your cardiac medications before your next appointment, please call your pharmacy.

## 2016-03-01 ENCOUNTER — Telehealth: Payer: Self-pay | Admitting: *Deleted

## 2016-03-01 NOTE — Telephone Encounter (Signed)
Patient last seen on 02/24/2016.  She is calling back with dose on her Triamterene/HCTZ as he did not know dose that day -  37.5 / 25mg .

## 2016-03-06 DIAGNOSIS — G5601 Carpal tunnel syndrome, right upper limb: Secondary | ICD-10-CM | POA: Diagnosis not present

## 2016-03-06 DIAGNOSIS — G5602 Carpal tunnel syndrome, left upper limb: Secondary | ICD-10-CM | POA: Diagnosis not present

## 2016-03-07 NOTE — Telephone Encounter (Signed)
Please advise on what dose you want patient on.

## 2016-03-08 NOTE — Telephone Encounter (Signed)
37.5/12.5 

## 2016-03-09 NOTE — Telephone Encounter (Signed)
Patient notified.  Patient stated that she had both medications, Triamterene and HCTZ.  Does not have the combination pill.  Stated she had been taking one or the other.  Stated that when she takes either of those, she does not take her Toprol because she says it makes her BP drop too low.    Recently the last week though, her BP has been spiking up.  170/106 yest morning after getting up.  Stated she can not relate elevation to anything, not necessarily having any pain during that time.    Message fwd to provider for further advice.

## 2016-03-09 NOTE — Telephone Encounter (Signed)
This remains unclear to me. Patient called back and said she was on trimaeterene/HCTZ 37.5/25 originally but now it appears she has separate prescriptons for the triamterence and HCTZ. I do not see any of these listed in her chart. What are the doses of both and what does the Rx say on how to take    Zandra Abts MD

## 2016-03-09 NOTE — Telephone Encounter (Signed)
Left message to return call 

## 2016-03-14 NOTE — Telephone Encounter (Signed)
Did we get any more clarity on this?   Zandra Abts MD

## 2016-03-14 NOTE — Telephone Encounter (Signed)
Left message to return call 

## 2016-03-15 NOTE — Telephone Encounter (Signed)
Will fwd back to Dr. Bronson Ing for advice as he is back in the office tomorrow.

## 2016-03-16 DIAGNOSIS — R300 Dysuria: Secondary | ICD-10-CM | POA: Diagnosis not present

## 2016-03-16 DIAGNOSIS — Z6832 Body mass index (BMI) 32.0-32.9, adult: Secondary | ICD-10-CM | POA: Diagnosis not present

## 2016-03-16 MED ORDER — LOSARTAN POTASSIUM 50 MG PO TABS: 50.0000 mg | ORAL_TABLET | Freq: Every day | ORAL | 3 refills | Status: DC

## 2016-03-16 NOTE — Telephone Encounter (Signed)
Is she still taking losartan 25 mg q pm?

## 2016-03-16 NOTE — Telephone Encounter (Signed)
Increase to 50 mg q pm.

## 2016-03-16 NOTE — Telephone Encounter (Signed)
Patient confirmed that she is taking losartan 25 mg q pm.

## 2016-03-16 NOTE — Telephone Encounter (Signed)
Patient returned call

## 2016-03-16 NOTE — Telephone Encounter (Signed)
Patient informed and verbalized understanding of plan. 

## 2016-03-22 DIAGNOSIS — M47816 Spondylosis without myelopathy or radiculopathy, lumbar region: Secondary | ICD-10-CM | POA: Diagnosis not present

## 2016-03-27 DIAGNOSIS — M47816 Spondylosis without myelopathy or radiculopathy, lumbar region: Secondary | ICD-10-CM | POA: Diagnosis not present

## 2016-03-30 DIAGNOSIS — M1712 Unilateral primary osteoarthritis, left knee: Secondary | ICD-10-CM | POA: Diagnosis not present

## 2016-03-30 DIAGNOSIS — M25562 Pain in left knee: Secondary | ICD-10-CM | POA: Diagnosis not present

## 2016-04-04 DIAGNOSIS — M47816 Spondylosis without myelopathy or radiculopathy, lumbar region: Secondary | ICD-10-CM | POA: Diagnosis not present

## 2016-04-11 DIAGNOSIS — E1165 Type 2 diabetes mellitus with hyperglycemia: Secondary | ICD-10-CM | POA: Diagnosis not present

## 2016-04-11 DIAGNOSIS — F411 Generalized anxiety disorder: Secondary | ICD-10-CM | POA: Diagnosis not present

## 2016-04-11 DIAGNOSIS — I10 Essential (primary) hypertension: Secondary | ICD-10-CM | POA: Diagnosis not present

## 2016-04-11 DIAGNOSIS — G5603 Carpal tunnel syndrome, bilateral upper limbs: Secondary | ICD-10-CM | POA: Diagnosis not present

## 2016-04-11 DIAGNOSIS — R300 Dysuria: Secondary | ICD-10-CM | POA: Diagnosis not present

## 2016-04-11 DIAGNOSIS — G2581 Restless legs syndrome: Secondary | ICD-10-CM | POA: Diagnosis not present

## 2016-04-11 DIAGNOSIS — Z6832 Body mass index (BMI) 32.0-32.9, adult: Secondary | ICD-10-CM | POA: Diagnosis not present

## 2016-04-11 DIAGNOSIS — E039 Hypothyroidism, unspecified: Secondary | ICD-10-CM | POA: Diagnosis not present

## 2016-04-18 DIAGNOSIS — M47816 Spondylosis without myelopathy or radiculopathy, lumbar region: Secondary | ICD-10-CM | POA: Diagnosis not present

## 2016-05-03 DIAGNOSIS — N390 Urinary tract infection, site not specified: Secondary | ICD-10-CM | POA: Diagnosis not present

## 2016-05-03 DIAGNOSIS — R8290 Unspecified abnormal findings in urine: Secondary | ICD-10-CM | POA: Diagnosis not present

## 2016-05-03 DIAGNOSIS — G894 Chronic pain syndrome: Secondary | ICD-10-CM | POA: Diagnosis not present

## 2016-05-03 DIAGNOSIS — E119 Type 2 diabetes mellitus without complications: Secondary | ICD-10-CM | POA: Diagnosis not present

## 2016-05-04 DIAGNOSIS — Z6832 Body mass index (BMI) 32.0-32.9, adult: Secondary | ICD-10-CM | POA: Diagnosis not present

## 2016-05-04 DIAGNOSIS — R112 Nausea with vomiting, unspecified: Secondary | ICD-10-CM | POA: Diagnosis not present

## 2016-05-04 DIAGNOSIS — Z1389 Encounter for screening for other disorder: Secondary | ICD-10-CM | POA: Diagnosis not present

## 2016-05-04 DIAGNOSIS — G2581 Restless legs syndrome: Secondary | ICD-10-CM | POA: Diagnosis not present

## 2016-05-05 DIAGNOSIS — M545 Low back pain: Secondary | ICD-10-CM | POA: Diagnosis not present

## 2016-05-05 DIAGNOSIS — M48061 Spinal stenosis, lumbar region without neurogenic claudication: Secondary | ICD-10-CM | POA: Diagnosis not present

## 2016-05-05 DIAGNOSIS — T84226D Displacement of internal fixation device of vertebrae, subsequent encounter: Secondary | ICD-10-CM | POA: Diagnosis not present

## 2016-05-05 DIAGNOSIS — M9983 Other biomechanical lesions of lumbar region: Secondary | ICD-10-CM | POA: Diagnosis not present

## 2016-05-05 DIAGNOSIS — M47816 Spondylosis without myelopathy or radiculopathy, lumbar region: Secondary | ICD-10-CM | POA: Diagnosis not present

## 2016-05-10 DIAGNOSIS — M5106 Intervertebral disc disorders with myelopathy, lumbar region: Secondary | ICD-10-CM | POA: Diagnosis not present

## 2016-05-10 DIAGNOSIS — M4800 Spinal stenosis, site unspecified: Secondary | ICD-10-CM | POA: Diagnosis not present

## 2016-05-10 DIAGNOSIS — S32010A Wedge compression fracture of first lumbar vertebra, initial encounter for closed fracture: Secondary | ICD-10-CM | POA: Diagnosis not present

## 2016-05-10 DIAGNOSIS — M47816 Spondylosis without myelopathy or radiculopathy, lumbar region: Secondary | ICD-10-CM | POA: Diagnosis not present

## 2016-05-24 DIAGNOSIS — M545 Low back pain: Secondary | ICD-10-CM | POA: Diagnosis not present

## 2016-05-24 DIAGNOSIS — M5136 Other intervertebral disc degeneration, lumbar region: Secondary | ICD-10-CM | POA: Diagnosis not present

## 2016-05-24 DIAGNOSIS — G894 Chronic pain syndrome: Secondary | ICD-10-CM | POA: Diagnosis not present

## 2016-05-24 DIAGNOSIS — T849XXA Unspecified complication of internal orthopedic prosthetic device, implant and graft, initial encounter: Secondary | ICD-10-CM | POA: Diagnosis not present

## 2016-05-24 DIAGNOSIS — Z79891 Long term (current) use of opiate analgesic: Secondary | ICD-10-CM | POA: Diagnosis not present

## 2016-05-31 ENCOUNTER — Other Ambulatory Visit (HOSPITAL_COMMUNITY): Payer: Self-pay | Admitting: Neurosurgery

## 2016-05-31 DIAGNOSIS — Z780 Asymptomatic menopausal state: Secondary | ICD-10-CM

## 2016-05-31 DIAGNOSIS — M859 Disorder of bone density and structure, unspecified: Secondary | ICD-10-CM

## 2016-06-02 ENCOUNTER — Other Ambulatory Visit (HOSPITAL_COMMUNITY): Payer: Medicare Other

## 2016-06-05 ENCOUNTER — Ambulatory Visit (HOSPITAL_COMMUNITY)
Admission: RE | Admit: 2016-06-05 | Discharge: 2016-06-05 | Disposition: A | Discharge status: Home / Self Care | Payer: Medicare Other | Source: Ambulatory Visit | Attending: Neurosurgery | Admitting: Neurosurgery

## 2016-06-05 DIAGNOSIS — M85852 Other specified disorders of bone density and structure, left thigh: Secondary | ICD-10-CM | POA: Diagnosis not present

## 2016-06-05 DIAGNOSIS — M85832 Other specified disorders of bone density and structure, left forearm: Secondary | ICD-10-CM | POA: Insufficient documentation

## 2016-06-05 DIAGNOSIS — Z780 Asymptomatic menopausal state: Secondary | ICD-10-CM | POA: Insufficient documentation

## 2016-06-05 DIAGNOSIS — M8589 Other specified disorders of bone density and structure, multiple sites: Secondary | ICD-10-CM | POA: Diagnosis not present

## 2016-06-05 DIAGNOSIS — M859 Disorder of bone density and structure, unspecified: Secondary | ICD-10-CM

## 2016-06-06 DIAGNOSIS — M25562 Pain in left knee: Secondary | ICD-10-CM | POA: Diagnosis not present

## 2016-06-06 DIAGNOSIS — M778 Other enthesopathies, not elsewhere classified: Secondary | ICD-10-CM | POA: Diagnosis not present

## 2016-06-06 DIAGNOSIS — M25532 Pain in left wrist: Secondary | ICD-10-CM | POA: Diagnosis not present

## 2016-06-06 DIAGNOSIS — Z6833 Body mass index (BMI) 33.0-33.9, adult: Secondary | ICD-10-CM | POA: Diagnosis not present

## 2016-06-08 DIAGNOSIS — M859 Disorder of bone density and structure, unspecified: Secondary | ICD-10-CM | POA: Diagnosis not present

## 2016-06-08 DIAGNOSIS — R609 Edema, unspecified: Secondary | ICD-10-CM | POA: Diagnosis not present

## 2016-06-08 DIAGNOSIS — M4800 Spinal stenosis, site unspecified: Secondary | ICD-10-CM | POA: Diagnosis not present

## 2016-06-14 DIAGNOSIS — N390 Urinary tract infection, site not specified: Secondary | ICD-10-CM | POA: Diagnosis not present

## 2016-06-20 DIAGNOSIS — T849XXA Unspecified complication of internal orthopedic prosthetic device, implant and graft, initial encounter: Secondary | ICD-10-CM | POA: Diagnosis not present

## 2016-06-20 DIAGNOSIS — M545 Low back pain: Secondary | ICD-10-CM | POA: Diagnosis not present

## 2016-06-20 DIAGNOSIS — G894 Chronic pain syndrome: Secondary | ICD-10-CM | POA: Diagnosis not present

## 2016-06-20 DIAGNOSIS — M5136 Other intervertebral disc degeneration, lumbar region: Secondary | ICD-10-CM | POA: Diagnosis not present

## 2016-06-20 DIAGNOSIS — Z79891 Long term (current) use of opiate analgesic: Secondary | ICD-10-CM | POA: Diagnosis not present

## 2016-06-21 DIAGNOSIS — M797 Fibromyalgia: Secondary | ICD-10-CM | POA: Diagnosis not present

## 2016-06-21 DIAGNOSIS — M8580 Other specified disorders of bone density and structure, unspecified site: Secondary | ICD-10-CM | POA: Diagnosis not present

## 2016-06-26 ENCOUNTER — Encounter: Payer: Self-pay | Admitting: Cardiovascular Disease

## 2016-06-26 DIAGNOSIS — Z8249 Family history of ischemic heart disease and other diseases of the circulatory system: Secondary | ICD-10-CM | POA: Diagnosis not present

## 2016-06-26 DIAGNOSIS — M797 Fibromyalgia: Secondary | ICD-10-CM | POA: Diagnosis not present

## 2016-06-26 DIAGNOSIS — I10 Essential (primary) hypertension: Secondary | ICD-10-CM | POA: Diagnosis not present

## 2016-06-26 DIAGNOSIS — N3000 Acute cystitis without hematuria: Secondary | ICD-10-CM | POA: Diagnosis not present

## 2016-06-26 DIAGNOSIS — Z794 Long term (current) use of insulin: Secondary | ICD-10-CM | POA: Diagnosis not present

## 2016-06-26 DIAGNOSIS — I959 Hypotension, unspecified: Secondary | ICD-10-CM | POA: Diagnosis not present

## 2016-06-26 DIAGNOSIS — E7800 Pure hypercholesterolemia, unspecified: Secondary | ICD-10-CM | POA: Diagnosis not present

## 2016-06-26 DIAGNOSIS — E119 Type 2 diabetes mellitus without complications: Secondary | ICD-10-CM | POA: Diagnosis not present

## 2016-06-26 DIAGNOSIS — R55 Syncope and collapse: Secondary | ICD-10-CM | POA: Diagnosis not present

## 2016-07-12 DIAGNOSIS — E1143 Type 2 diabetes mellitus with diabetic autonomic (poly)neuropathy: Secondary | ICD-10-CM | POA: Diagnosis not present

## 2016-07-12 DIAGNOSIS — K210 Gastro-esophageal reflux disease with esophagitis: Secondary | ICD-10-CM | POA: Diagnosis not present

## 2016-07-12 DIAGNOSIS — E1165 Type 2 diabetes mellitus with hyperglycemia: Secondary | ICD-10-CM | POA: Diagnosis not present

## 2016-07-12 DIAGNOSIS — F411 Generalized anxiety disorder: Secondary | ICD-10-CM | POA: Diagnosis not present

## 2016-07-12 DIAGNOSIS — R748 Abnormal levels of other serum enzymes: Secondary | ICD-10-CM | POA: Diagnosis not present

## 2016-07-12 DIAGNOSIS — I10 Essential (primary) hypertension: Secondary | ICD-10-CM | POA: Diagnosis not present

## 2016-07-12 DIAGNOSIS — E039 Hypothyroidism, unspecified: Secondary | ICD-10-CM | POA: Diagnosis not present

## 2016-07-12 DIAGNOSIS — E559 Vitamin D deficiency, unspecified: Secondary | ICD-10-CM | POA: Diagnosis not present

## 2016-07-12 DIAGNOSIS — E7800 Pure hypercholesterolemia, unspecified: Secondary | ICD-10-CM | POA: Diagnosis not present

## 2016-07-13 DIAGNOSIS — M79603 Pain in arm, unspecified: Secondary | ICD-10-CM | POA: Diagnosis not present

## 2016-07-13 DIAGNOSIS — R3915 Urgency of urination: Secondary | ICD-10-CM | POA: Diagnosis not present

## 2016-08-09 DIAGNOSIS — M47816 Spondylosis without myelopathy or radiculopathy, lumbar region: Secondary | ICD-10-CM | POA: Diagnosis not present

## 2016-08-16 DIAGNOSIS — Z981 Arthrodesis status: Secondary | ICD-10-CM | POA: Diagnosis not present

## 2016-08-16 DIAGNOSIS — M47816 Spondylosis without myelopathy or radiculopathy, lumbar region: Secondary | ICD-10-CM | POA: Diagnosis not present

## 2016-08-16 DIAGNOSIS — M545 Low back pain: Secondary | ICD-10-CM | POA: Diagnosis not present

## 2016-08-17 DIAGNOSIS — G894 Chronic pain syndrome: Secondary | ICD-10-CM | POA: Diagnosis not present

## 2016-08-17 DIAGNOSIS — G5600 Carpal tunnel syndrome, unspecified upper limb: Secondary | ICD-10-CM | POA: Diagnosis not present

## 2016-08-17 DIAGNOSIS — T849XXA Unspecified complication of internal orthopedic prosthetic device, implant and graft, initial encounter: Secondary | ICD-10-CM | POA: Diagnosis not present

## 2016-08-17 DIAGNOSIS — M5136 Other intervertebral disc degeneration, lumbar region: Secondary | ICD-10-CM | POA: Diagnosis not present

## 2016-08-17 DIAGNOSIS — Z79891 Long term (current) use of opiate analgesic: Secondary | ICD-10-CM | POA: Diagnosis not present

## 2016-09-14 DIAGNOSIS — Z79891 Long term (current) use of opiate analgesic: Secondary | ICD-10-CM | POA: Diagnosis not present

## 2016-09-14 DIAGNOSIS — G894 Chronic pain syndrome: Secondary | ICD-10-CM | POA: Diagnosis not present

## 2016-09-14 DIAGNOSIS — M47817 Spondylosis without myelopathy or radiculopathy, lumbosacral region: Secondary | ICD-10-CM | POA: Diagnosis not present

## 2016-09-14 DIAGNOSIS — G5600 Carpal tunnel syndrome, unspecified upper limb: Secondary | ICD-10-CM | POA: Diagnosis not present

## 2016-09-23 ENCOUNTER — Other Ambulatory Visit: Payer: Self-pay | Admitting: Adult Health

## 2016-09-26 ENCOUNTER — Encounter: Payer: Self-pay | Admitting: Cardiovascular Disease

## 2016-09-26 ENCOUNTER — Ambulatory Visit (INDEPENDENT_AMBULATORY_CARE_PROVIDER_SITE_OTHER): Payer: Medicare Other | Admitting: Cardiovascular Disease

## 2016-09-26 ENCOUNTER — Encounter: Payer: Self-pay | Admitting: *Deleted

## 2016-09-26 VITALS — BP 144/75 | HR 85 | Ht 62.0 in | Wt 187.2 lb

## 2016-09-26 DIAGNOSIS — R55 Syncope and collapse: Secondary | ICD-10-CM

## 2016-09-26 DIAGNOSIS — R600 Localized edema: Secondary | ICD-10-CM

## 2016-09-26 DIAGNOSIS — Z6832 Body mass index (BMI) 32.0-32.9, adult: Secondary | ICD-10-CM | POA: Diagnosis not present

## 2016-09-26 DIAGNOSIS — M797 Fibromyalgia: Secondary | ICD-10-CM | POA: Diagnosis not present

## 2016-09-26 DIAGNOSIS — G2581 Restless legs syndrome: Secondary | ICD-10-CM | POA: Diagnosis not present

## 2016-09-26 DIAGNOSIS — G5603 Carpal tunnel syndrome, bilateral upper limbs: Secondary | ICD-10-CM | POA: Diagnosis not present

## 2016-09-26 DIAGNOSIS — R5382 Chronic fatigue, unspecified: Secondary | ICD-10-CM | POA: Diagnosis not present

## 2016-09-26 DIAGNOSIS — I10 Essential (primary) hypertension: Secondary | ICD-10-CM | POA: Diagnosis not present

## 2016-09-26 MED ORDER — LOSARTAN POTASSIUM 50 MG PO TABS: 75.0000 mg | ORAL_TABLET | Freq: Every day | ORAL | 3 refills | Status: DC

## 2016-09-26 NOTE — Progress Notes (Signed)
SUBJECTIVE: The patient presents for follow-up of syncope and hypertension. She said she had a near syncopal episode about 3 months ago when blood pressure was 77/46. She was reportedly evaluated in the [facility] ED at that time.  She denies any recurrence of frank syncope. She also denies chest pain, palpitations, and shortness of breath.  She was scared because her blood pressure was 176/116 yesterday. She reports having several blood pressure spikes in the last few days. She checks her blood pressure 3-4 times per day.  She has had several back surgeries and suffers from chronic back pain and takes Norco for this.   Review of Systems: As per "subjective", otherwise negative.  Allergies  Allergen Reactions  . Ativan [Lorazepam]     Lowered blood pressure  . Ethosuximide Other (See Comments)    Does not know   . Iodine 131 Other (See Comments)    Blacked out   . Phenytoin Sodium Extended Other (See Comments)    Killed white blood cell at age of 59   . Sulfa Antibiotics Other (See Comments)    Bones     Current Outpatient Prescriptions  Medication Sig Dispense Refill  . aspirin-acetaminophen-caffeine (EXCEDRIN MIGRAINE) 250-250-65 MG per tablet Take 1 tablet by mouth every 6 (six) hours as needed. headache     . HYDROcodone-acetaminophen (NORCO) 10-325 MG tablet Take 1 tablet by mouth every 6 (six) hours as needed.    Marland Kitchen ibuprofen (ADVIL,MOTRIN) 600 MG tablet Take 1 tablet by mouth every 8 (eight) hours as needed.    . insulin glargine (LANTUS) 100 UNIT/ML injection Inject into the skin. Sliding scale    . insulin lispro (HUMALOG) 100 UNIT/ML injection Inject into the skin. Sliding scale    . loratadine (CLARITIN) 10 MG tablet Take 10 mg by mouth daily as needed. For allergies    . losartan (COZAAR) 50 MG tablet Take 1 tablet (50 mg total) by mouth daily. 90 tablet 3  . metaxalone (SKELAXIN) 800 MG tablet Take 1 tablet by mouth 3 (three) times daily.    .  metoprolol succinate (TOPROL-XL) 25 MG 24 hr tablet Take 0.5 tablets (12.5 mg total) by mouth daily. Take with or immediately following a meal. 30 tablet 0  . metoprolol succinate (TOPROL-XL) 25 MG 24 hr tablet TAKE ONE-HALF (1/2) TABLET DAILY. TAKE WITH OR IMMEDIATELY FOLLOWING A MEAL 30 tablet 3  . mirtazapine (REMERON) 15 MG tablet Take 1 tablet by mouth daily.    . pantoprazole (PROTONIX) 40 MG tablet Take 1 tablet by mouth 2 (two) times daily.     . pramipexole (MIRAPEX) 1 MG tablet Take 1 tablet by mouth 2 (two) times daily.    . promethazine (PHENERGAN) 25 MG tablet Take 1 tablet by mouth 3 (three) times daily as needed.    . ranitidine (ZANTAC) 300 MG tablet Take 1 tablet by mouth 2 (two) times daily.     No current facility-administered medications for this visit.     Past Medical History:  Diagnosis Date  . Cervical spine disease   . DM (diabetes mellitus) ([place])   . Dysphagia   . Fibromyalgia   . GERD (gastroesophageal reflux disease)   . HTN (hypertension)   . Hypercholesteremia   . PONV (postoperative nausea and vomiting)   . Seasonal allergies   . Seizures ([place])    last one > 20 years ago    Past Surgical History:  Procedure Laterality Date  . CARPAL TUNNEL RELEASE  RIGHT  . CERVICAL SPINE SURGERY  APR 2012  . COLONOSCOPY  2008 [place]    FLEISCHMAN NL  . ESOPHAGOGASTRODUODENOSCOPY  01/24/2011   OZH:YQMVHQION 2o TO CERVICAL PLATES IN PT'S NECK AND MILD ESO MOTILITY DISORDER/Mild gastritis  . ESOPHAGOGASTRODUODENOSCOPY  03/03/11   GEX:BMWU gastritis   . FLEXIBLE SIGMOIDOSCOPY  01/24/2011   SLF: Internal Hemorrhoids  . SAVORY DILATION  01/24/2011   Procedure: SAVORY DILATION;  Surgeon: Dorothyann Peng, MD;  [facility];  Service: Endoscopy;  Laterality: N/A;  . SHOULDER SURGERY  RIGHT  . TUBAL LIGATION      Social History   Social History  . Marital status: Widowed    Spouse name: N/A  . Number of children: N/A  . Years of education: N/A    Occupational History  . Not on file.   Social History Main Topics  . Smoking status: Former Smoker    Packs/day: 0.50    Years: 2.00    Types: Cigarettes    Start date: 10/12/1966    Quit date: 10/11/1968  . Smokeless tobacco: Never Used  . Alcohol use 0.0 oz/week     Comment: occasionally  . Drug use: No  . Sexual activity: Not on file   Other Topics Concern  . Not on file   Social History Narrative   MARRIED, 2 KIDS, YOUNGEST 90 YO.   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 Neck: No JVD, no thyromegaly. Lungs: Clear to auscultation bilaterally with normal respiratory effort. CV: Nondisplaced PMI.  Regular rate and rhythm, normal S1/S2, no S3/S4, no murmur. No pretibial or periankle edema.     Abdomen: Soft, nontender, no distention.  Neurologic: Alert and oriented.  Psych: Normal affect. Skin: Normal. Musculoskeletal: No gross deformities.    ECG: Most recent ECG reviewed.   Labs: Lab Results  Component Value Date/Time   ALT 25 07/21/2010 11:03 AM   TSH 3.994 12/28/2010 04:37 PM   HGB 14.5 12/28/2010 04:37 PM     Lipids: No results found for: LDLCALC, LDLDIRECT, CHOL, TRIG, HDL     ASSESSMENT AND PLAN: 1. Syncope: No recurrences since discontinuation of diuretic. Prior episodes possibly due to autonomic neuropathy given diabetes and hypotension, which appeared to precipitate episodes. I told her that opioid use for back pain may cause drops in blood pressure and she should lie down after taking these medications to avoid near syncope if BP drops.  2. Essential HTN: Elevated. I will increase losartan  to 75 mg every evening. I've asked her to check her blood pressure 4 times per week for the next 6 weeks and to inform me of these values.  Fluctuating BP likely due to autonomic neuropathy. Previously encouraged exercising which will help to regulate vascular tone. I have asked her to keep legs elevated to raise BP when she notices it is dropping, and to push fluid intake. I also recommended compression stockings.  3. Hyperlipidemia: Statin intolerant. No longer on Zetia.     Disposition: Follow up 1 year.  Kate Sable, M.D., F.A.C.C.

## 2016-09-26 NOTE — Patient Instructions (Signed)
Medication Instructions:  Your physician has recommended you make the following change in your medication:   Begin taking losartan 75 mg every evening  Labwork: NONE  Testing/Procedures: NONE  Follow-Up: Your physician wants you to follow-up in: [place]. You will receive a reminder letter in the mail two months in advance. If you don't receive a letter, please call our office to schedule the follow-up appointment.  Any Other Special Instructions Will Be Listed Below (If Applicable). Your physician has requested that you regularly monitor and record your blood pressure readings at home 3-4 TIMES A WEEK FOR 6 WEEKS. Please use the same machine at the same time of day to check your readings and record them to bring to your follow-up visit.   If you need a refill on your cardiac medications before your next appointment, please call your pharmacy.

## 2016-11-01 DIAGNOSIS — M47816 Spondylosis without myelopathy or radiculopathy, lumbar region: Secondary | ICD-10-CM | POA: Diagnosis not present

## 2016-11-01 DIAGNOSIS — G8929 Other chronic pain: Secondary | ICD-10-CM | POA: Diagnosis not present

## 2016-11-01 DIAGNOSIS — M797 Fibromyalgia: Secondary | ICD-10-CM | POA: Diagnosis not present

## 2016-11-01 DIAGNOSIS — M791 Myalgia: Secondary | ICD-10-CM | POA: Diagnosis not present

## 2016-11-01 DIAGNOSIS — Z79891 Long term (current) use of opiate analgesic: Secondary | ICD-10-CM | POA: Diagnosis not present

## 2016-11-01 DIAGNOSIS — M170 Bilateral primary osteoarthritis of knee: Secondary | ICD-10-CM | POA: Diagnosis not present

## 2016-11-01 DIAGNOSIS — G5603 Carpal tunnel syndrome, bilateral upper limbs: Secondary | ICD-10-CM | POA: Diagnosis not present

## 2016-11-01 DIAGNOSIS — M47812 Spondylosis without myelopathy or radiculopathy, cervical region: Secondary | ICD-10-CM | POA: Diagnosis not present

## 2016-11-07 DIAGNOSIS — M170 Bilateral primary osteoarthritis of knee: Secondary | ICD-10-CM | POA: Diagnosis not present

## 2016-11-07 DIAGNOSIS — Z79891 Long term (current) use of opiate analgesic: Secondary | ICD-10-CM | POA: Diagnosis not present

## 2016-11-07 DIAGNOSIS — Z882 Allergy status to sulfonamides status: Secondary | ICD-10-CM | POA: Diagnosis not present

## 2016-11-07 DIAGNOSIS — M47812 Spondylosis without myelopathy or radiculopathy, cervical region: Secondary | ICD-10-CM | POA: Diagnosis not present

## 2016-11-07 DIAGNOSIS — G5782 Other specified mononeuropathies of left lower limb: Secondary | ICD-10-CM | POA: Diagnosis not present

## 2016-11-07 DIAGNOSIS — M1712 Unilateral primary osteoarthritis, left knee: Secondary | ICD-10-CM | POA: Diagnosis not present

## 2016-11-07 DIAGNOSIS — M25562 Pain in left knee: Secondary | ICD-10-CM | POA: Diagnosis not present

## 2016-11-07 DIAGNOSIS — G5722 Lesion of femoral nerve, left lower limb: Secondary | ICD-10-CM | POA: Diagnosis not present

## 2016-11-07 DIAGNOSIS — Z888 Allergy status to other drugs, medicaments and biological substances status: Secondary | ICD-10-CM | POA: Diagnosis not present

## 2016-11-07 DIAGNOSIS — M797 Fibromyalgia: Secondary | ICD-10-CM | POA: Diagnosis not present

## 2016-11-23 DIAGNOSIS — G5782 Other specified mononeuropathies of left lower limb: Secondary | ICD-10-CM | POA: Diagnosis not present

## 2016-11-23 DIAGNOSIS — M47896 Other spondylosis, lumbar region: Secondary | ICD-10-CM | POA: Diagnosis not present

## 2016-11-23 DIAGNOSIS — M1712 Unilateral primary osteoarthritis, left knee: Secondary | ICD-10-CM | POA: Diagnosis not present

## 2016-11-23 DIAGNOSIS — M47892 Other spondylosis, cervical region: Secondary | ICD-10-CM | POA: Diagnosis not present

## 2016-11-23 DIAGNOSIS — Z79899 Other long term (current) drug therapy: Secondary | ICD-10-CM | POA: Diagnosis not present

## 2016-11-23 DIAGNOSIS — Z882 Allergy status to sulfonamides status: Secondary | ICD-10-CM | POA: Diagnosis not present

## 2016-11-23 DIAGNOSIS — M542 Cervicalgia: Secondary | ICD-10-CM | POA: Diagnosis not present

## 2016-11-23 DIAGNOSIS — Z888 Allergy status to other drugs, medicaments and biological substances status: Secondary | ICD-10-CM | POA: Diagnosis not present

## 2016-11-23 DIAGNOSIS — M170 Bilateral primary osteoarthritis of knee: Secondary | ICD-10-CM | POA: Diagnosis not present

## 2016-11-23 DIAGNOSIS — M549 Dorsalgia, unspecified: Secondary | ICD-10-CM | POA: Diagnosis not present

## 2016-11-23 DIAGNOSIS — M25531 Pain in right wrist: Secondary | ICD-10-CM | POA: Diagnosis not present

## 2016-11-23 DIAGNOSIS — Z7984 Long term (current) use of oral hypoglycemic drugs: Secondary | ICD-10-CM | POA: Diagnosis not present

## 2016-11-23 DIAGNOSIS — G5603 Carpal tunnel syndrome, bilateral upper limbs: Secondary | ICD-10-CM | POA: Diagnosis not present

## 2016-11-23 DIAGNOSIS — M25532 Pain in left wrist: Secondary | ICD-10-CM | POA: Diagnosis not present

## 2016-11-23 DIAGNOSIS — M797 Fibromyalgia: Secondary | ICD-10-CM | POA: Diagnosis not present

## 2016-11-23 DIAGNOSIS — G8929 Other chronic pain: Secondary | ICD-10-CM | POA: Diagnosis not present

## 2016-11-23 DIAGNOSIS — M2550 Pain in unspecified joint: Secondary | ICD-10-CM | POA: Diagnosis not present

## 2016-12-04 DIAGNOSIS — M797 Fibromyalgia: Secondary | ICD-10-CM | POA: Diagnosis not present

## 2016-12-04 DIAGNOSIS — N390 Urinary tract infection, site not specified: Secondary | ICD-10-CM | POA: Diagnosis not present

## 2016-12-04 DIAGNOSIS — Z794 Long term (current) use of insulin: Secondary | ICD-10-CM | POA: Diagnosis not present

## 2016-12-04 DIAGNOSIS — R1110 Vomiting, unspecified: Secondary | ICD-10-CM | POA: Diagnosis not present

## 2016-12-04 DIAGNOSIS — E1165 Type 2 diabetes mellitus with hyperglycemia: Secondary | ICD-10-CM | POA: Diagnosis not present

## 2016-12-04 DIAGNOSIS — I10 Essential (primary) hypertension: Secondary | ICD-10-CM | POA: Diagnosis not present

## 2016-12-04 DIAGNOSIS — N3000 Acute cystitis without hematuria: Secondary | ICD-10-CM | POA: Diagnosis not present

## 2016-12-04 DIAGNOSIS — E7800 Pure hypercholesterolemia, unspecified: Secondary | ICD-10-CM | POA: Diagnosis not present

## 2016-12-04 DIAGNOSIS — R112 Nausea with vomiting, unspecified: Secondary | ICD-10-CM | POA: Diagnosis not present

## 2016-12-04 DIAGNOSIS — Z79899 Other long term (current) drug therapy: Secondary | ICD-10-CM | POA: Diagnosis not present

## 2016-12-06 DIAGNOSIS — K3184 Gastroparesis: Secondary | ICD-10-CM | POA: Diagnosis not present

## 2016-12-06 DIAGNOSIS — E1143 Type 2 diabetes mellitus with diabetic autonomic (poly)neuropathy: Secondary | ICD-10-CM | POA: Diagnosis not present

## 2016-12-06 DIAGNOSIS — Z6833 Body mass index (BMI) 33.0-33.9, adult: Secondary | ICD-10-CM | POA: Diagnosis not present

## 2016-12-11 DIAGNOSIS — G894 Chronic pain syndrome: Secondary | ICD-10-CM | POA: Insufficient documentation

## 2016-12-11 DIAGNOSIS — M170 Bilateral primary osteoarthritis of knee: Secondary | ICD-10-CM | POA: Insufficient documentation

## 2016-12-11 DIAGNOSIS — M47812 Spondylosis without myelopathy or radiculopathy, cervical region: Secondary | ICD-10-CM | POA: Diagnosis not present

## 2016-12-11 DIAGNOSIS — M47816 Spondylosis without myelopathy or radiculopathy, lumbar region: Secondary | ICD-10-CM | POA: Diagnosis not present

## 2016-12-11 DIAGNOSIS — M797 Fibromyalgia: Secondary | ICD-10-CM | POA: Insufficient documentation

## 2016-12-11 DIAGNOSIS — R300 Dysuria: Secondary | ICD-10-CM | POA: Diagnosis not present

## 2016-12-11 DIAGNOSIS — Z79891 Long term (current) use of opiate analgesic: Secondary | ICD-10-CM | POA: Diagnosis not present

## 2016-12-12 DIAGNOSIS — M4716 Other spondylosis with myelopathy, lumbar region: Secondary | ICD-10-CM | POA: Diagnosis not present

## 2016-12-13 DIAGNOSIS — I10 Essential (primary) hypertension: Secondary | ICD-10-CM | POA: Diagnosis not present

## 2016-12-13 DIAGNOSIS — E1143 Type 2 diabetes mellitus with diabetic autonomic (poly)neuropathy: Secondary | ICD-10-CM | POA: Diagnosis not present

## 2016-12-13 DIAGNOSIS — N3000 Acute cystitis without hematuria: Secondary | ICD-10-CM | POA: Diagnosis not present

## 2016-12-13 DIAGNOSIS — R9431 Abnormal electrocardiogram [ECG] [EKG]: Secondary | ICD-10-CM | POA: Diagnosis not present

## 2016-12-13 DIAGNOSIS — K3184 Gastroparesis: Secondary | ICD-10-CM | POA: Diagnosis not present

## 2016-12-13 DIAGNOSIS — Z794 Long term (current) use of insulin: Secondary | ICD-10-CM | POA: Diagnosis not present

## 2016-12-13 DIAGNOSIS — M797 Fibromyalgia: Secondary | ICD-10-CM | POA: Diagnosis not present

## 2016-12-13 DIAGNOSIS — E7800 Pure hypercholesterolemia, unspecified: Secondary | ICD-10-CM | POA: Diagnosis not present

## 2016-12-13 DIAGNOSIS — Z8249 Family history of ischemic heart disease and other diseases of the circulatory system: Secondary | ICD-10-CM | POA: Diagnosis not present

## 2016-12-13 DIAGNOSIS — Z79899 Other long term (current) drug therapy: Secondary | ICD-10-CM | POA: Diagnosis not present

## 2016-12-22 ENCOUNTER — Telehealth: Payer: Self-pay | Admitting: *Deleted

## 2016-12-22 MED ORDER — LOSARTAN POTASSIUM 50 MG PO TABS: 100.0000 mg | ORAL_TABLET | Freq: Every day | ORAL | Status: DC

## 2016-12-22 NOTE — Telephone Encounter (Signed)
See BP log scanned into EPIC.  Per Dr. Bronson Ing - increase Losartan to 100mg  daily.  She will use the 50mg  tablet that she has at home for now as she has follow up already scheduled for 12/29/2016 with Dr. Bronson Ing.  Patient verbalized understanding.

## 2016-12-27 DIAGNOSIS — E119 Type 2 diabetes mellitus without complications: Secondary | ICD-10-CM | POA: Diagnosis not present

## 2016-12-27 DIAGNOSIS — M549 Dorsalgia, unspecified: Secondary | ICD-10-CM | POA: Diagnosis not present

## 2016-12-27 DIAGNOSIS — G8929 Other chronic pain: Secondary | ICD-10-CM | POA: Diagnosis not present

## 2016-12-27 DIAGNOSIS — N390 Urinary tract infection, site not specified: Secondary | ICD-10-CM | POA: Diagnosis not present

## 2016-12-28 ENCOUNTER — Encounter: Payer: Self-pay | Admitting: *Deleted

## 2016-12-29 ENCOUNTER — Encounter: Payer: Self-pay | Admitting: Cardiovascular Disease

## 2016-12-29 ENCOUNTER — Ambulatory Visit (INDEPENDENT_AMBULATORY_CARE_PROVIDER_SITE_OTHER): Payer: Medicare Other | Admitting: Cardiovascular Disease

## 2016-12-29 ENCOUNTER — Encounter (INDEPENDENT_AMBULATORY_CARE_PROVIDER_SITE_OTHER): Payer: Self-pay

## 2016-12-29 VITALS — BP 140/100 | HR 83 | Ht 62.0 in | Wt 187.2 lb

## 2016-12-29 DIAGNOSIS — I10 Essential (primary) hypertension: Secondary | ICD-10-CM

## 2016-12-29 DIAGNOSIS — R55 Syncope and collapse: Secondary | ICD-10-CM

## 2016-12-29 DIAGNOSIS — E785 Hyperlipidemia, unspecified: Secondary | ICD-10-CM | POA: Diagnosis not present

## 2016-12-29 DIAGNOSIS — Z9289 Personal history of other medical treatment: Secondary | ICD-10-CM | POA: Diagnosis not present

## 2016-12-29 MED ORDER — AMLODIPINE BESYLATE 2.5 MG PO TABS: 2.5000 mg | ORAL_TABLET | ORAL | 3 refills | Status: DC

## 2016-12-29 NOTE — Patient Instructions (Addendum)
Medication Instructions:   Your physician has recommended you make the following change in your medication:   Start amlodipine 2.5 mg by mouth daily in the mornings.  Continue all other medications the same.  Labwork:  NONE  Testing/Procedures:  NONE  Follow-Up:  Your physician recommends that you schedule a follow-up appointment in: 1 year. You will receive a reminder letter in the mail in about 10 months reminding you to call and schedule your appointment. If you don't receive this letter, please contact our office.  Any Other Special Instructions Will Be Listed Below (If Applicable).  If you need a refill on your cardiac medications before your next appointment, please call your pharmacy.

## 2016-12-29 NOTE — Progress Notes (Signed)
SUBJECTIVE: The patient presents for follow-up of syncope and hypertension. She was apparently hospitalized for a UTI at [facility]. She said she was hospitalized twice in last 3 weeks. I have requested documentation from this hospital.  She has chronic nausea and vomiting. She was given IV fluids. She was prescribed an antibiotic and medication for nausea.  She has taken her blood pressure at home throughout the day and most readings average 145/95-100.  She has had no further episodes of syncope.   Review of Systems: As per "subjective", otherwise negative.  Allergies  Allergen Reactions  . Ativan [Lorazepam]     Lowered blood pressure  . Ethosuximide Other (See Comments)    Does not know   . Iodine 131 Other (See Comments)    Blacked out   . Phenytoin Sodium Extended Other (See Comments)    Killed white blood cell at age of 23   . Sulfa Antibiotics Other (See Comments)    Bones     Current Outpatient Prescriptions  Medication Sig Dispense Refill  . aspirin-acetaminophen-caffeine (EXCEDRIN MIGRAINE) 250-250-65 MG per tablet Take 1 tablet by mouth every 6 (six) hours as needed. headache     . baclofen (LIORESAL) 10 MG tablet Take 1 tablet by mouth 3 (three) times daily.    Marland Kitchen HYDROcodone-acetaminophen (NORCO) 10-325 MG tablet Take 1 tablet by mouth every 6 (six) hours as needed.    Marland Kitchen ibuprofen (ADVIL,MOTRIN) 600 MG tablet Take 1 tablet by mouth every 8 (eight) hours as needed.    . insulin glargine (LANTUS) 100 UNIT/ML injection Inject into the skin. Sliding scale    . insulin lispro (HUMALOG) 100 UNIT/ML injection Inject into the skin. Sliding scale    . loratadine (CLARITIN) 10 MG tablet Take 10 mg by mouth daily as needed. For allergies    . losartan (COZAAR) 50 MG tablet Take 2 tablets (100 mg total) by mouth daily.    . metoCLOPramide (REGLAN) 10 MG tablet Take 5 tablets by mouth at bedtime.    . metoprolol succinate (TOPROL-XL) 25 MG 24 hr tablet Take 0.5  tablets (12.5 mg total) by mouth daily. Take with or immediately following a meal. 30 tablet 0  . metoprolol succinate (TOPROL-XL) 25 MG 24 hr tablet TAKE ONE-HALF (1/2) TABLET DAILY. TAKE WITH OR IMMEDIATELY FOLLOWING A MEAL 30 tablet 3  . mirtazapine (REMERON) 15 MG tablet Take 1 tablet by mouth daily.    . ondansetron (ZOFRAN) 4 MG tablet Take 4 mg by mouth every 8 (eight) hours as needed for nausea or vomiting.    . pantoprazole (PROTONIX) 40 MG tablet Take 1 tablet by mouth 2 (two) times daily.     . pramipexole (MIRAPEX) 1 MG tablet Take 1 tablet by mouth 2 (two) times daily.    . promethazine (PHENERGAN) 25 MG tablet Take 1 tablet by mouth 3 (three) times daily as needed.     No current facility-administered medications for this visit.     Past Medical History:  Diagnosis Date  . Cervical spine disease   . DM (diabetes mellitus) ([place])   . Dysphagia   . Fibromyalgia   . GERD (gastroesophageal reflux disease)   . HTN (hypertension)   . Hypercholesteremia   . PONV (postoperative nausea and vomiting)   . Seasonal allergies   . Seizures ([place])    last one > 20 years ago    Past Surgical History:  Procedure Laterality Date  . CARPAL TUNNEL RELEASE  RIGHT  .  CERVICAL SPINE SURGERY  APR 2012  . COLONOSCOPY  2008 [place]    FLEISCHMAN NL  . ESOPHAGOGASTRODUODENOSCOPY  01/24/2011   ZSW:FUXNATFTD 2o TO CERVICAL PLATES IN PT'S NECK AND MILD ESO MOTILITY DISORDER/Mild gastritis  . ESOPHAGOGASTRODUODENOSCOPY  03/03/11   DUK:GURK gastritis   . FLEXIBLE SIGMOIDOSCOPY  01/24/2011   SLF: Internal Hemorrhoids  . SAVORY DILATION  01/24/2011   Procedure: SAVORY DILATION;  Surgeon: Dorothyann Peng, MD;  Location[facility];  Service: Endoscopy;  Laterality: N/A;  . SHOULDER SURGERY  RIGHT  . TUBAL LIGATION      Social History   Social History  . Marital status: Widowed    Spouse name: N/A  . Number of children: N/A  . Years of education: N/A   Occupational History  . Not on file.    Social History Main Topics  . Smoking status: Former Smoker    Packs/day: 0.50    Years: 2.00    Types: Cigarettes    Start date: 10/12/1966    Quit date: 10/11/1968  . Smokeless tobacco: Never Used  . Alcohol use 0.0 oz/week     Comment: occasionally  . Drug use: No  . Sexual activity: Not on file   Other Topics Concern  . Not on file   Social History Narrative   MARRIED, 2 KIDS, YOUNGEST 9 YO.   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 Neck: No JVD, no thyromegaly. Lungs: Clear to auscultation bilaterally with normal respiratory effort. CV: Nondisplaced PMI.  Regular rate and rhythm, normal S1/S2, no S3/S4, no murmur. No pretibial or periankle edema.  No carotid bruit.   Abdomen: Soft, nontender, no distention.  Neurologic: Alert and oriented.  Psych: Normal affect. Skin: Normal. Musculoskeletal: No gross deformities.    ECG: Most recent ECG reviewed.   Labs: Lab Results  Component Value Date/Time   ALT 25 07/21/2010 11:03 AM   TSH 3.994 12/28/2010 04:37 PM   HGB 14.5 12/28/2010 04:37 PM     Lipids: No results found for: LDLCALC, LDLDIRECT, CHOL, TRIG, HDL     ASSESSMENT AND PLAN:  1. Syncope: No recurrences since discontinuation of diuretic. Prior episodes possibly due to autonomic neuropathy given diabetes and hypotension, which appeared to precipitate episodes. I previously told her that opioid use for back pain may cause drops in blood pressure and she should lie down after taking these medications to avoid near syncope if BP drops.  2. Essential HTN: Elevated. Currently on  losartan 100 mg daily and Toprol-XL 12.5 mg daily. I will start amlodipine, this time at a lower dose of 2.5 mg every morning to be taken with Toprol-XL. She takes losartan every evening. Fluctuating BP likely due to autonomic neuropathy.Previously encouraged exercising which will help to regulate vascular tone. I previously asked her to keep legs elevated to raise BP when she notices it is dropping, and to push fluid intake. I also recommended compression stockings.  3. Hyperlipidemia: Statin intolerant. No longer on Zetia.  Disposition: Follow up 1 year.   Kate Sable, M.D., F.A.C.C.

## 2017-01-08 DIAGNOSIS — M47812 Spondylosis without myelopathy or radiculopathy, cervical region: Secondary | ICD-10-CM | POA: Diagnosis not present

## 2017-01-08 DIAGNOSIS — G894 Chronic pain syndrome: Secondary | ICD-10-CM | POA: Diagnosis not present

## 2017-01-08 DIAGNOSIS — M797 Fibromyalgia: Secondary | ICD-10-CM | POA: Diagnosis not present

## 2017-01-08 DIAGNOSIS — M170 Bilateral primary osteoarthritis of knee: Secondary | ICD-10-CM | POA: Diagnosis not present

## 2017-01-08 DIAGNOSIS — M5136 Other intervertebral disc degeneration, lumbar region: Secondary | ICD-10-CM | POA: Diagnosis not present

## 2017-01-08 DIAGNOSIS — Z79891 Long term (current) use of opiate analgesic: Secondary | ICD-10-CM | POA: Diagnosis not present

## 2017-01-08 DIAGNOSIS — M47816 Spondylosis without myelopathy or radiculopathy, lumbar region: Secondary | ICD-10-CM | POA: Diagnosis not present

## 2017-01-17 ENCOUNTER — Ambulatory Visit: Payer: Medicare Other | Admitting: Gastroenterology

## 2017-01-18 ENCOUNTER — Encounter: Payer: Self-pay | Admitting: Gastroenterology

## 2017-01-18 DIAGNOSIS — Z23 Encounter for immunization: Secondary | ICD-10-CM | POA: Diagnosis not present

## 2017-02-07 DIAGNOSIS — M47816 Spondylosis without myelopathy or radiculopathy, lumbar region: Secondary | ICD-10-CM | POA: Diagnosis not present

## 2017-02-07 DIAGNOSIS — M5136 Other intervertebral disc degeneration, lumbar region: Secondary | ICD-10-CM | POA: Diagnosis not present

## 2017-02-12 DIAGNOSIS — K3184 Gastroparesis: Secondary | ICD-10-CM | POA: Diagnosis not present

## 2017-02-20 DIAGNOSIS — E1143 Type 2 diabetes mellitus with diabetic autonomic (poly)neuropathy: Secondary | ICD-10-CM | POA: Diagnosis not present

## 2017-02-20 DIAGNOSIS — E559 Vitamin D deficiency, unspecified: Secondary | ICD-10-CM | POA: Diagnosis not present

## 2017-02-20 DIAGNOSIS — R5382 Chronic fatigue, unspecified: Secondary | ICD-10-CM | POA: Diagnosis not present

## 2017-02-20 DIAGNOSIS — E7800 Pure hypercholesterolemia, unspecified: Secondary | ICD-10-CM | POA: Diagnosis not present

## 2017-02-20 DIAGNOSIS — E039 Hypothyroidism, unspecified: Secondary | ICD-10-CM | POA: Diagnosis not present

## 2017-02-20 DIAGNOSIS — K210 Gastro-esophageal reflux disease with esophagitis: Secondary | ICD-10-CM | POA: Diagnosis not present

## 2017-02-20 DIAGNOSIS — E538 Deficiency of other specified B group vitamins: Secondary | ICD-10-CM | POA: Diagnosis not present

## 2017-02-20 DIAGNOSIS — I10 Essential (primary) hypertension: Secondary | ICD-10-CM | POA: Diagnosis not present

## 2017-02-20 DIAGNOSIS — E1165 Type 2 diabetes mellitus with hyperglycemia: Secondary | ICD-10-CM | POA: Diagnosis not present

## 2017-02-22 DIAGNOSIS — E1165 Type 2 diabetes mellitus with hyperglycemia: Secondary | ICD-10-CM | POA: Diagnosis not present

## 2017-02-22 DIAGNOSIS — Z0001 Encounter for general adult medical examination with abnormal findings: Secondary | ICD-10-CM | POA: Diagnosis not present

## 2017-02-22 DIAGNOSIS — Z6833 Body mass index (BMI) 33.0-33.9, adult: Secondary | ICD-10-CM | POA: Diagnosis not present

## 2017-02-22 DIAGNOSIS — E039 Hypothyroidism, unspecified: Secondary | ICD-10-CM | POA: Diagnosis not present

## 2017-03-01 ENCOUNTER — Ambulatory Visit: Payer: Medicare Other | Admitting: Gastroenterology

## 2017-03-08 ENCOUNTER — Ambulatory Visit: Payer: Medicare Other | Admitting: Gastroenterology

## 2017-03-14 DIAGNOSIS — Z79891 Long term (current) use of opiate analgesic: Secondary | ICD-10-CM | POA: Diagnosis not present

## 2017-03-14 DIAGNOSIS — M5136 Other intervertebral disc degeneration, lumbar region: Secondary | ICD-10-CM | POA: Insufficient documentation

## 2017-03-14 DIAGNOSIS — M47812 Spondylosis without myelopathy or radiculopathy, cervical region: Secondary | ICD-10-CM | POA: Diagnosis not present

## 2017-03-14 DIAGNOSIS — M170 Bilateral primary osteoarthritis of knee: Secondary | ICD-10-CM | POA: Diagnosis not present

## 2017-03-14 DIAGNOSIS — M47816 Spondylosis without myelopathy or radiculopathy, lumbar region: Secondary | ICD-10-CM | POA: Diagnosis not present

## 2017-03-14 DIAGNOSIS — G894 Chronic pain syndrome: Secondary | ICD-10-CM | POA: Diagnosis not present

## 2017-03-14 DIAGNOSIS — M797 Fibromyalgia: Secondary | ICD-10-CM | POA: Diagnosis not present

## 2017-03-29 DIAGNOSIS — M5136 Other intervertebral disc degeneration, lumbar region: Secondary | ICD-10-CM | POA: Diagnosis not present

## 2017-04-11 DIAGNOSIS — Z1211 Encounter for screening for malignant neoplasm of colon: Secondary | ICD-10-CM | POA: Diagnosis not present

## 2017-04-11 DIAGNOSIS — R194 Change in bowel habit: Secondary | ICD-10-CM | POA: Diagnosis not present

## 2017-04-11 DIAGNOSIS — K3184 Gastroparesis: Secondary | ICD-10-CM | POA: Diagnosis not present

## 2017-04-11 DIAGNOSIS — R1010 Upper abdominal pain, unspecified: Secondary | ICD-10-CM | POA: Diagnosis not present

## 2017-04-11 DIAGNOSIS — R110 Nausea: Secondary | ICD-10-CM | POA: Diagnosis not present

## 2017-04-11 DIAGNOSIS — E1143 Type 2 diabetes mellitus with diabetic autonomic (poly)neuropathy: Secondary | ICD-10-CM | POA: Diagnosis not present

## 2017-04-11 DIAGNOSIS — R1310 Dysphagia, unspecified: Secondary | ICD-10-CM | POA: Diagnosis not present

## 2017-04-30 DIAGNOSIS — M5136 Other intervertebral disc degeneration, lumbar region: Secondary | ICD-10-CM | POA: Diagnosis not present

## 2017-04-30 DIAGNOSIS — G894 Chronic pain syndrome: Secondary | ICD-10-CM | POA: Diagnosis not present

## 2017-04-30 DIAGNOSIS — M47816 Spondylosis without myelopathy or radiculopathy, lumbar region: Secondary | ICD-10-CM | POA: Diagnosis not present

## 2017-05-08 DIAGNOSIS — B379 Candidiasis, unspecified: Secondary | ICD-10-CM | POA: Diagnosis not present

## 2017-05-08 DIAGNOSIS — Z6834 Body mass index (BMI) 34.0-34.9, adult: Secondary | ICD-10-CM | POA: Diagnosis not present

## 2017-05-29 DIAGNOSIS — Z888 Allergy status to other drugs, medicaments and biological substances status: Secondary | ICD-10-CM | POA: Diagnosis not present

## 2017-05-29 DIAGNOSIS — E119 Type 2 diabetes mellitus without complications: Secondary | ICD-10-CM | POA: Diagnosis not present

## 2017-05-29 DIAGNOSIS — I10 Essential (primary) hypertension: Secondary | ICD-10-CM | POA: Diagnosis not present

## 2017-05-29 DIAGNOSIS — K219 Gastro-esophageal reflux disease without esophagitis: Secondary | ICD-10-CM | POA: Diagnosis not present

## 2017-05-29 DIAGNOSIS — Z794 Long term (current) use of insulin: Secondary | ICD-10-CM | POA: Diagnosis not present

## 2017-05-29 DIAGNOSIS — Z882 Allergy status to sulfonamides status: Secondary | ICD-10-CM | POA: Diagnosis not present

## 2017-05-29 DIAGNOSIS — M47816 Spondylosis without myelopathy or radiculopathy, lumbar region: Secondary | ICD-10-CM | POA: Diagnosis not present

## 2017-06-21 DIAGNOSIS — R0683 Snoring: Secondary | ICD-10-CM | POA: Diagnosis not present

## 2017-06-21 DIAGNOSIS — G4719 Other hypersomnia: Secondary | ICD-10-CM | POA: Diagnosis not present

## 2017-07-05 DIAGNOSIS — M5136 Other intervertebral disc degeneration, lumbar region: Secondary | ICD-10-CM | POA: Diagnosis not present

## 2017-07-05 DIAGNOSIS — G894 Chronic pain syndrome: Secondary | ICD-10-CM | POA: Diagnosis not present

## 2017-07-05 DIAGNOSIS — M47816 Spondylosis without myelopathy or radiculopathy, lumbar region: Secondary | ICD-10-CM | POA: Diagnosis not present

## 2017-07-05 DIAGNOSIS — M7918 Myalgia, other site: Secondary | ICD-10-CM | POA: Diagnosis not present

## 2017-07-05 DIAGNOSIS — M797 Fibromyalgia: Secondary | ICD-10-CM | POA: Diagnosis not present

## 2017-07-11 DIAGNOSIS — M5136 Other intervertebral disc degeneration, lumbar region: Secondary | ICD-10-CM | POA: Diagnosis not present

## 2017-07-11 DIAGNOSIS — M545 Low back pain: Secondary | ICD-10-CM | POA: Diagnosis not present

## 2017-07-11 DIAGNOSIS — R748 Abnormal levels of other serum enzymes: Secondary | ICD-10-CM | POA: Diagnosis not present

## 2017-07-11 DIAGNOSIS — G2581 Restless legs syndrome: Secondary | ICD-10-CM | POA: Diagnosis not present

## 2017-07-11 DIAGNOSIS — K210 Gastro-esophageal reflux disease with esophagitis: Secondary | ICD-10-CM | POA: Diagnosis not present

## 2017-07-11 DIAGNOSIS — E7800 Pure hypercholesterolemia, unspecified: Secondary | ICD-10-CM | POA: Diagnosis not present

## 2017-07-11 DIAGNOSIS — D519 Vitamin B12 deficiency anemia, unspecified: Secondary | ICD-10-CM | POA: Diagnosis not present

## 2017-07-11 DIAGNOSIS — I10 Essential (primary) hypertension: Secondary | ICD-10-CM | POA: Diagnosis not present

## 2017-07-11 DIAGNOSIS — M47816 Spondylosis without myelopathy or radiculopathy, lumbar region: Secondary | ICD-10-CM | POA: Diagnosis not present

## 2017-07-11 DIAGNOSIS — E1143 Type 2 diabetes mellitus with diabetic autonomic (poly)neuropathy: Secondary | ICD-10-CM | POA: Diagnosis not present

## 2017-07-11 DIAGNOSIS — E039 Hypothyroidism, unspecified: Secondary | ICD-10-CM | POA: Diagnosis not present

## 2017-07-11 DIAGNOSIS — R5382 Chronic fatigue, unspecified: Secondary | ICD-10-CM | POA: Diagnosis not present

## 2017-07-12 DIAGNOSIS — Z6835 Body mass index (BMI) 35.0-35.9, adult: Secondary | ICD-10-CM | POA: Diagnosis not present

## 2017-07-12 DIAGNOSIS — E1165 Type 2 diabetes mellitus with hyperglycemia: Secondary | ICD-10-CM | POA: Diagnosis not present

## 2017-07-12 DIAGNOSIS — M797 Fibromyalgia: Secondary | ICD-10-CM | POA: Diagnosis not present

## 2017-07-12 DIAGNOSIS — M130 Polyarthritis, unspecified: Secondary | ICD-10-CM | POA: Diagnosis not present

## 2017-07-12 DIAGNOSIS — I10 Essential (primary) hypertension: Secondary | ICD-10-CM | POA: Diagnosis not present

## 2017-07-12 DIAGNOSIS — E1143 Type 2 diabetes mellitus with diabetic autonomic (poly)neuropathy: Secondary | ICD-10-CM | POA: Diagnosis not present

## 2017-07-13 DIAGNOSIS — M47816 Spondylosis without myelopathy or radiculopathy, lumbar region: Secondary | ICD-10-CM | POA: Diagnosis not present

## 2017-07-13 DIAGNOSIS — M545 Low back pain: Secondary | ICD-10-CM | POA: Diagnosis not present

## 2017-07-13 DIAGNOSIS — M6281 Muscle weakness (generalized): Secondary | ICD-10-CM | POA: Diagnosis not present

## 2017-07-13 DIAGNOSIS — M5136 Other intervertebral disc degeneration, lumbar region: Secondary | ICD-10-CM | POA: Diagnosis not present

## 2017-07-16 DIAGNOSIS — M7918 Myalgia, other site: Secondary | ICD-10-CM | POA: Diagnosis not present

## 2017-07-25 ENCOUNTER — Other Ambulatory Visit: Payer: Self-pay | Admitting: Cardiovascular Disease

## 2017-07-25 MED ORDER — LOSARTAN POTASSIUM 50 MG PO TABS: 100.0000 mg | ORAL_TABLET | Freq: Every day | ORAL | 1 refills | Status: DC

## 2017-07-25 MED ORDER — LOSARTAN POTASSIUM 50 MG PO TABS: 100.0000 mg | ORAL_TABLET | Freq: Every day | ORAL | 0 refills | Status: DC

## 2017-07-25 NOTE — Telephone Encounter (Signed)
Medication sent to pharmacy  

## 2017-07-25 NOTE — Telephone Encounter (Signed)
°*  STAT* If patient is at the pharmacy, call can be transferred to refill team.   1. Which medications need to be refilled? losartan (COZAAR) 50 MG tablet - stated she was instructed to take 100 mg   2. Which pharmacy/location (including street and city if local pharmacy) is medication to be sent to?  Needs one month sent into [facility] Then rest sent to [facility]  She is currently out of this medication  3. Do they need a 30 day or 90 day supply? [place]

## 2017-08-01 DIAGNOSIS — R0683 Snoring: Secondary | ICD-10-CM | POA: Diagnosis not present

## 2017-08-01 DIAGNOSIS — G4719 Other hypersomnia: Secondary | ICD-10-CM | POA: Diagnosis not present

## 2017-08-26 ENCOUNTER — Other Ambulatory Visit: Payer: Self-pay | Admitting: Adult Health

## 2017-09-24 DIAGNOSIS — R05 Cough: Secondary | ICD-10-CM | POA: Diagnosis not present

## 2017-09-24 DIAGNOSIS — Z6834 Body mass index (BMI) 34.0-34.9, adult: Secondary | ICD-10-CM | POA: Diagnosis not present

## 2017-09-24 DIAGNOSIS — J0101 Acute recurrent maxillary sinusitis: Secondary | ICD-10-CM | POA: Diagnosis not present

## 2017-10-08 DIAGNOSIS — M7918 Myalgia, other site: Secondary | ICD-10-CM | POA: Diagnosis not present

## 2017-10-08 DIAGNOSIS — G894 Chronic pain syndrome: Secondary | ICD-10-CM | POA: Diagnosis not present

## 2017-10-08 DIAGNOSIS — M1712 Unilateral primary osteoarthritis, left knee: Secondary | ICD-10-CM | POA: Diagnosis not present

## 2017-10-08 DIAGNOSIS — M797 Fibromyalgia: Secondary | ICD-10-CM | POA: Diagnosis not present

## 2017-10-08 DIAGNOSIS — M5136 Other intervertebral disc degeneration, lumbar region: Secondary | ICD-10-CM | POA: Diagnosis not present

## 2017-10-08 DIAGNOSIS — M47816 Spondylosis without myelopathy or radiculopathy, lumbar region: Secondary | ICD-10-CM | POA: Diagnosis not present

## 2017-10-12 DIAGNOSIS — E1165 Type 2 diabetes mellitus with hyperglycemia: Secondary | ICD-10-CM | POA: Diagnosis not present

## 2017-10-12 DIAGNOSIS — Z6834 Body mass index (BMI) 34.0-34.9, adult: Secondary | ICD-10-CM | POA: Diagnosis not present

## 2017-10-12 DIAGNOSIS — E559 Vitamin D deficiency, unspecified: Secondary | ICD-10-CM | POA: Diagnosis not present

## 2017-10-12 DIAGNOSIS — M130 Polyarthritis, unspecified: Secondary | ICD-10-CM | POA: Diagnosis not present

## 2017-10-12 DIAGNOSIS — Z1389 Encounter for screening for other disorder: Secondary | ICD-10-CM | POA: Diagnosis not present

## 2017-10-12 DIAGNOSIS — E039 Hypothyroidism, unspecified: Secondary | ICD-10-CM | POA: Diagnosis not present

## 2017-10-12 DIAGNOSIS — Z1331 Encounter for screening for depression: Secondary | ICD-10-CM | POA: Diagnosis not present

## 2017-10-12 DIAGNOSIS — I10 Essential (primary) hypertension: Secondary | ICD-10-CM | POA: Diagnosis not present

## 2017-10-12 DIAGNOSIS — R3915 Urgency of urination: Secondary | ICD-10-CM | POA: Diagnosis not present

## 2017-10-12 DIAGNOSIS — R05 Cough: Secondary | ICD-10-CM | POA: Diagnosis not present

## 2017-10-19 DIAGNOSIS — M4716 Other spondylosis with myelopathy, lumbar region: Secondary | ICD-10-CM | POA: Insufficient documentation

## 2017-10-19 DIAGNOSIS — M533 Sacrococcygeal disorders, not elsewhere classified: Secondary | ICD-10-CM | POA: Diagnosis not present

## 2017-10-23 DIAGNOSIS — M23204 Derangement of unspecified medial meniscus due to old tear or injury, left knee: Secondary | ICD-10-CM | POA: Diagnosis not present

## 2017-10-23 DIAGNOSIS — M1712 Unilateral primary osteoarthritis, left knee: Secondary | ICD-10-CM | POA: Diagnosis not present

## 2017-11-07 DIAGNOSIS — E669 Obesity, unspecified: Secondary | ICD-10-CM | POA: Diagnosis not present

## 2017-11-07 DIAGNOSIS — M797 Fibromyalgia: Secondary | ICD-10-CM | POA: Diagnosis not present

## 2017-11-07 DIAGNOSIS — M25662 Stiffness of left knee, not elsewhere classified: Secondary | ICD-10-CM | POA: Diagnosis not present

## 2017-11-07 DIAGNOSIS — G8929 Other chronic pain: Secondary | ICD-10-CM | POA: Diagnosis not present

## 2017-11-07 DIAGNOSIS — E118 Type 2 diabetes mellitus with unspecified complications: Secondary | ICD-10-CM | POA: Diagnosis not present

## 2017-11-07 DIAGNOSIS — M792 Neuralgia and neuritis, unspecified: Secondary | ICD-10-CM | POA: Diagnosis not present

## 2017-11-07 DIAGNOSIS — M25562 Pain in left knee: Secondary | ICD-10-CM | POA: Diagnosis not present

## 2017-11-07 DIAGNOSIS — M6281 Muscle weakness (generalized): Secondary | ICD-10-CM | POA: Diagnosis not present

## 2017-11-07 DIAGNOSIS — M545 Low back pain: Secondary | ICD-10-CM | POA: Diagnosis not present

## 2017-11-07 DIAGNOSIS — I10 Essential (primary) hypertension: Secondary | ICD-10-CM | POA: Diagnosis not present

## 2017-11-08 DIAGNOSIS — G4719 Other hypersomnia: Secondary | ICD-10-CM | POA: Diagnosis not present

## 2017-11-16 DIAGNOSIS — Z6835 Body mass index (BMI) 35.0-35.9, adult: Secondary | ICD-10-CM | POA: Diagnosis not present

## 2017-11-16 DIAGNOSIS — N3001 Acute cystitis with hematuria: Secondary | ICD-10-CM | POA: Diagnosis not present

## 2017-11-27 DIAGNOSIS — M545 Low back pain: Secondary | ICD-10-CM | POA: Diagnosis not present

## 2017-11-27 DIAGNOSIS — M25562 Pain in left knee: Secondary | ICD-10-CM | POA: Diagnosis not present

## 2017-11-27 DIAGNOSIS — M797 Fibromyalgia: Secondary | ICD-10-CM | POA: Diagnosis not present

## 2017-11-27 DIAGNOSIS — M792 Neuralgia and neuritis, unspecified: Secondary | ICD-10-CM | POA: Diagnosis not present

## 2017-11-27 DIAGNOSIS — M25662 Stiffness of left knee, not elsewhere classified: Secondary | ICD-10-CM | POA: Diagnosis not present

## 2017-11-27 DIAGNOSIS — M6281 Muscle weakness (generalized): Secondary | ICD-10-CM | POA: Diagnosis not present

## 2017-11-27 DIAGNOSIS — G8929 Other chronic pain: Secondary | ICD-10-CM | POA: Diagnosis not present

## 2017-11-27 DIAGNOSIS — E118 Type 2 diabetes mellitus with unspecified complications: Secondary | ICD-10-CM | POA: Diagnosis not present

## 2017-11-27 DIAGNOSIS — E669 Obesity, unspecified: Secondary | ICD-10-CM | POA: Diagnosis not present

## 2017-11-27 DIAGNOSIS — I10 Essential (primary) hypertension: Secondary | ICD-10-CM | POA: Diagnosis not present

## 2017-11-29 DIAGNOSIS — M47816 Spondylosis without myelopathy or radiculopathy, lumbar region: Secondary | ICD-10-CM | POA: Diagnosis not present

## 2017-11-29 DIAGNOSIS — G894 Chronic pain syndrome: Secondary | ICD-10-CM | POA: Diagnosis not present

## 2017-11-29 DIAGNOSIS — M533 Sacrococcygeal disorders, not elsewhere classified: Secondary | ICD-10-CM | POA: Diagnosis not present

## 2017-11-30 DIAGNOSIS — R300 Dysuria: Secondary | ICD-10-CM | POA: Diagnosis not present

## 2017-12-25 DIAGNOSIS — M4716 Other spondylosis with myelopathy, lumbar region: Secondary | ICD-10-CM | POA: Diagnosis not present

## 2017-12-25 DIAGNOSIS — M533 Sacrococcygeal disorders, not elsewhere classified: Secondary | ICD-10-CM | POA: Diagnosis not present

## 2018-01-24 DIAGNOSIS — M533 Sacrococcygeal disorders, not elsewhere classified: Secondary | ICD-10-CM | POA: Diagnosis not present

## 2018-01-24 DIAGNOSIS — G894 Chronic pain syndrome: Secondary | ICD-10-CM | POA: Diagnosis not present

## 2018-01-24 DIAGNOSIS — M25531 Pain in right wrist: Secondary | ICD-10-CM | POA: Diagnosis not present

## 2018-01-31 ENCOUNTER — Other Ambulatory Visit: Payer: Self-pay | Admitting: Cardiovascular Disease

## 2018-02-05 ENCOUNTER — Other Ambulatory Visit: Payer: Self-pay | Admitting: Cardiovascular Disease

## 2018-02-27 DIAGNOSIS — J069 Acute upper respiratory infection, unspecified: Secondary | ICD-10-CM | POA: Diagnosis not present

## 2018-02-27 DIAGNOSIS — J329 Chronic sinusitis, unspecified: Secondary | ICD-10-CM | POA: Diagnosis not present

## 2018-02-27 DIAGNOSIS — Z6833 Body mass index (BMI) 33.0-33.9, adult: Secondary | ICD-10-CM | POA: Diagnosis not present

## 2018-03-18 ENCOUNTER — Encounter

## 2018-03-18 ENCOUNTER — Ambulatory Visit (INDEPENDENT_AMBULATORY_CARE_PROVIDER_SITE_OTHER): Payer: Medicare Other | Admitting: Cardiovascular Disease

## 2018-03-18 ENCOUNTER — Encounter: Payer: Self-pay | Admitting: Cardiovascular Disease

## 2018-03-18 VITALS — BP 156/84 | HR 89 | Ht 62.0 in | Wt 194.0 lb

## 2018-03-18 DIAGNOSIS — I10 Essential (primary) hypertension: Secondary | ICD-10-CM

## 2018-03-18 DIAGNOSIS — R55 Syncope and collapse: Secondary | ICD-10-CM

## 2018-03-18 NOTE — Progress Notes (Signed)
SUBJECTIVE: The patient presents for follow-up of syncope and hypertension.  ECG performed in the office today which I ordered and personally interpreted demonstrates normal sinus rhythm with RSR prime pattern in V1.  The patient denies any symptoms of chest pain, palpitations, shortness of breath, lightheadedness, dizziness, leg swelling, orthopnea, PND, and syncope.  About a month ago she had a drop in her systolic blood pressure to the 70s.  She followed my instructions and layed down for about 40 minutes and blood pressure normalized and she felt better.  Systolic blood pressures at home generally run in the 130 range.  She plans to begin exercising at a local gym in the near future.    Review of Systems: As per "subjective", otherwise negative.  Allergies  Allergen Reactions  . Ativan [Lorazepam]     Lowered blood pressure  . Ethosuximide Other (See Comments)    Does not know   . Iodine 131 Other (See Comments)    Blacked out   . Phenytoin Sodium Extended Other (See Comments)    Killed white blood cell at age of 69   . Sulfa Antibiotics Other (See Comments)    Bones     Current Outpatient Medications  Medication Sig Dispense Refill  . amLODipine (NORVASC) 2.5 MG tablet TAKE 1 TABLET BY MOUTH EVERY MORNING. 90 tablet 0  . aspirin-acetaminophen-caffeine (EXCEDRIN MIGRAINE) 063-016-01 MG per tablet Take 1 tablet by mouth every 6 (six) hours as needed. headache     . insulin regular (NOVOLIN R,HUMULIN R) 100 units/mL injection Inject into the skin 2 (two) times daily. Sliding scale    . loratadine (CLARITIN) 10 MG tablet Take 10 mg by mouth daily as needed. For allergies    . losartan (COZAAR) 50 MG tablet Take 2 tablets (100 mg total) by mouth daily. 60 tablet 0  . metoprolol succinate (TOPROL-XL) 25 MG 24 hr tablet TAKE ONE-HALF (1/2) TABLET DAILY. TAKE WITH OR IMMEDIATELY FOLLOWING A MEAL. SCHEDULE APPOINTMENT FOR FUTURE REFILLS 30 tablet 6  . pantoprazole  (PROTONIX) 40 MG tablet Take 1 tablet by mouth 2 (two) times daily.     . pramipexole (MIRAPEX) 1 MG tablet Take 1 tablet by mouth 2 (two) times daily.     No current facility-administered medications for this visit.     Past Medical History:  Diagnosis Date  . Cervical spine disease   . DM (diabetes mellitus) ([place])   . Dysphagia   . Fibromyalgia   . GERD (gastroesophageal reflux disease)   . HTN (hypertension)   . Hypercholesteremia   . PONV (postoperative nausea and vomiting)   . Seasonal allergies   . Seizures ([place])    last one > 20 years ago    Past Surgical History:  Procedure Laterality Date  . CARPAL TUNNEL RELEASE  RIGHT  . CERVICAL SPINE SURGERY  APR 2012  . COLONOSCOPY  2008 [place]    FLEISCHMAN NL  . ESOPHAGOGASTRODUODENOSCOPY  01/24/2011   UXN:[id number] 2o TO CERVICAL PLATES IN PT'S NECK AND MILD ESO MOTILITY DISORDER/Mild gastritis  . ESOPHAGOGASTRODUODENOSCOPY  03/03/11   URK:YHCW gastritis   . FLEXIBLE SIGMOIDOSCOPY  01/24/2011   SLF: Internal Hemorrhoids  . SAVORY DILATION  01/24/2011   Procedure: SAVORY DILATION;  Surgeon: Dorothyann Peng, MD;  [facility];  Service: Endoscopy;  Laterality: N/A;  . SHOULDER SURGERY  RIGHT  . TUBAL LIGATION      Social History   Socioeconomic History  . Marital status: Widowed  Spouse name: Not on file  . Number of children: Not on file  . Years of education: Not on file  . Highest education level: Not on file  Occupational History  . Not on file  Social Needs  . Financial resource strain: Not on file  . Food insecurity:    Worry: Not on file    Inability: Not on file  . Transportation needs:    Medical: Not on file    Non-medical: Not on file  Tobacco Use  . Smoking status: Former Smoker    Packs/day: 0.50    Years: 2.00    Pack years: 1.00    Types: Cigarettes    Start date: 10/12/1966    Last attempt to quit: 10/11/1968    Years since quitting: 49.4  . Smokeless tobacco: Never Used  Substance  and Sexual Activity  . Alcohol use: Yes    Alcohol/week: 0.0 standard drinks    Comment: occasionally  . Drug use: No  . Sexual activity: Not on file  Lifestyle  . Physical activity:    Days per week: Not on file    Minutes per session: Not on file  . Stress: Not on file  Relationships  . Social connections:    Talks on phone: Not on file    Gets together: Not on file    Attends religious service: Not on file    Active member of club or organization: Not on file    Attends meetings of clubs or organizations: Not on file    Relationship status: Not on file  . Intimate partner violence:    Fear of current or ex partner: Not on file    Emotionally abused: Not on file    Physically abused: Not on file    Forced sexual activity: Not on file  Other Topics Concern  . Not on file  Social History Narrative   MARRIED, 2 KIDS, YOUNGEST 47 YO.   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 Neck: No JVD, no thyromegaly. Lungs: Clear to auscultation bilaterally with normal respiratory effort. CV: Regular rate and rhythm, normal S1/S2, no S3/S4, no murmur. No pretibial or periankle edema.  No carotid bruit.   Abdomen: Soft, nontender, no distention.  Neurologic: Alert and oriented.  Psych: Normal affect. Skin: Normal. Musculoskeletal: No gross deformities.    ECG: Reviewed above under Subjective   Labs: Lab Results  Component Value Date/Time   ALT 25 07/21/2010 11:03 AM   TSH 3.994 12/28/2010 04:37 PM   HGB 14.5 12/28/2010 04:37 PM     Lipids: No results found for: LDLCALC, LDLDIRECT, CHOL, TRIG, HDL      ASSESSMENT AND PLAN:  1. Syncope: No recurrences since discontinuation of diuretic. Prior episodes possibly due to autonomic neuropathy given diabetes and hypotension, which appeared to precipitate episodes. I previously told her that opioid use for back pain may cause drops in blood pressure and she should lie down after taking these medications to avoid near syncope if BP drops.  2. Essential HTN: Blood pressure is elevated today but systolic blood pressures generally run in the 130 range at home.  Currently on losartan 100 mg, Toprol-XL 12.5 mg, and amlodipine 2.5 mg. Fluctuating BP likely due to autonomic neuropathy.Previously encouraged exercising which will help to regulate vascular tone.  She plans to do so in the near future. I previously asked her to keep legs elevated to raise BP when she notices it is dropping, and to push fluid intake. I also recommended compression stockings.   Disposition: Follow up as needed   Kate Sable, M.D., F.A.C.C.

## 2018-03-18 NOTE — Patient Instructions (Signed)
Medication Instructions:  Continue all current medications.  Labwork: none  Testing/Procedures: none  Follow-Up: As needed.    Any Other Special Instructions Will Be Listed Below (If Applicable).  If you need a refill on your cardiac medications before your next appointment, please call your pharmacy.  

## 2018-03-27 DIAGNOSIS — R300 Dysuria: Secondary | ICD-10-CM | POA: Diagnosis not present

## 2018-04-11 DIAGNOSIS — M79672 Pain in left foot: Secondary | ICD-10-CM | POA: Diagnosis not present

## 2018-04-11 DIAGNOSIS — E1140 Type 2 diabetes mellitus with diabetic neuropathy, unspecified: Secondary | ICD-10-CM | POA: Diagnosis not present

## 2018-04-11 DIAGNOSIS — M779 Enthesopathy, unspecified: Secondary | ICD-10-CM | POA: Diagnosis not present

## 2018-04-11 DIAGNOSIS — M79671 Pain in right foot: Secondary | ICD-10-CM | POA: Diagnosis not present

## 2018-05-02 ENCOUNTER — Other Ambulatory Visit: Payer: Self-pay | Admitting: Cardiovascular Disease

## 2018-05-06 DIAGNOSIS — E039 Hypothyroidism, unspecified: Secondary | ICD-10-CM | POA: Diagnosis not present

## 2018-05-06 DIAGNOSIS — R748 Abnormal levels of other serum enzymes: Secondary | ICD-10-CM | POA: Diagnosis not present

## 2018-05-06 DIAGNOSIS — G2581 Restless legs syndrome: Secondary | ICD-10-CM | POA: Diagnosis not present

## 2018-05-06 DIAGNOSIS — I10 Essential (primary) hypertension: Secondary | ICD-10-CM | POA: Diagnosis not present

## 2018-05-06 DIAGNOSIS — R5382 Chronic fatigue, unspecified: Secondary | ICD-10-CM | POA: Diagnosis not present

## 2018-05-06 DIAGNOSIS — E1165 Type 2 diabetes mellitus with hyperglycemia: Secondary | ICD-10-CM | POA: Diagnosis not present

## 2018-05-06 DIAGNOSIS — E7800 Pure hypercholesterolemia, unspecified: Secondary | ICD-10-CM | POA: Diagnosis not present

## 2018-05-06 DIAGNOSIS — K210 Gastro-esophageal reflux disease with esophagitis: Secondary | ICD-10-CM | POA: Diagnosis not present

## 2018-05-06 DIAGNOSIS — E1143 Type 2 diabetes mellitus with diabetic autonomic (poly)neuropathy: Secondary | ICD-10-CM | POA: Diagnosis not present

## 2018-05-08 DIAGNOSIS — Z0001 Encounter for general adult medical examination with abnormal findings: Secondary | ICD-10-CM | POA: Diagnosis not present

## 2018-05-08 DIAGNOSIS — I10 Essential (primary) hypertension: Secondary | ICD-10-CM | POA: Diagnosis not present

## 2018-05-08 DIAGNOSIS — Z6834 Body mass index (BMI) 34.0-34.9, adult: Secondary | ICD-10-CM | POA: Diagnosis not present

## 2018-05-09 DIAGNOSIS — M25579 Pain in unspecified ankle and joints of unspecified foot: Secondary | ICD-10-CM | POA: Diagnosis not present

## 2018-05-09 DIAGNOSIS — M79672 Pain in left foot: Secondary | ICD-10-CM | POA: Diagnosis not present

## 2018-05-09 DIAGNOSIS — S93622A Sprain of tarsometatarsal ligament of left foot, initial encounter: Secondary | ICD-10-CM | POA: Diagnosis not present

## 2018-06-03 DIAGNOSIS — M1712 Unilateral primary osteoarthritis, left knee: Secondary | ICD-10-CM | POA: Diagnosis not present

## 2018-06-03 DIAGNOSIS — G894 Chronic pain syndrome: Secondary | ICD-10-CM | POA: Diagnosis not present

## 2018-06-17 DIAGNOSIS — M79672 Pain in left foot: Secondary | ICD-10-CM | POA: Diagnosis not present

## 2018-06-17 DIAGNOSIS — M25579 Pain in unspecified ankle and joints of unspecified foot: Secondary | ICD-10-CM | POA: Diagnosis not present

## 2018-06-17 DIAGNOSIS — S93622D Sprain of tarsometatarsal ligament of left foot, subsequent encounter: Secondary | ICD-10-CM | POA: Diagnosis not present

## 2018-06-23 ENCOUNTER — Other Ambulatory Visit: Payer: Self-pay | Admitting: Cardiovascular Disease

## 2018-07-09 ENCOUNTER — Telehealth: Payer: Self-pay | Admitting: Cardiovascular Disease

## 2018-07-09 ENCOUNTER — Encounter: Payer: Self-pay | Admitting: Cardiovascular Disease

## 2018-07-09 ENCOUNTER — Telehealth (INDEPENDENT_AMBULATORY_CARE_PROVIDER_SITE_OTHER): Payer: Medicare Other | Admitting: Cardiovascular Disease

## 2018-07-09 VITALS — BP 133/82 | HR 70 | Ht 62.0 in | Wt 190.0 lb

## 2018-07-09 DIAGNOSIS — R0789 Other chest pain: Secondary | ICD-10-CM

## 2018-07-09 DIAGNOSIS — I10 Essential (primary) hypertension: Secondary | ICD-10-CM | POA: Diagnosis not present

## 2018-07-09 DIAGNOSIS — R55 Syncope and collapse: Secondary | ICD-10-CM | POA: Diagnosis not present

## 2018-07-09 DIAGNOSIS — R002 Palpitations: Secondary | ICD-10-CM

## 2018-07-09 NOTE — Patient Instructions (Signed)
Your physician recommends that you schedule a follow-up appointment in: AS NEEDED WITH DR KONESWARAN  Your physician recommends that you continue on your current medications as directed. Please refer to the Current Medication list given to you today.  Thank you for choosing [facility]!!    

## 2018-07-09 NOTE — Telephone Encounter (Signed)
° ° °  Virtual Visit Pre-Appointment Phone Call  Steps For Call:  1. Confirm consent - "In the setting of the current Covid19 crisis, you are scheduled for a (phone or video) visit with your provider on (date) at (time).  Just as we do with many in-office visits, in order for you to participate in this visit, we must obtain consent.  If you'd like, I can send this to your mychart (if signed up) or email for you to review.  Otherwise, I can obtain your verbal consent now.  All virtual visits are billed to your insurance company just like a normal visit would be.  By agreeing to a virtual visit, we'd like you to understand that the technology does not allow for your provider to perform an examination, and thus may limit your provider's ability to fully assess your condition. If your provider identifies any concerns that need to be evaluated in person, we will make arrangements to do so.  Finally, though the technology is pretty good, we cannot assure that it will always work on either your or our end, and in the setting of a video visit, we may have to convert it to a phone-only visit.  In either situation, we cannot ensure that we have a secure connection.  Are you willing to proceed?" STAFF: Did the patient verbally acknowledge consent to telehealth visit? Document YES/NO here: Yes  2. Confirm the BEST phone number to call the day of the visit by including in appointment notes  3. Give patient instructions for MyChart download to smartphone OR [email or web address] as below if video visit (depending on what platform provider is using)  4. Confirm that appointment type is correct in Epic appointment notes (VIDEO vs PHONE)  5. Advise patient to be prepared with their blood pressure, heart rate, weight, any heart rhythm information, their current medicines, and a piece of paper and pen handy for any instructions they may receive the day of their visit  6. Inform patient they will receive a phone call 15 minutes  prior to their appointment time (may be from unknown caller ID) so they should be prepared to answer    TELEPHONE CALL NOTE  Kelly Wiley has been deemed a candidate for a follow-up tele-health visit to limit community exposure during the Covid-19 pandemic. I spoke with the patient via phone to ensure availability of phone/video source, confirm preferred email & phone number, and discuss instructions and expectations.  I reminded Kelly Wiley to be prepared with any vital sign and/or heart rhythm information that could potentially be obtained via home monitoring, at the time of her visit. I reminded Kelly Wiley to expect a phone call prior to her visit.  Terry L Goins 07/09/2018 10:05 AM

## 2018-07-09 NOTE — Progress Notes (Signed)
Virtual Visit via Telephone Note   This visit type was conducted due to national recommendations for restrictions regarding the COVID-19 Pandemic (e.g. social distancing) in an effort to limit this patient's exposure and mitigate transmission in our community.  Due to her co-morbid illnesses, this patient is at least at moderate risk for complications without adequate follow up.  This format is felt to be most appropriate for this patient at this time.  The patient did not have access to video technology/had technical difficulties with video requiring transitioning to audio format only (telephone).  All issues noted in this document were discussed and addressed.  No physical exam could be performed with this format.  Please refer to the patient's chart for her  consent to telehealth for [facility].   Evaluation Performed:  Follow-up visit  Date:  07/09/2018   ID:  Kelly Wiley, DOB Aug 12, 1955, MRN [id number]  Patient Location: Home Provider Location: Office  PCP:  Rory Percy, MD  Cardiologist:  No primary care provider on file.  Electrophysiologist:  None   Chief Complaint:  htn  History of Present Illness:    Kelly Wiley is a 63 y.o. female with a history of syncope and hypertension.  When I last saw her on 03/18/2018 I instructed her to follow-up as needed.  Yesterday she felt worn out and tired.  She felt that way this morning as well but now feels energized.  She told me her blood pressure yesterday morning was 185/120.  She took her medications and it came down to 154/100.  Last night after taking 100 mg of losartan dropped to 80/77.  She said this is the first time this is happened in a long time.  Heart rate is usually in the 80 bpm range.  She did have some chest heaviness with a pounding sensation yesterday.  She had palpitations for 3 to 4 days.  Today all of the symptoms have subsided altogether.  We talked about her stress levels and she said "this may be playing a  role ".  She denies fevers, chills, and diarrhea.  She denies headaches.  There is been no change in her diet and she said she is eating less salt and trying to eat more healthy overall. This morning her blood pressure was 123/82 and then she rechecked it and it was 133/85.  The patient does not have symptoms concerning for COVID-19 infection (fever, chills, cough, or new shortness of breath).    Past Medical History:  Diagnosis Date   Cervical spine disease    DM (diabetes mellitus) ([place])    Dysphagia    Fibromyalgia    GERD (gastroesophageal reflux disease)    HTN (hypertension)    Hypercholesteremia    PONV (postoperative nausea and vomiting)    Seasonal allergies    Seizures ([place])    last one > 20 years ago   Past Surgical History:  Procedure Laterality Date   CARPAL TUNNEL RELEASE  RIGHT   CERVICAL SPINE SURGERY  APR 2012   COLONOSCOPY  2008 [place] [facility]   ESOPHAGOGASTRODUODENOSCOPY  01/24/2011   YTK:PTWSFKCLE 2o TO CERVICAL PLATES IN PT'S NECK AND MILD ESO MOTILITY DISORDER/Mild gastritis   ESOPHAGOGASTRODUODENOSCOPY  03/03/11   XNT:ZGYF gastritis    FLEXIBLE SIGMOIDOSCOPY  01/24/2011   SLF: Internal Hemorrhoids   SAVORY DILATION  01/24/2011   Procedure: SAVORY DILATION;  Surgeon: Dorothyann Peng, MD;  [facility];  Service: Endoscopy;  Laterality: N/A;  SHOULDER SURGERY  RIGHT   TUBAL LIGATION       Current Meds  Medication Sig   amLODipine (NORVASC) 2.5 MG tablet TAKE 1 TABLET BY MOUTH EVERY MORNING.   aspirin-acetaminophen-caffeine (EXCEDRIN MIGRAINE) 250-250-65 MG per tablet Take 1 tablet by mouth every 6 (six) hours as needed. headache    insulin regular (NOVOLIN R,HUMULIN R) 100 units/mL injection Inject into the skin 2 (two) times daily. Sliding scale   loratadine (CLARITIN) 10 MG tablet Take 10 mg by mouth daily as needed. For allergies   losartan (COZAAR) 50 MG tablet TAKE 2 TABLETS DAILY. (DOSE INCREASED 12/22/2016)     metoprolol succinate (TOPROL-XL) 25 MG 24 hr tablet TAKE ONE-HALF (1/2) TABLET DAILY. TAKE WITH OR IMMEDIATELY FOLLOWING A MEAL. SCHEDULE APPOINTMENT FOR FUTURE REFILLS   pantoprazole (PROTONIX) 40 MG tablet Take 1 tablet by mouth 2 (two) times daily.    pramipexole (MIRAPEX) 1 MG tablet Take 1 tablet by mouth 2 (two) times daily.     Allergies:   Ativan [lorazepam]; Ethosuximide; Iodine 131; Phenytoin sodium extended; and Sulfa antibiotics   Social History   Tobacco Use   Smoking status: Former Smoker    Packs/day: 0.50    Years: 2.00    Pack years: 1.00    Types: Cigarettes    Start date: 10/12/1966    Last attempt to quit: 10/11/1968    Years since quitting: 49.7   Smokeless tobacco: Never Used  Substance Use Topics   Alcohol use: Yes    Alcohol/week: 0.0 standard drinks    Comment: occasionally   Drug use: No     Family Hx: The patient's family history includes Congenital heart disease in her son; Diabetes in her son; Heart disease in her mother; Lung cancer in her mother. There is no history of Colon cancer or Colon polyps.  ROS:   Please see the history of present illness.     All other systems reviewed and are negative.   Prior CV studies:   The following studies were reviewed today:  NA  Labs/Other Tests and Data Reviewed:    EKG:  No ECG reviewed.  Recent Labs: No results found for requested labs within last 8760 hours.   Recent Lipid Panel No results found for: CHOL, TRIG, HDL, CHOLHDL, LDLCALC, LDLDIRECT  Wt Readings from Last 3 Encounters:  07/09/18 190 lb (86.2 kg)  03/18/18 194 lb (88 kg)  12/29/16 187 lb 3.2 oz (84.9 kg)     Objective:    Vital Signs:  BP 133/82    Pulse 70    Ht 5\' 2"  (1.575 m)    Wt 190 lb (86.2 kg)    BMI 34.75 kg/m    VITAL SIGNS:  reviewed  ASSESSMENT & PLAN:    1.  Hypertension: She has had some blood pressure elevations over the past 24 hours but this has since normalized.  I asked her to keep an eye on it  and to inform her PCP of these values. Fluctuating BP likely due to autonomic neuropathy.Previously encouraged exercising which will help to regulate vascular tone.  Ipreviouslyasked her to keep legs elevated to raise BP when she notices it is dropping, and to push fluid intake. I also recommended compression stockings.  2.  Chest heaviness and palpitations: She had the symptoms yesterday morning when her blood pressure is elevated but these have since subsided.  I told her to keep a symptom diary and to see if there is a correlation with her blood  pressure.  I also told her to inform her PCP of her blood pressure as she said she has not seen him in quite some time.  3.  Syncope: No recurrences since discontinuation of diuretic. Prior episodes possibly due to autonomic neuropathy given diabetes and hypotension, which appeared to precipitate episodes. Ipreviouslytold her that opioid use for back pain may cause drops in blood pressure and she should lie down after taking these medications to avoid near syncope if BP drops.   COVID-19 Education: The signs and symptoms of COVID-19 were discussed with the patient and how to seek care for testing (follow up with PCP or arrange E-visit).  The importance of social distancing was discussed today.  Time:   Today, I have spent 15 minutes with the patient with telehealth technology discussing the above problems.     Medication Adjustments/Labs and Tests Ordered: Current medicines are reviewed at length with the patient today.  Concerns regarding medicines are outlined above.   Tests Ordered: No orders of the defined types were placed in this encounter.   Medication Changes: No orders of the defined types were placed in this encounter.   Disposition:  Follow up prn  Signed, Kate Sable, MD  07/09/2018 2:18 PM    [place]

## 2018-07-10 DIAGNOSIS — Z6833 Body mass index (BMI) 33.0-33.9, adult: Secondary | ICD-10-CM | POA: Diagnosis not present

## 2018-07-10 DIAGNOSIS — E1165 Type 2 diabetes mellitus with hyperglycemia: Secondary | ICD-10-CM | POA: Diagnosis not present

## 2018-07-10 DIAGNOSIS — E039 Hypothyroidism, unspecified: Secondary | ICD-10-CM | POA: Diagnosis not present

## 2018-07-10 DIAGNOSIS — I10 Essential (primary) hypertension: Secondary | ICD-10-CM | POA: Diagnosis not present

## 2018-07-10 DIAGNOSIS — E1143 Type 2 diabetes mellitus with diabetic autonomic (poly)neuropathy: Secondary | ICD-10-CM | POA: Diagnosis not present

## 2018-07-10 DIAGNOSIS — R5383 Other fatigue: Secondary | ICD-10-CM | POA: Diagnosis not present

## 2018-07-26 DIAGNOSIS — E1143 Type 2 diabetes mellitus with diabetic autonomic (poly)neuropathy: Secondary | ICD-10-CM | POA: Diagnosis not present

## 2018-07-26 DIAGNOSIS — E1165 Type 2 diabetes mellitus with hyperglycemia: Secondary | ICD-10-CM | POA: Diagnosis not present

## 2018-07-26 DIAGNOSIS — Z6834 Body mass index (BMI) 34.0-34.9, adult: Secondary | ICD-10-CM | POA: Diagnosis not present

## 2018-07-26 DIAGNOSIS — R5383 Other fatigue: Secondary | ICD-10-CM | POA: Diagnosis not present

## 2018-07-26 DIAGNOSIS — Z23 Encounter for immunization: Secondary | ICD-10-CM | POA: Diagnosis not present

## 2018-07-26 DIAGNOSIS — E039 Hypothyroidism, unspecified: Secondary | ICD-10-CM | POA: Diagnosis not present

## 2018-07-26 DIAGNOSIS — I10 Essential (primary) hypertension: Secondary | ICD-10-CM | POA: Diagnosis not present

## 2018-07-30 DIAGNOSIS — M25579 Pain in unspecified ankle and joints of unspecified foot: Secondary | ICD-10-CM | POA: Diagnosis not present

## 2018-07-30 DIAGNOSIS — M79671 Pain in right foot: Secondary | ICD-10-CM | POA: Diagnosis not present

## 2018-07-30 DIAGNOSIS — E1140 Type 2 diabetes mellitus with diabetic neuropathy, unspecified: Secondary | ICD-10-CM | POA: Diagnosis not present

## 2018-07-30 DIAGNOSIS — M79672 Pain in left foot: Secondary | ICD-10-CM | POA: Diagnosis not present

## 2018-07-30 DIAGNOSIS — M779 Enthesopathy, unspecified: Secondary | ICD-10-CM | POA: Diagnosis not present

## 2018-10-09 DIAGNOSIS — I959 Hypotension, unspecified: Secondary | ICD-10-CM | POA: Diagnosis not present

## 2018-10-09 DIAGNOSIS — R5381 Other malaise: Secondary | ICD-10-CM | POA: Diagnosis not present

## 2018-10-09 DIAGNOSIS — Z79899 Other long term (current) drug therapy: Secondary | ICD-10-CM | POA: Diagnosis not present

## 2018-10-09 DIAGNOSIS — E785 Hyperlipidemia, unspecified: Secondary | ICD-10-CM | POA: Diagnosis not present

## 2018-10-09 DIAGNOSIS — N3090 Cystitis, unspecified without hematuria: Secondary | ICD-10-CM | POA: Diagnosis not present

## 2018-10-09 DIAGNOSIS — Z882 Allergy status to sulfonamides status: Secondary | ICD-10-CM | POA: Diagnosis not present

## 2018-10-09 DIAGNOSIS — I10 Essential (primary) hypertension: Secondary | ICD-10-CM | POA: Diagnosis not present

## 2018-10-09 DIAGNOSIS — R61 Generalized hyperhidrosis: Secondary | ICD-10-CM | POA: Diagnosis not present

## 2018-10-09 DIAGNOSIS — E119 Type 2 diabetes mellitus without complications: Secondary | ICD-10-CM | POA: Diagnosis not present

## 2018-10-09 DIAGNOSIS — R42 Dizziness and giddiness: Secondary | ICD-10-CM | POA: Diagnosis not present

## 2018-10-09 DIAGNOSIS — R0689 Other abnormalities of breathing: Secondary | ICD-10-CM | POA: Diagnosis not present

## 2018-10-18 DIAGNOSIS — R5383 Other fatigue: Secondary | ICD-10-CM | POA: Diagnosis not present

## 2018-10-18 DIAGNOSIS — E039 Hypothyroidism, unspecified: Secondary | ICD-10-CM | POA: Diagnosis not present

## 2018-10-18 DIAGNOSIS — E1143 Type 2 diabetes mellitus with diabetic autonomic (poly)neuropathy: Secondary | ICD-10-CM | POA: Diagnosis not present

## 2018-10-18 DIAGNOSIS — Z6834 Body mass index (BMI) 34.0-34.9, adult: Secondary | ICD-10-CM | POA: Diagnosis not present

## 2018-10-18 DIAGNOSIS — I10 Essential (primary) hypertension: Secondary | ICD-10-CM | POA: Diagnosis not present

## 2018-10-21 DIAGNOSIS — M19072 Primary osteoarthritis, left ankle and foot: Secondary | ICD-10-CM | POA: Diagnosis not present

## 2018-10-21 DIAGNOSIS — M19071 Primary osteoarthritis, right ankle and foot: Secondary | ICD-10-CM | POA: Diagnosis not present

## 2018-10-21 DIAGNOSIS — M170 Bilateral primary osteoarthritis of knee: Secondary | ICD-10-CM | POA: Diagnosis not present

## 2018-10-26 DIAGNOSIS — Z6834 Body mass index (BMI) 34.0-34.9, adult: Secondary | ICD-10-CM | POA: Diagnosis not present

## 2018-10-26 DIAGNOSIS — L0390 Cellulitis, unspecified: Secondary | ICD-10-CM | POA: Diagnosis not present

## 2018-10-26 DIAGNOSIS — S00469A Insect bite (nonvenomous) of unspecified ear, initial encounter: Secondary | ICD-10-CM | POA: Diagnosis not present

## 2018-11-04 DIAGNOSIS — E538 Deficiency of other specified B group vitamins: Secondary | ICD-10-CM | POA: Diagnosis not present

## 2018-11-04 DIAGNOSIS — I10 Essential (primary) hypertension: Secondary | ICD-10-CM | POA: Diagnosis not present

## 2018-11-04 DIAGNOSIS — R300 Dysuria: Secondary | ICD-10-CM | POA: Diagnosis not present

## 2018-11-04 DIAGNOSIS — E039 Hypothyroidism, unspecified: Secondary | ICD-10-CM | POA: Diagnosis not present

## 2018-11-04 DIAGNOSIS — R42 Dizziness and giddiness: Secondary | ICD-10-CM | POA: Diagnosis not present

## 2018-11-04 DIAGNOSIS — E559 Vitamin D deficiency, unspecified: Secondary | ICD-10-CM | POA: Diagnosis not present

## 2018-11-04 DIAGNOSIS — K210 Gastro-esophageal reflux disease with esophagitis: Secondary | ICD-10-CM | POA: Diagnosis not present

## 2018-11-04 DIAGNOSIS — R5383 Other fatigue: Secondary | ICD-10-CM | POA: Diagnosis not present

## 2018-11-04 DIAGNOSIS — E1165 Type 2 diabetes mellitus with hyperglycemia: Secondary | ICD-10-CM | POA: Diagnosis not present

## 2018-11-04 DIAGNOSIS — E782 Mixed hyperlipidemia: Secondary | ICD-10-CM | POA: Diagnosis not present

## 2018-11-07 DIAGNOSIS — E1143 Type 2 diabetes mellitus with diabetic autonomic (poly)neuropathy: Secondary | ICD-10-CM | POA: Diagnosis not present

## 2018-11-07 DIAGNOSIS — I10 Essential (primary) hypertension: Secondary | ICD-10-CM | POA: Diagnosis not present

## 2018-11-07 DIAGNOSIS — R5383 Other fatigue: Secondary | ICD-10-CM | POA: Diagnosis not present

## 2018-11-07 DIAGNOSIS — Z6833 Body mass index (BMI) 33.0-33.9, adult: Secondary | ICD-10-CM | POA: Diagnosis not present

## 2018-11-07 DIAGNOSIS — E039 Hypothyroidism, unspecified: Secondary | ICD-10-CM | POA: Diagnosis not present

## 2018-11-18 DIAGNOSIS — M170 Bilateral primary osteoarthritis of knee: Secondary | ICD-10-CM | POA: Diagnosis not present

## 2018-12-02 DIAGNOSIS — M170 Bilateral primary osteoarthritis of knee: Secondary | ICD-10-CM | POA: Diagnosis not present

## 2018-12-09 DIAGNOSIS — M170 Bilateral primary osteoarthritis of knee: Secondary | ICD-10-CM | POA: Diagnosis not present

## 2018-12-17 DIAGNOSIS — E039 Hypothyroidism, unspecified: Secondary | ICD-10-CM | POA: Diagnosis not present

## 2018-12-17 DIAGNOSIS — Z6833 Body mass index (BMI) 33.0-33.9, adult: Secondary | ICD-10-CM | POA: Diagnosis not present

## 2018-12-17 DIAGNOSIS — R5383 Other fatigue: Secondary | ICD-10-CM | POA: Diagnosis not present

## 2018-12-17 DIAGNOSIS — I10 Essential (primary) hypertension: Secondary | ICD-10-CM | POA: Diagnosis not present

## 2018-12-17 DIAGNOSIS — E1143 Type 2 diabetes mellitus with diabetic autonomic (poly)neuropathy: Secondary | ICD-10-CM | POA: Diagnosis not present

## 2018-12-27 DIAGNOSIS — Z23 Encounter for immunization: Secondary | ICD-10-CM | POA: Diagnosis not present

## 2019-01-02 DIAGNOSIS — Z6833 Body mass index (BMI) 33.0-33.9, adult: Secondary | ICD-10-CM | POA: Diagnosis not present

## 2019-01-02 DIAGNOSIS — N390 Urinary tract infection, site not specified: Secondary | ICD-10-CM | POA: Diagnosis not present

## 2019-01-02 DIAGNOSIS — R300 Dysuria: Secondary | ICD-10-CM | POA: Diagnosis not present

## 2019-01-29 DIAGNOSIS — R300 Dysuria: Secondary | ICD-10-CM | POA: Diagnosis not present

## 2019-01-29 DIAGNOSIS — N390 Urinary tract infection, site not specified: Secondary | ICD-10-CM | POA: Diagnosis not present

## 2019-01-29 DIAGNOSIS — Z6833 Body mass index (BMI) 33.0-33.9, adult: Secondary | ICD-10-CM | POA: Diagnosis not present

## 2019-02-24 DIAGNOSIS — M170 Bilateral primary osteoarthritis of knee: Secondary | ICD-10-CM | POA: Diagnosis not present

## 2019-04-01 DIAGNOSIS — I10 Essential (primary) hypertension: Secondary | ICD-10-CM | POA: Diagnosis not present

## 2019-04-01 DIAGNOSIS — W540XXA Bitten by dog, initial encounter: Secondary | ICD-10-CM | POA: Diagnosis not present

## 2019-04-01 DIAGNOSIS — Z882 Allergy status to sulfonamides status: Secondary | ICD-10-CM | POA: Diagnosis not present

## 2019-04-01 DIAGNOSIS — Z87891 Personal history of nicotine dependence: Secondary | ICD-10-CM | POA: Diagnosis not present

## 2019-04-01 DIAGNOSIS — Z888 Allergy status to other drugs, medicaments and biological substances status: Secondary | ICD-10-CM | POA: Diagnosis not present

## 2019-04-01 DIAGNOSIS — E7800 Pure hypercholesterolemia, unspecified: Secondary | ICD-10-CM | POA: Diagnosis not present

## 2019-04-01 DIAGNOSIS — Z23 Encounter for immunization: Secondary | ICD-10-CM | POA: Diagnosis not present

## 2019-04-01 DIAGNOSIS — Z8249 Family history of ischemic heart disease and other diseases of the circulatory system: Secondary | ICD-10-CM | POA: Diagnosis not present

## 2019-04-01 DIAGNOSIS — Z794 Long term (current) use of insulin: Secondary | ICD-10-CM | POA: Diagnosis not present

## 2019-04-01 DIAGNOSIS — Z79899 Other long term (current) drug therapy: Secondary | ICD-10-CM | POA: Diagnosis not present

## 2019-04-01 DIAGNOSIS — M797 Fibromyalgia: Secondary | ICD-10-CM | POA: Diagnosis not present

## 2019-04-01 DIAGNOSIS — S61411A Laceration without foreign body of right hand, initial encounter: Secondary | ICD-10-CM | POA: Diagnosis not present

## 2019-04-01 DIAGNOSIS — E119 Type 2 diabetes mellitus without complications: Secondary | ICD-10-CM | POA: Diagnosis not present

## 2019-04-01 DIAGNOSIS — S61451A Open bite of right hand, initial encounter: Secondary | ICD-10-CM | POA: Diagnosis not present

## 2019-04-21 DIAGNOSIS — S76011D Strain of muscle, fascia and tendon of right hip, subsequent encounter: Secondary | ICD-10-CM | POA: Diagnosis not present

## 2019-04-21 DIAGNOSIS — S83242A Other tear of medial meniscus, current injury, left knee, initial encounter: Secondary | ICD-10-CM | POA: Diagnosis not present

## 2019-04-21 DIAGNOSIS — M170 Bilateral primary osteoarthritis of knee: Secondary | ICD-10-CM | POA: Diagnosis not present

## 2019-04-21 DIAGNOSIS — M1611 Unilateral primary osteoarthritis, right hip: Secondary | ICD-10-CM | POA: Diagnosis not present

## 2019-05-02 DIAGNOSIS — E11319 Type 2 diabetes mellitus with unspecified diabetic retinopathy without macular edema: Secondary | ICD-10-CM | POA: Diagnosis not present

## 2019-05-09 DIAGNOSIS — Z79899 Other long term (current) drug therapy: Secondary | ICD-10-CM | POA: Diagnosis not present

## 2019-05-09 DIAGNOSIS — Z888 Allergy status to other drugs, medicaments and biological substances status: Secondary | ICD-10-CM | POA: Diagnosis not present

## 2019-05-09 DIAGNOSIS — Z8249 Family history of ischemic heart disease and other diseases of the circulatory system: Secondary | ICD-10-CM | POA: Diagnosis not present

## 2019-05-09 DIAGNOSIS — S61551A Open bite of right wrist, initial encounter: Secondary | ICD-10-CM | POA: Diagnosis not present

## 2019-05-09 DIAGNOSIS — E7800 Pure hypercholesterolemia, unspecified: Secondary | ICD-10-CM | POA: Diagnosis not present

## 2019-05-09 DIAGNOSIS — S61251A Open bite of left index finger without damage to nail, initial encounter: Secondary | ICD-10-CM | POA: Diagnosis not present

## 2019-05-09 DIAGNOSIS — E119 Type 2 diabetes mellitus without complications: Secondary | ICD-10-CM | POA: Diagnosis not present

## 2019-05-09 DIAGNOSIS — M797 Fibromyalgia: Secondary | ICD-10-CM | POA: Diagnosis not present

## 2019-05-09 DIAGNOSIS — W540XXA Bitten by dog, initial encounter: Secondary | ICD-10-CM | POA: Diagnosis not present

## 2019-05-09 DIAGNOSIS — I10 Essential (primary) hypertension: Secondary | ICD-10-CM | POA: Diagnosis not present

## 2019-05-09 DIAGNOSIS — Z794 Long term (current) use of insulin: Secondary | ICD-10-CM | POA: Diagnosis not present

## 2019-05-09 DIAGNOSIS — Z87891 Personal history of nicotine dependence: Secondary | ICD-10-CM | POA: Diagnosis not present

## 2019-05-09 DIAGNOSIS — Z882 Allergy status to sulfonamides status: Secondary | ICD-10-CM | POA: Diagnosis not present

## 2019-05-09 DIAGNOSIS — S51851A Open bite of right forearm, initial encounter: Secondary | ICD-10-CM | POA: Diagnosis not present

## 2019-05-12 DIAGNOSIS — Z6834 Body mass index (BMI) 34.0-34.9, adult: Secondary | ICD-10-CM | POA: Diagnosis not present

## 2019-05-12 DIAGNOSIS — S61551A Open bite of right wrist, initial encounter: Secondary | ICD-10-CM | POA: Diagnosis not present

## 2019-05-12 DIAGNOSIS — L03113 Cellulitis of right upper limb: Secondary | ICD-10-CM | POA: Diagnosis not present

## 2019-05-14 DIAGNOSIS — Z6834 Body mass index (BMI) 34.0-34.9, adult: Secondary | ICD-10-CM | POA: Diagnosis not present

## 2019-05-14 DIAGNOSIS — L03113 Cellulitis of right upper limb: Secondary | ICD-10-CM | POA: Diagnosis not present

## 2019-05-14 DIAGNOSIS — S52609A Unspecified fracture of lower end of unspecified ulna, initial encounter for closed fracture: Secondary | ICD-10-CM | POA: Diagnosis not present

## 2019-05-14 DIAGNOSIS — S61559A Open bite of unspecified wrist, initial encounter: Secondary | ICD-10-CM | POA: Diagnosis not present

## 2019-05-21 DIAGNOSIS — S61551A Open bite of right wrist, initial encounter: Secondary | ICD-10-CM | POA: Diagnosis not present

## 2019-05-21 DIAGNOSIS — L03113 Cellulitis of right upper limb: Secondary | ICD-10-CM | POA: Diagnosis not present

## 2019-05-21 DIAGNOSIS — Z6833 Body mass index (BMI) 33.0-33.9, adult: Secondary | ICD-10-CM | POA: Diagnosis not present

## 2019-05-21 DIAGNOSIS — Z4802 Encounter for removal of sutures: Secondary | ICD-10-CM | POA: Diagnosis not present

## 2019-05-22 DIAGNOSIS — M25531 Pain in right wrist: Secondary | ICD-10-CM | POA: Diagnosis not present

## 2019-05-25 ENCOUNTER — Other Ambulatory Visit: Payer: Self-pay | Admitting: Cardiovascular Disease

## 2019-06-03 DIAGNOSIS — S52601D Unspecified fracture of lower end of right ulna, subsequent encounter for closed fracture with routine healing: Secondary | ICD-10-CM | POA: Diagnosis not present

## 2019-06-05 DIAGNOSIS — I10 Essential (primary) hypertension: Secondary | ICD-10-CM | POA: Diagnosis not present

## 2019-06-05 DIAGNOSIS — Z6834 Body mass index (BMI) 34.0-34.9, adult: Secondary | ICD-10-CM | POA: Diagnosis not present

## 2019-06-05 DIAGNOSIS — E1143 Type 2 diabetes mellitus with diabetic autonomic (poly)neuropathy: Secondary | ICD-10-CM | POA: Diagnosis not present

## 2019-06-05 DIAGNOSIS — R5383 Other fatigue: Secondary | ICD-10-CM | POA: Diagnosis not present

## 2019-06-05 DIAGNOSIS — Z6833 Body mass index (BMI) 33.0-33.9, adult: Secondary | ICD-10-CM | POA: Diagnosis not present

## 2019-06-05 DIAGNOSIS — E039 Hypothyroidism, unspecified: Secondary | ICD-10-CM | POA: Diagnosis not present

## 2019-06-05 DIAGNOSIS — E559 Vitamin D deficiency, unspecified: Secondary | ICD-10-CM | POA: Diagnosis not present

## 2019-06-17 DIAGNOSIS — S52601B Unspecified fracture of lower end of right ulna, initial encounter for open fracture type I or II: Secondary | ICD-10-CM | POA: Diagnosis not present

## 2019-07-01 DIAGNOSIS — M1712 Unilateral primary osteoarthritis, left knee: Secondary | ICD-10-CM | POA: Diagnosis not present

## 2019-07-08 DIAGNOSIS — S52601D Unspecified fracture of lower end of right ulna, subsequent encounter for closed fracture with routine healing: Secondary | ICD-10-CM | POA: Diagnosis not present

## 2019-07-21 ENCOUNTER — Other Ambulatory Visit (HOSPITAL_COMMUNITY): Payer: Self-pay | Admitting: Sports Medicine

## 2019-07-21 DIAGNOSIS — M810 Age-related osteoporosis without current pathological fracture: Secondary | ICD-10-CM | POA: Diagnosis not present

## 2019-07-21 DIAGNOSIS — E559 Vitamin D deficiency, unspecified: Secondary | ICD-10-CM | POA: Diagnosis not present

## 2019-07-21 DIAGNOSIS — S52601D Unspecified fracture of lower end of right ulna, subsequent encounter for closed fracture with routine healing: Secondary | ICD-10-CM | POA: Diagnosis not present

## 2019-07-22 DIAGNOSIS — S52601D Unspecified fracture of lower end of right ulna, subsequent encounter for closed fracture with routine healing: Secondary | ICD-10-CM | POA: Diagnosis not present

## 2019-07-24 DIAGNOSIS — M170 Bilateral primary osteoarthritis of knee: Secondary | ICD-10-CM | POA: Diagnosis not present

## 2019-07-28 DIAGNOSIS — Z23 Encounter for immunization: Secondary | ICD-10-CM | POA: Diagnosis not present

## 2019-07-30 ENCOUNTER — Other Ambulatory Visit (HOSPITAL_COMMUNITY): Payer: Medicare Other

## 2019-08-07 ENCOUNTER — Ambulatory Visit (HOSPITAL_COMMUNITY)
Admission: RE | Admit: 2019-08-07 | Discharge: 2019-08-07 | Disposition: A | Discharge status: Home / Self Care | Payer: Medicare Other | Source: Ambulatory Visit | Attending: Sports Medicine | Admitting: Sports Medicine

## 2019-08-07 ENCOUNTER — Other Ambulatory Visit: Payer: Self-pay

## 2019-08-07 DIAGNOSIS — M810 Age-related osteoporosis without current pathological fracture: Secondary | ICD-10-CM | POA: Insufficient documentation

## 2019-08-25 DIAGNOSIS — Z23 Encounter for immunization: Secondary | ICD-10-CM | POA: Diagnosis not present

## 2019-11-08 DIAGNOSIS — I10 Essential (primary) hypertension: Secondary | ICD-10-CM | POA: Diagnosis not present

## 2019-11-08 DIAGNOSIS — E119 Type 2 diabetes mellitus without complications: Secondary | ICD-10-CM | POA: Diagnosis not present

## 2019-11-08 DIAGNOSIS — S81851A Open bite, right lower leg, initial encounter: Secondary | ICD-10-CM | POA: Diagnosis not present

## 2019-11-08 DIAGNOSIS — S62514B Nondisplaced fracture of proximal phalanx of right thumb, initial encounter for open fracture: Secondary | ICD-10-CM | POA: Diagnosis not present

## 2019-11-08 DIAGNOSIS — W540XXA Bitten by dog, initial encounter: Secondary | ICD-10-CM | POA: Diagnosis not present

## 2019-11-08 DIAGNOSIS — S62514A Nondisplaced fracture of proximal phalanx of right thumb, initial encounter for closed fracture: Secondary | ICD-10-CM | POA: Diagnosis not present

## 2019-11-08 DIAGNOSIS — Z87891 Personal history of nicotine dependence: Secondary | ICD-10-CM | POA: Diagnosis not present

## 2019-11-08 DIAGNOSIS — E7800 Pure hypercholesterolemia, unspecified: Secondary | ICD-10-CM | POA: Diagnosis not present

## 2019-11-08 DIAGNOSIS — S81811A Laceration without foreign body, right lower leg, initial encounter: Secondary | ICD-10-CM | POA: Diagnosis not present

## 2019-11-10 DIAGNOSIS — S62501D Fracture of unspecified phalanx of right thumb, subsequent encounter for fracture with routine healing: Secondary | ICD-10-CM | POA: Diagnosis not present

## 2019-11-10 DIAGNOSIS — S81811D Laceration without foreign body, right lower leg, subsequent encounter: Secondary | ICD-10-CM | POA: Diagnosis not present

## 2019-11-10 DIAGNOSIS — S61011D Laceration without foreign body of right thumb without damage to nail, subsequent encounter: Secondary | ICD-10-CM | POA: Diagnosis not present

## 2019-11-11 DIAGNOSIS — M79644 Pain in right finger(s): Secondary | ICD-10-CM | POA: Diagnosis not present

## 2019-11-17 DIAGNOSIS — M810 Age-related osteoporosis without current pathological fracture: Secondary | ICD-10-CM | POA: Diagnosis not present

## 2019-11-17 DIAGNOSIS — S81811D Laceration without foreign body, right lower leg, subsequent encounter: Secondary | ICD-10-CM | POA: Diagnosis not present

## 2019-11-18 DIAGNOSIS — M79644 Pain in right finger(s): Secondary | ICD-10-CM | POA: Diagnosis not present

## 2019-11-20 DIAGNOSIS — S62511A Displaced fracture of proximal phalanx of right thumb, initial encounter for closed fracture: Secondary | ICD-10-CM | POA: Diagnosis not present

## 2019-11-20 DIAGNOSIS — Y999 Unspecified external cause status: Secondary | ICD-10-CM | POA: Diagnosis not present

## 2019-11-20 DIAGNOSIS — X58XXXA Exposure to other specified factors, initial encounter: Secondary | ICD-10-CM | POA: Diagnosis not present

## 2019-11-20 DIAGNOSIS — G8918 Other acute postprocedural pain: Secondary | ICD-10-CM | POA: Diagnosis not present

## 2019-12-02 DIAGNOSIS — M79644 Pain in right finger(s): Secondary | ICD-10-CM | POA: Diagnosis not present

## 2019-12-02 DIAGNOSIS — Z4789 Encounter for other orthopedic aftercare: Secondary | ICD-10-CM | POA: Diagnosis not present

## 2019-12-15 DIAGNOSIS — R300 Dysuria: Secondary | ICD-10-CM | POA: Diagnosis not present

## 2019-12-15 DIAGNOSIS — N3001 Acute cystitis with hematuria: Secondary | ICD-10-CM | POA: Diagnosis not present

## 2019-12-15 DIAGNOSIS — Z6832 Body mass index (BMI) 32.0-32.9, adult: Secondary | ICD-10-CM | POA: Diagnosis not present

## 2019-12-17 DIAGNOSIS — M79644 Pain in right finger(s): Secondary | ICD-10-CM | POA: Diagnosis not present

## 2019-12-17 DIAGNOSIS — Z4789 Encounter for other orthopedic aftercare: Secondary | ICD-10-CM | POA: Diagnosis not present

## 2019-12-29 DIAGNOSIS — M810 Age-related osteoporosis without current pathological fracture: Secondary | ICD-10-CM | POA: Diagnosis not present

## 2020-01-06 DIAGNOSIS — Z4789 Encounter for other orthopedic aftercare: Secondary | ICD-10-CM | POA: Diagnosis not present

## 2020-01-06 DIAGNOSIS — M79644 Pain in right finger(s): Secondary | ICD-10-CM | POA: Diagnosis not present

## 2020-01-19 DIAGNOSIS — E1143 Type 2 diabetes mellitus with diabetic autonomic (poly)neuropathy: Secondary | ICD-10-CM | POA: Diagnosis not present

## 2020-01-19 DIAGNOSIS — I10 Essential (primary) hypertension: Secondary | ICD-10-CM | POA: Diagnosis not present

## 2020-01-19 DIAGNOSIS — E039 Hypothyroidism, unspecified: Secondary | ICD-10-CM | POA: Diagnosis not present

## 2020-01-19 DIAGNOSIS — R300 Dysuria: Secondary | ICD-10-CM | POA: Diagnosis not present

## 2020-01-19 DIAGNOSIS — R5383 Other fatigue: Secondary | ICD-10-CM | POA: Diagnosis not present

## 2020-01-19 DIAGNOSIS — E559 Vitamin D deficiency, unspecified: Secondary | ICD-10-CM | POA: Diagnosis not present

## 2020-02-11 DIAGNOSIS — M810 Age-related osteoporosis without current pathological fracture: Secondary | ICD-10-CM | POA: Diagnosis not present

## 2020-02-11 DIAGNOSIS — R5383 Other fatigue: Secondary | ICD-10-CM | POA: Diagnosis not present

## 2020-03-01 DIAGNOSIS — M25641 Stiffness of right hand, not elsewhere classified: Secondary | ICD-10-CM | POA: Diagnosis not present

## 2020-03-01 DIAGNOSIS — M79644 Pain in right finger(s): Secondary | ICD-10-CM | POA: Diagnosis not present

## 2020-03-01 DIAGNOSIS — R29898 Other symptoms and signs involving the musculoskeletal system: Secondary | ICD-10-CM | POA: Diagnosis not present

## 2020-03-01 DIAGNOSIS — R600 Localized edema: Secondary | ICD-10-CM | POA: Diagnosis not present

## 2020-03-02 DIAGNOSIS — M25641 Stiffness of right hand, not elsewhere classified: Secondary | ICD-10-CM | POA: Diagnosis not present

## 2020-03-02 DIAGNOSIS — R29898 Other symptoms and signs involving the musculoskeletal system: Secondary | ICD-10-CM | POA: Diagnosis not present

## 2020-03-02 DIAGNOSIS — M79644 Pain in right finger(s): Secondary | ICD-10-CM | POA: Diagnosis not present

## 2020-03-02 DIAGNOSIS — R600 Localized edema: Secondary | ICD-10-CM | POA: Diagnosis not present

## 2020-03-04 DIAGNOSIS — Z23 Encounter for immunization: Secondary | ICD-10-CM | POA: Diagnosis not present

## 2020-03-09 DIAGNOSIS — R600 Localized edema: Secondary | ICD-10-CM | POA: Diagnosis not present

## 2020-03-09 DIAGNOSIS — R29898 Other symptoms and signs involving the musculoskeletal system: Secondary | ICD-10-CM | POA: Diagnosis not present

## 2020-03-09 DIAGNOSIS — M25641 Stiffness of right hand, not elsewhere classified: Secondary | ICD-10-CM | POA: Diagnosis not present

## 2020-03-09 DIAGNOSIS — M79644 Pain in right finger(s): Secondary | ICD-10-CM | POA: Diagnosis not present

## 2020-03-15 DIAGNOSIS — R29898 Other symptoms and signs involving the musculoskeletal system: Secondary | ICD-10-CM | POA: Diagnosis not present

## 2020-03-15 DIAGNOSIS — M25641 Stiffness of right hand, not elsewhere classified: Secondary | ICD-10-CM | POA: Diagnosis not present

## 2020-03-15 DIAGNOSIS — M79644 Pain in right finger(s): Secondary | ICD-10-CM | POA: Diagnosis not present

## 2020-03-15 DIAGNOSIS — R600 Localized edema: Secondary | ICD-10-CM | POA: Diagnosis not present

## 2020-03-16 DIAGNOSIS — R29898 Other symptoms and signs involving the musculoskeletal system: Secondary | ICD-10-CM | POA: Diagnosis not present

## 2020-03-16 DIAGNOSIS — M79644 Pain in right finger(s): Secondary | ICD-10-CM | POA: Diagnosis not present

## 2020-03-16 DIAGNOSIS — R600 Localized edema: Secondary | ICD-10-CM | POA: Diagnosis not present

## 2020-03-16 DIAGNOSIS — M25641 Stiffness of right hand, not elsewhere classified: Secondary | ICD-10-CM | POA: Diagnosis not present

## 2020-03-25 DIAGNOSIS — M25641 Stiffness of right hand, not elsewhere classified: Secondary | ICD-10-CM | POA: Diagnosis not present

## 2020-03-25 DIAGNOSIS — R600 Localized edema: Secondary | ICD-10-CM | POA: Diagnosis not present

## 2020-03-25 DIAGNOSIS — M79644 Pain in right finger(s): Secondary | ICD-10-CM | POA: Diagnosis not present

## 2020-03-25 DIAGNOSIS — R29898 Other symptoms and signs involving the musculoskeletal system: Secondary | ICD-10-CM | POA: Diagnosis not present

## 2020-03-29 DIAGNOSIS — M79644 Pain in right finger(s): Secondary | ICD-10-CM | POA: Diagnosis not present

## 2020-03-29 DIAGNOSIS — R29898 Other symptoms and signs involving the musculoskeletal system: Secondary | ICD-10-CM | POA: Diagnosis not present

## 2020-03-29 DIAGNOSIS — R600 Localized edema: Secondary | ICD-10-CM | POA: Diagnosis not present

## 2020-03-29 DIAGNOSIS — M25641 Stiffness of right hand, not elsewhere classified: Secondary | ICD-10-CM | POA: Diagnosis not present

## 2020-03-31 DIAGNOSIS — R600 Localized edema: Secondary | ICD-10-CM | POA: Diagnosis not present

## 2020-03-31 DIAGNOSIS — M25641 Stiffness of right hand, not elsewhere classified: Secondary | ICD-10-CM | POA: Diagnosis not present

## 2020-03-31 DIAGNOSIS — R29898 Other symptoms and signs involving the musculoskeletal system: Secondary | ICD-10-CM | POA: Diagnosis not present

## 2020-03-31 DIAGNOSIS — M25541 Pain in joints of right hand: Secondary | ICD-10-CM | POA: Diagnosis not present

## 2020-03-31 DIAGNOSIS — M79644 Pain in right finger(s): Secondary | ICD-10-CM | POA: Diagnosis not present

## 2020-04-08 DIAGNOSIS — R300 Dysuria: Secondary | ICD-10-CM | POA: Diagnosis not present

## 2020-04-08 DIAGNOSIS — I10 Essential (primary) hypertension: Secondary | ICD-10-CM | POA: Diagnosis not present

## 2020-04-08 DIAGNOSIS — E1143 Type 2 diabetes mellitus with diabetic autonomic (poly)neuropathy: Secondary | ICD-10-CM | POA: Diagnosis not present

## 2020-04-08 DIAGNOSIS — E039 Hypothyroidism, unspecified: Secondary | ICD-10-CM | POA: Diagnosis not present

## 2020-04-13 DIAGNOSIS — M79651 Pain in right thigh: Secondary | ICD-10-CM | POA: Diagnosis not present

## 2020-04-13 DIAGNOSIS — S76911A Strain of unspecified muscles, fascia and tendons at thigh level, right thigh, initial encounter: Secondary | ICD-10-CM | POA: Diagnosis not present

## 2020-04-26 DIAGNOSIS — M545 Low back pain, unspecified: Secondary | ICD-10-CM | POA: Diagnosis not present

## 2020-04-26 DIAGNOSIS — M1611 Unilateral primary osteoarthritis, right hip: Secondary | ICD-10-CM | POA: Diagnosis not present

## 2020-04-26 DIAGNOSIS — M87051 Idiopathic aseptic necrosis of right femur: Secondary | ICD-10-CM | POA: Diagnosis not present

## 2020-05-10 DIAGNOSIS — Z8739 Personal history of other diseases of the musculoskeletal system and connective tissue: Secondary | ICD-10-CM | POA: Diagnosis not present

## 2020-05-10 DIAGNOSIS — M1611 Unilateral primary osteoarthritis, right hip: Secondary | ICD-10-CM | POA: Diagnosis not present

## 2020-05-10 DIAGNOSIS — M8700 Idiopathic aseptic necrosis of unspecified bone: Secondary | ICD-10-CM | POA: Diagnosis not present

## 2020-05-13 DIAGNOSIS — E1143 Type 2 diabetes mellitus with diabetic autonomic (poly)neuropathy: Secondary | ICD-10-CM | POA: Diagnosis not present

## 2020-05-13 DIAGNOSIS — I10 Essential (primary) hypertension: Secondary | ICD-10-CM | POA: Diagnosis not present

## 2020-05-13 DIAGNOSIS — E7800 Pure hypercholesterolemia, unspecified: Secondary | ICD-10-CM | POA: Diagnosis not present

## 2020-05-13 DIAGNOSIS — E559 Vitamin D deficiency, unspecified: Secondary | ICD-10-CM | POA: Diagnosis not present

## 2020-05-13 DIAGNOSIS — E7849 Other hyperlipidemia: Secondary | ICD-10-CM | POA: Diagnosis not present

## 2020-05-13 DIAGNOSIS — K2100 Gastro-esophageal reflux disease with esophagitis, without bleeding: Secondary | ICD-10-CM | POA: Diagnosis not present

## 2020-05-13 DIAGNOSIS — Z0001 Encounter for general adult medical examination with abnormal findings: Secondary | ICD-10-CM | POA: Diagnosis not present

## 2020-05-13 DIAGNOSIS — F411 Generalized anxiety disorder: Secondary | ICD-10-CM | POA: Diagnosis not present

## 2020-05-14 DIAGNOSIS — E1143 Type 2 diabetes mellitus with diabetic autonomic (poly)neuropathy: Secondary | ICD-10-CM | POA: Diagnosis not present

## 2020-05-14 DIAGNOSIS — I10 Essential (primary) hypertension: Secondary | ICD-10-CM | POA: Diagnosis not present

## 2020-05-14 DIAGNOSIS — K2100 Gastro-esophageal reflux disease with esophagitis, without bleeding: Secondary | ICD-10-CM | POA: Diagnosis not present

## 2020-05-14 DIAGNOSIS — Z6833 Body mass index (BMI) 33.0-33.9, adult: Secondary | ICD-10-CM | POA: Diagnosis not present

## 2020-05-14 DIAGNOSIS — E1165 Type 2 diabetes mellitus with hyperglycemia: Secondary | ICD-10-CM | POA: Diagnosis not present

## 2020-05-14 DIAGNOSIS — Z23 Encounter for immunization: Secondary | ICD-10-CM | POA: Diagnosis not present

## 2020-05-14 DIAGNOSIS — E785 Hyperlipidemia, unspecified: Secondary | ICD-10-CM | POA: Diagnosis not present

## 2020-05-14 DIAGNOSIS — E559 Vitamin D deficiency, unspecified: Secondary | ICD-10-CM | POA: Diagnosis not present

## 2020-05-14 DIAGNOSIS — R300 Dysuria: Secondary | ICD-10-CM | POA: Diagnosis not present

## 2020-05-17 ENCOUNTER — Other Ambulatory Visit (HOSPITAL_COMMUNITY): Payer: Self-pay | Admitting: Sports Medicine

## 2020-05-17 DIAGNOSIS — M25551 Pain in right hip: Secondary | ICD-10-CM

## 2020-05-17 DIAGNOSIS — M1611 Unilateral primary osteoarthritis, right hip: Secondary | ICD-10-CM | POA: Diagnosis not present

## 2020-05-20 DIAGNOSIS — Z6833 Body mass index (BMI) 33.0-33.9, adult: Secondary | ICD-10-CM | POA: Diagnosis not present

## 2020-05-20 DIAGNOSIS — S41152A Open bite of left upper arm, initial encounter: Secondary | ICD-10-CM | POA: Diagnosis not present

## 2020-05-25 ENCOUNTER — Encounter (HOSPITAL_COMMUNITY): Payer: Self-pay

## 2020-05-25 ENCOUNTER — Ambulatory Visit (HOSPITAL_COMMUNITY): Payer: Medicare Other

## 2020-05-27 DIAGNOSIS — S41152A Open bite of left upper arm, initial encounter: Secondary | ICD-10-CM | POA: Diagnosis not present

## 2020-05-27 DIAGNOSIS — Z6833 Body mass index (BMI) 33.0-33.9, adult: Secondary | ICD-10-CM | POA: Diagnosis not present

## 2020-05-31 DIAGNOSIS — M87051 Idiopathic aseptic necrosis of right femur: Secondary | ICD-10-CM | POA: Diagnosis not present

## 2020-05-31 DIAGNOSIS — M1611 Unilateral primary osteoarthritis, right hip: Secondary | ICD-10-CM | POA: Diagnosis not present

## 2020-06-09 DIAGNOSIS — E1143 Type 2 diabetes mellitus with diabetic autonomic (poly)neuropathy: Secondary | ICD-10-CM | POA: Diagnosis not present

## 2020-06-09 DIAGNOSIS — L0390 Cellulitis, unspecified: Secondary | ICD-10-CM | POA: Diagnosis not present

## 2020-06-09 DIAGNOSIS — W57XXXA Bitten or stung by nonvenomous insect and other nonvenomous arthropods, initial encounter: Secondary | ICD-10-CM | POA: Diagnosis not present

## 2020-06-09 DIAGNOSIS — I10 Essential (primary) hypertension: Secondary | ICD-10-CM | POA: Diagnosis not present

## 2020-06-24 DIAGNOSIS — E1143 Type 2 diabetes mellitus with diabetic autonomic (poly)neuropathy: Secondary | ICD-10-CM | POA: Diagnosis not present

## 2020-06-24 DIAGNOSIS — Z6833 Body mass index (BMI) 33.0-33.9, adult: Secondary | ICD-10-CM | POA: Diagnosis not present

## 2020-06-24 DIAGNOSIS — I10 Essential (primary) hypertension: Secondary | ICD-10-CM | POA: Diagnosis not present

## 2020-07-16 ENCOUNTER — Other Ambulatory Visit (HOSPITAL_COMMUNITY): Payer: Self-pay | Admitting: Nephrology

## 2020-07-16 ENCOUNTER — Other Ambulatory Visit: Payer: Self-pay | Admitting: Nephrology

## 2020-07-16 DIAGNOSIS — E1122 Type 2 diabetes mellitus with diabetic chronic kidney disease: Secondary | ICD-10-CM | POA: Diagnosis not present

## 2020-07-16 DIAGNOSIS — R809 Proteinuria, unspecified: Secondary | ICD-10-CM | POA: Diagnosis not present

## 2020-07-16 DIAGNOSIS — I951 Orthostatic hypotension: Secondary | ICD-10-CM

## 2020-07-16 DIAGNOSIS — I129 Hypertensive chronic kidney disease with stage 1 through stage 4 chronic kidney disease, or unspecified chronic kidney disease: Secondary | ICD-10-CM

## 2020-07-16 DIAGNOSIS — Z79899 Other long term (current) drug therapy: Secondary | ICD-10-CM | POA: Diagnosis not present

## 2020-07-16 DIAGNOSIS — N189 Chronic kidney disease, unspecified: Secondary | ICD-10-CM | POA: Diagnosis not present

## 2020-07-16 DIAGNOSIS — E1129 Type 2 diabetes mellitus with other diabetic kidney complication: Secondary | ICD-10-CM | POA: Diagnosis not present

## 2020-07-26 DIAGNOSIS — M545 Low back pain, unspecified: Secondary | ICD-10-CM | POA: Diagnosis not present

## 2020-07-26 DIAGNOSIS — M810 Age-related osteoporosis without current pathological fracture: Secondary | ICD-10-CM | POA: Diagnosis not present

## 2020-07-27 ENCOUNTER — Encounter (HOSPITAL_COMMUNITY): Payer: Self-pay

## 2020-07-27 ENCOUNTER — Ambulatory Visit (HOSPITAL_COMMUNITY): Payer: Medicare Other

## 2020-07-27 DIAGNOSIS — E559 Vitamin D deficiency, unspecified: Secondary | ICD-10-CM | POA: Diagnosis not present

## 2020-07-27 DIAGNOSIS — Z79899 Other long term (current) drug therapy: Secondary | ICD-10-CM | POA: Diagnosis not present

## 2020-07-27 DIAGNOSIS — R809 Proteinuria, unspecified: Secondary | ICD-10-CM | POA: Diagnosis not present

## 2020-07-27 DIAGNOSIS — E1129 Type 2 diabetes mellitus with other diabetic kidney complication: Secondary | ICD-10-CM | POA: Diagnosis not present

## 2020-07-27 DIAGNOSIS — N189 Chronic kidney disease, unspecified: Secondary | ICD-10-CM | POA: Diagnosis not present

## 2020-07-27 DIAGNOSIS — I129 Hypertensive chronic kidney disease with stage 1 through stage 4 chronic kidney disease, or unspecified chronic kidney disease: Secondary | ICD-10-CM | POA: Diagnosis not present

## 2020-07-27 DIAGNOSIS — I951 Orthostatic hypotension: Secondary | ICD-10-CM | POA: Diagnosis not present

## 2020-07-27 DIAGNOSIS — E1122 Type 2 diabetes mellitus with diabetic chronic kidney disease: Secondary | ICD-10-CM | POA: Diagnosis not present

## 2020-08-02 ENCOUNTER — Other Ambulatory Visit: Payer: Self-pay

## 2020-08-02 ENCOUNTER — Ambulatory Visit (HOSPITAL_COMMUNITY)
Admission: RE | Admit: 2020-08-02 | Discharge: 2020-08-02 | Disposition: A | Discharge status: Home / Self Care | Payer: Medicare Other | Source: Ambulatory Visit | Attending: Nephrology | Admitting: Nephrology

## 2020-08-02 DIAGNOSIS — R809 Proteinuria, unspecified: Secondary | ICD-10-CM | POA: Diagnosis not present

## 2020-08-02 DIAGNOSIS — E1129 Type 2 diabetes mellitus with other diabetic kidney complication: Secondary | ICD-10-CM | POA: Insufficient documentation

## 2020-08-02 DIAGNOSIS — I129 Hypertensive chronic kidney disease with stage 1 through stage 4 chronic kidney disease, or unspecified chronic kidney disease: Secondary | ICD-10-CM | POA: Insufficient documentation

## 2020-08-02 DIAGNOSIS — I951 Orthostatic hypotension: Secondary | ICD-10-CM | POA: Diagnosis not present

## 2020-08-02 DIAGNOSIS — E1122 Type 2 diabetes mellitus with diabetic chronic kidney disease: Secondary | ICD-10-CM | POA: Insufficient documentation

## 2020-08-23 DIAGNOSIS — G894 Chronic pain syndrome: Secondary | ICD-10-CM | POA: Diagnosis not present

## 2020-08-23 DIAGNOSIS — M47816 Spondylosis without myelopathy or radiculopathy, lumbar region: Secondary | ICD-10-CM | POA: Diagnosis not present

## 2020-08-23 DIAGNOSIS — M7918 Myalgia, other site: Secondary | ICD-10-CM | POA: Diagnosis not present

## 2020-09-14 DIAGNOSIS — G894 Chronic pain syndrome: Secondary | ICD-10-CM | POA: Diagnosis not present

## 2020-09-14 DIAGNOSIS — K219 Gastro-esophageal reflux disease without esophagitis: Secondary | ICD-10-CM | POA: Diagnosis not present

## 2020-09-14 DIAGNOSIS — Z888 Allergy status to other drugs, medicaments and biological substances status: Secondary | ICD-10-CM | POA: Diagnosis not present

## 2020-09-14 DIAGNOSIS — Z91048 Other nonmedicinal substance allergy status: Secondary | ICD-10-CM | POA: Diagnosis not present

## 2020-09-14 DIAGNOSIS — Z79899 Other long term (current) drug therapy: Secondary | ICD-10-CM | POA: Diagnosis not present

## 2020-09-14 DIAGNOSIS — M1990 Unspecified osteoarthritis, unspecified site: Secondary | ICD-10-CM | POA: Diagnosis not present

## 2020-09-14 DIAGNOSIS — E119 Type 2 diabetes mellitus without complications: Secondary | ICD-10-CM | POA: Diagnosis not present

## 2020-09-14 DIAGNOSIS — Z882 Allergy status to sulfonamides status: Secondary | ICD-10-CM | POA: Diagnosis not present

## 2020-09-14 DIAGNOSIS — I10 Essential (primary) hypertension: Secondary | ICD-10-CM | POA: Diagnosis not present

## 2020-09-14 DIAGNOSIS — M47816 Spondylosis without myelopathy or radiculopathy, lumbar region: Secondary | ICD-10-CM | POA: Diagnosis not present

## 2020-09-14 DIAGNOSIS — Z794 Long term (current) use of insulin: Secondary | ICD-10-CM | POA: Diagnosis not present

## 2020-09-17 DIAGNOSIS — N200 Calculus of kidney: Secondary | ICD-10-CM | POA: Diagnosis not present

## 2020-09-17 DIAGNOSIS — E872 Acidosis: Secondary | ICD-10-CM | POA: Diagnosis not present

## 2020-09-17 DIAGNOSIS — N189 Chronic kidney disease, unspecified: Secondary | ICD-10-CM | POA: Diagnosis not present

## 2020-09-17 DIAGNOSIS — E1122 Type 2 diabetes mellitus with diabetic chronic kidney disease: Secondary | ICD-10-CM | POA: Diagnosis not present

## 2020-09-17 DIAGNOSIS — I951 Orthostatic hypotension: Secondary | ICD-10-CM | POA: Diagnosis not present

## 2020-09-17 DIAGNOSIS — K760 Fatty (change of) liver, not elsewhere classified: Secondary | ICD-10-CM | POA: Diagnosis not present

## 2020-09-17 DIAGNOSIS — E6609 Other obesity due to excess calories: Secondary | ICD-10-CM | POA: Diagnosis not present

## 2020-09-17 DIAGNOSIS — I129 Hypertensive chronic kidney disease with stage 1 through stage 4 chronic kidney disease, or unspecified chronic kidney disease: Secondary | ICD-10-CM | POA: Diagnosis not present

## 2020-09-30 DIAGNOSIS — Z6833 Body mass index (BMI) 33.0-33.9, adult: Secondary | ICD-10-CM | POA: Diagnosis not present

## 2020-09-30 DIAGNOSIS — H00016 Hordeolum externum left eye, unspecified eyelid: Secondary | ICD-10-CM | POA: Diagnosis not present

## 2020-09-30 DIAGNOSIS — H5712 Ocular pain, left eye: Secondary | ICD-10-CM | POA: Diagnosis not present

## 2020-09-30 DIAGNOSIS — R519 Headache, unspecified: Secondary | ICD-10-CM | POA: Diagnosis not present

## 2020-10-18 DIAGNOSIS — Z91041 Radiographic dye allergy status: Secondary | ICD-10-CM | POA: Diagnosis not present

## 2020-10-18 DIAGNOSIS — E119 Type 2 diabetes mellitus without complications: Secondary | ICD-10-CM | POA: Diagnosis not present

## 2020-10-18 DIAGNOSIS — Z882 Allergy status to sulfonamides status: Secondary | ICD-10-CM | POA: Diagnosis not present

## 2020-10-18 DIAGNOSIS — G894 Chronic pain syndrome: Secondary | ICD-10-CM | POA: Diagnosis not present

## 2020-10-18 DIAGNOSIS — Z888 Allergy status to other drugs, medicaments and biological substances status: Secondary | ICD-10-CM | POA: Diagnosis not present

## 2020-10-18 DIAGNOSIS — Z79899 Other long term (current) drug therapy: Secondary | ICD-10-CM | POA: Diagnosis not present

## 2020-10-18 DIAGNOSIS — Z794 Long term (current) use of insulin: Secondary | ICD-10-CM | POA: Diagnosis not present

## 2020-10-18 DIAGNOSIS — K219 Gastro-esophageal reflux disease without esophagitis: Secondary | ICD-10-CM | POA: Diagnosis not present

## 2020-10-18 DIAGNOSIS — M47816 Spondylosis without myelopathy or radiculopathy, lumbar region: Secondary | ICD-10-CM | POA: Diagnosis not present

## 2020-10-18 DIAGNOSIS — Z791 Long term (current) use of non-steroidal anti-inflammatories (NSAID): Secondary | ICD-10-CM | POA: Diagnosis not present

## 2020-10-18 DIAGNOSIS — I10 Essential (primary) hypertension: Secondary | ICD-10-CM | POA: Diagnosis not present

## 2020-11-15 DIAGNOSIS — Z79899 Other long term (current) drug therapy: Secondary | ICD-10-CM | POA: Diagnosis not present

## 2020-11-15 DIAGNOSIS — M159 Polyosteoarthritis, unspecified: Secondary | ICD-10-CM | POA: Diagnosis not present

## 2020-11-15 DIAGNOSIS — Z882 Allergy status to sulfonamides status: Secondary | ICD-10-CM | POA: Diagnosis not present

## 2020-11-15 DIAGNOSIS — Z91041 Radiographic dye allergy status: Secondary | ICD-10-CM | POA: Diagnosis not present

## 2020-11-15 DIAGNOSIS — M47816 Spondylosis without myelopathy or radiculopathy, lumbar region: Secondary | ICD-10-CM | POA: Insufficient documentation

## 2020-11-15 DIAGNOSIS — Z7951 Long term (current) use of inhaled steroids: Secondary | ICD-10-CM | POA: Diagnosis not present

## 2020-11-15 DIAGNOSIS — Z888 Allergy status to other drugs, medicaments and biological substances status: Secondary | ICD-10-CM | POA: Diagnosis not present

## 2020-11-15 DIAGNOSIS — Z7982 Long term (current) use of aspirin: Secondary | ICD-10-CM | POA: Diagnosis not present

## 2020-11-15 DIAGNOSIS — K219 Gastro-esophageal reflux disease without esophagitis: Secondary | ICD-10-CM | POA: Diagnosis not present

## 2020-11-15 DIAGNOSIS — E119 Type 2 diabetes mellitus without complications: Secondary | ICD-10-CM | POA: Diagnosis not present

## 2020-11-15 DIAGNOSIS — I10 Essential (primary) hypertension: Secondary | ICD-10-CM | POA: Diagnosis not present

## 2020-11-15 DIAGNOSIS — G894 Chronic pain syndrome: Secondary | ICD-10-CM | POA: Diagnosis not present

## 2020-11-16 DIAGNOSIS — I10 Essential (primary) hypertension: Secondary | ICD-10-CM | POA: Diagnosis not present

## 2020-11-16 DIAGNOSIS — Z6833 Body mass index (BMI) 33.0-33.9, adult: Secondary | ICD-10-CM | POA: Diagnosis not present

## 2020-11-16 DIAGNOSIS — E1143 Type 2 diabetes mellitus with diabetic autonomic (poly)neuropathy: Secondary | ICD-10-CM | POA: Diagnosis not present

## 2020-12-06 DIAGNOSIS — M810 Age-related osteoporosis without current pathological fracture: Secondary | ICD-10-CM | POA: Diagnosis not present

## 2020-12-15 DIAGNOSIS — L304 Erythema intertrigo: Secondary | ICD-10-CM | POA: Diagnosis not present

## 2020-12-15 DIAGNOSIS — Z6833 Body mass index (BMI) 33.0-33.9, adult: Secondary | ICD-10-CM | POA: Diagnosis not present

## 2020-12-15 DIAGNOSIS — R300 Dysuria: Secondary | ICD-10-CM | POA: Diagnosis not present

## 2020-12-15 DIAGNOSIS — B351 Tinea unguium: Secondary | ICD-10-CM | POA: Diagnosis not present

## 2020-12-16 NOTE — Progress Notes (Addendum)
Cardiology Office Note  Date: 12/17/2020   ID: Kelly Wiley, DOB [birth year], MRN [id number]  PCP:  Rory Percy, MD  Cardiologist:  None Electrophysiologist:  None   Chief Complaint: Orthostatic hypotension / Syncope  History of Present Illness: Kelly Wiley is a 65 y.o. female with a history of HTN, DM2, Dysphagia, Fibromyalgia, GERD, HLD, cervical spine disease.  She was last seen by Dr. Bronson Ing on 07/09/2018 via telemedicine.  She described feeling worn out and tired.  Her blood pressure was elevated at home at 185/120.  She took her home medications and it came down to 154/100.  The prior night after taking 100 mg of losartan blood pressure dropped to 80/77.  She stated this was the first time this is happened in quite some time.  She had some chest heaviness with a pounding sensation.  She had palpitations for 3 to 4 days.  During telemedicine visit all of the symptoms had subsided.  Dr. Bronson Ing mentioned fluctuating blood pressure i was likely due to autonomic neuropathy.  He encouraged exercising to help regulate vascular tone.  He asked her to keep legs elevated to raise blood pressure when she noticed it dropping.  She was also encouraged to push fluid intake and was recommended compression stockings.  She had no recurrence of syncope since discontinuation of her diuretic.  Dr. Bronson Ing mentioned prior episodes possibly due to autonomic neuropathy given diabetes and hypotension which appeared to precipitate episodes.  He had previously informed her that opioid use for back pain may cause drops in blood pressure and she should lie down after taking these medications to avoid near syncope if blood pressure dropped. She had some chest heaviness and palpitations. The symptoms had occurred while BP was elevated but had since subsided. She was told to keep a symptoms diary to see if there was a correlation with her BP.   She was recently seen by her primary care provider on  11/16/2020 for hypertension.  She stated her blood pressure had been all over the place.  Lowest blood pressure was around 77/54 and highest was 248/103.  She described it as intermittent fluctuating blood pressure.  Did not seem to have a particular pattern.  The symptoms started approximately 4 months prior.  She states her losartan was discontinued by primary care provider and she was continuing carvedilol and amlodipine.  She also complains of palpitations slow/pounding heartbeat at times.  Complains of issues with low energy feeling as though she is give out at times.  She has diabetes and recent hemoglobin A1c was 7.3%.  Recent lipid level was 162 at PCP office.  She has statin intolerances.  Blood pressure today on arrival was 118/70.  She states she has not taken any blood pressure medications today.  We talked about initially stopping all antihypertensive medications and slowly adding based on blood pressure results.  She is in agreement.  Addendum dated 01/04/2021 from recent blood pressure list brought in by outpatient.  Had this conversation with Georgina Peer, RN. In reference to the blood pressure record she brought back to me earlier.  Tell her to start amlodipine at 5 mg daily.  Increase carvedilol to 6.25 mg p.o. twice daily.  In 2 weeks call us back with update on blood pressures.  Also she states she has not received her ZIO monitor.  Can you check on that please.  Thank you   Past Medical History:  Diagnosis Date   Cervical spine disease  DM (diabetes mellitus) ([place])    Dysphagia    Fibromyalgia    GERD (gastroesophageal reflux disease)    HTN (hypertension)    Hypercholesteremia    PONV (postoperative nausea and vomiting)    Seasonal allergies    Seizures ([place])    last one > 20 years ago    Past Surgical History:  Procedure Laterality Date   BACK SURGERY     CARPAL TUNNEL RELEASE  RIGHT   CERVICAL SPINE SURGERY  06/19/2010   COLONOSCOPY  2008 [place] [facility]    ESOPHAGOGASTRODUODENOSCOPY  01/24/2011   QAS:[id number] 2o TO CERVICAL PLATES IN PT'S NECK AND MILD ESO MOTILITY DISORDER/Mild gastritis   ESOPHAGOGASTRODUODENOSCOPY  03/03/2011   NLG:XQJJ gastritis    FLEXIBLE SIGMOIDOSCOPY  01/24/2011   SLF: Internal Hemorrhoids   SAVORY DILATION  01/24/2011   Procedure: SAVORY DILATION;  Surgeon: Dorothyann Peng, MD;  [facility];  Service: Endoscopy;  Laterality: N/A;   SHOULDER SURGERY  RIGHT   TUBAL LIGATION      Current Outpatient Medications  Medication Sig Dispense Refill   amLODipine (NORVASC) 2.5 MG tablet TAKE 1 TABLET BY MOUTH EVERY MORNING. 90 tablet 3   aspirin-acetaminophen-caffeine (EXCEDRIN MIGRAINE) 941-740-81 MG per tablet Take 1 tablet by mouth every 6 (six) hours as needed. headache      carvedilol (COREG) 3.125 MG tablet Take 3.125 mg by mouth 2 (two) times daily.     cholecalciferol (VITAMIN D3) 25 MCG (1000 UNIT) tablet Take 1,000 Units by mouth daily.     Insulin NPH Human, Isophane, (NOVOLIN N FLEXPEN [place]) Inject into the skin.     insulin regular (NOVOLIN R,HUMULIN R) 100 units/mL injection Inject into the skin 2 (two) times daily. Sliding scale     loratadine (CLARITIN) 10 MG tablet Take 10 mg by mouth daily as needed. For allergies     losartan (COZAAR) 100 MG tablet Take 100 mg by mouth daily.     pantoprazole (PROTONIX) 40 MG tablet Take 1 tablet by mouth 2 (two) times daily.      TYMLOS 3120 MCG/1.56ML SOPN Inject into the skin.     metoprolol succinate (TOPROL-XL) 25 MG 24 hr tablet TAKE ONE-HALF (1/2) TABLET DAILY. TAKE WITH OR IMMEDIATELY FOLLOWING A MEAL. SCHEDULE APPOINTMENT FOR FUTURE REFILLS (Patient not taking: Reported on 12/17/2020) 30 tablet 6   pramipexole (MIRAPEX) 1 MG tablet Take 1 tablet by mouth 2 (two) times daily. (Patient not taking: Reported on 12/17/2020)     No current facility-administered medications for this visit.   Allergies:  Ativan [lorazepam], Ethosuximide, Iodine 131, Phenytoin  sodium extended, and Sulfa antibiotics   Social History: The patient  reports that she quit smoking about 52 years ago. Her smoking use included cigarettes. She started smoking about 54 years ago. She has a 1.00 pack-year smoking history. She has never used smokeless tobacco. She reports current alcohol use. She reports that she does not use drugs.   Family History: The patient's family history includes Congenital heart disease in her son; Diabetes in her son; Heart disease in her mother; Lung cancer in her mother.   ROS:  Please see the history of present illness. Otherwise, complete review of systems is positive for none.  All other systems are reviewed and negative.   Physical Exam: VS:  BP 118/70   Pulse 63   Ht 5\' 2"  (1.575 m)   Wt 187 lb (84.8 kg)   SpO2 98%   BMI 34.20  kg/m , BMI Body mass index is 34.2 kg/m.  Wt Readings from Last 3 Encounters:  12/17/20 187 lb (84.8 kg)  07/09/18 190 lb (86.2 kg)  03/18/18 194 lb (88 kg)    General: Obese patient appears comfortable at rest. Neck: Supple, no elevated JVP or carotid bruits, no thyromegaly. Lungs: Clear to auscultation, nonlabored breathing at rest. Cardiac: Regular rate and rhythm, no S3 or significant systolic murmur, no pericardial rub. Extremities: No pitting edema, distal pulses 2+. Skin: Warm and dry. Musculoskeletal: No kyphosis. Neuropsychiatric: Alert and oriented x3, affect grossly appropriate.  ECG: EKG today normal sinus rhythm rate of 67  Recent Labwork: No results found for requested labs within last 8760 hours.  No results found for: CHOL, TRIG, HDL, CHOLHDL, VLDL, LDLCALC, LDLDIRECT  Other Studies Reviewed Today:   Assessment and Plan:  1. Fluctuating blood pressure   2. Palpitations   3. Need for immunization against influenza   4. Allergy to statin medication   5. Mixed hyperlipidemia   6. Type 2 diabetes mellitus without complication, with long-term current use of insulin ([place])    1.  Fluctuating blood pressure Patient states she is having issues with fluctuating blood pressures.  She was previously on losartan, carvedilol, amlodipine.  Her PCP had recently stopped her losartan and left her on carvedilol and amlodipine.  She states she still has some issues with fluctuating blood pressures.  Recent PCP visit on 11/16/2020 she stated her blood pressures had been all over the place with lowest blood pressure around 77/54 and highest blood pressure at 248/103.  Stated the symptoms started 4 months prior he stated this occurred daily.  She states she has not taken any of her blood pressure medications today.  Blood pressure today is 118/70.  I asked her to stop all antihypertensive medications for 1 week and check her blood pressures and update [place] in 1 week.  Will add medications back one by one based on blood pressure measurements.  She states she was recently sent to Dr. Theador Hawthorne nephrology for blood pressure issues by PCP.  2. Palpitations Complains of palpitations with bounding heartbeats at times.  States she feels this nightly sometimes with an occasional heart beating.  Please get a 14-day ZIO monitor to assess for arrhythmias.  3. Need for immunization against influenza She is requesting flu vaccine  4. Allergy to statin medication She has allergies to statin medication.  Most recent LDL was 162.     5. Mixed hyperlipidemia  Most recent LDL was 162.  She has a history of diabetes and needs to be antilipid medication PCSK9 inhibitor possibly.  Please use referral to lipid clinic.   6.  Diabetes History of diabetes.  Most recent hemoglobin A1c was 7.3%.     Medication Adjustments/Labs and Tests Ordered: Current medicines are reviewed at length with the patient today.  Concerns regarding medicines are outlined above.   Disposition: Follow-up with Dr. Harl Bowie or APP 8 weeks.  Signed, Levell July, NP 12/17/2020 2:23 PM    [facility] [place], [place], [place] [address] Phone: [phone]; Fax: [phone]

## 2020-12-17 ENCOUNTER — Ambulatory Visit (INDEPENDENT_AMBULATORY_CARE_PROVIDER_SITE_OTHER): Payer: Medicare Other | Admitting: Family Medicine

## 2020-12-17 ENCOUNTER — Encounter: Payer: Self-pay | Admitting: Family Medicine

## 2020-12-17 ENCOUNTER — Other Ambulatory Visit: Payer: Self-pay

## 2020-12-17 ENCOUNTER — Other Ambulatory Visit: Payer: Self-pay | Admitting: Family Medicine

## 2020-12-17 ENCOUNTER — Ambulatory Visit (INDEPENDENT_AMBULATORY_CARE_PROVIDER_SITE_OTHER): Payer: Medicare Other

## 2020-12-17 VITALS — BP 118/70 | HR 63 | Ht 62.0 in | Wt 187.0 lb

## 2020-12-17 DIAGNOSIS — R002 Palpitations: Secondary | ICD-10-CM

## 2020-12-17 DIAGNOSIS — Z794 Long term (current) use of insulin: Secondary | ICD-10-CM

## 2020-12-17 DIAGNOSIS — Z888 Allergy status to other drugs, medicaments and biological substances status: Secondary | ICD-10-CM | POA: Diagnosis not present

## 2020-12-17 DIAGNOSIS — E782 Mixed hyperlipidemia: Secondary | ICD-10-CM | POA: Diagnosis not present

## 2020-12-17 DIAGNOSIS — I951 Orthostatic hypotension: Secondary | ICD-10-CM

## 2020-12-17 DIAGNOSIS — Z23 Encounter for immunization: Secondary | ICD-10-CM | POA: Diagnosis not present

## 2020-12-17 DIAGNOSIS — I998 Other disorder of circulatory system: Secondary | ICD-10-CM

## 2020-12-17 DIAGNOSIS — E119 Type 2 diabetes mellitus without complications: Secondary | ICD-10-CM

## 2020-12-17 DIAGNOSIS — R55 Syncope and collapse: Secondary | ICD-10-CM

## 2020-12-17 NOTE — Patient Instructions (Signed)
Medication Instructions:  Your physician recommends that you continue on your current medications as directed. Please refer to the Current Medication list given to you today.  ONLY TAKE AMLODIPINE IN THE MORNING.  CALL OFFICE IN ONE WEEK WITH BP READINGS.   *If you need a refill on your cardiac medications before your next appointment, please call your pharmacy*   Lab Work: None If you have labs (blood work) drawn today and your tests are completely normal, you will receive your results only by: [place] (if you have MyChart) OR A paper copy in the mail If you have any lab test that is abnormal or we need to change your treatment, we will call you to review the results.   Testing/Procedures: None   Follow-Up: At [facility], you and your health needs are our priority.  As part of our continuing mission to provide you with exceptional heart care, we have created designated Provider Care Teams.  These Care Teams include your primary Cardiologist (physician) and Advanced Practice Providers (APPs -  Physician Assistants and Nurse Practitioners) who all work together to provide you with the care you need, when you need it.  We recommend signing up for the patient portal called "MyChart".  Sign up information is provided on this After Visit Summary.  MyChart is used to connect with patients for Virtual Visits (Telemedicine).  Patients are able to view lab/test results, encounter notes, upcoming appointments, etc.  Non-urgent messages can be sent to your provider as well.   To learn more about what you can do with MyChart, go to [email or web address].    Your next appointment:   8 week(s)  The format for your next appointment:   In Person  Provider:   Katina Dung, NP    Other Instructions ZIO XT- Long Term Monitor Instructions   Your physician has requested you wear your ZIO patch monitor___14____days.   This is a single patch monitor.  [employer] supplies one patch  monitor per enrollment.  Additional stickers are not available.   Please do not apply patch if you will be having a Nuclear Stress Test, Echocardiogram, Cardiac CT, MRI, or Chest Xray during the time frame you would be wearing the monitor. The patch cannot be worn during these tests.  You cannot remove and re-apply the ZIO XT patch monitor.   Your ZIO patch monitor will be sent USPS Priority mail from [facility] directly to your home address. The monitor may also be mailed to a PO BOX if home delivery is not available.   It may take 3-5 days to receive your monitor after you have been enrolled.   Once you have received you monitor, please review enclosed instructions.  Your monitor has already been registered assigning a specific monitor serial # to you.   Applying the monitor   Shave hair from upper left chest.   Hold abrader disc by orange tab.  Rub abrader in 40 strokes over left upper chest as indicated in your monitor instructions.   Clean area with 4 enclosed alcohol pads .  Use all pads to assure are is cleaned thoroughly.  Let dry.   Apply patch as indicated in monitor instructions.  Patch will be place under collarbone on left side of chest with arrow pointing upward.   Rub patch adhesive wings for 2 minutes.Remove white label marked "1".  Remove white label marked "2".  Rub patch adhesive wings for 2 additional minutes.   While looking in a mirror, press and  release button in center of patch.  A small green light will flash 3-4 times .  This will be your only indicator the monitor has been turned on.     Do not shower for the first 24 hours.  You may shower after the first 24 hours.   Press button if you feel a symptom. You will hear a small click.  Record Date, Time and Symptom in the Patient Log Book.   When you are ready to remove patch, follow instructions on last 2 pages of Patient Log Book.  Stick patch monitor onto last page of Patient Log Book.   Place Patient  Log Book in [place] box.  Use locking tab on box and tape box closed securely.  The [employer] and [employer] has [employer] on it.  Please place in mailbox as soon as possible.  Your physician should have your test results approximately 7 days after the monitor has been mailed back to [facility].   Call [place] at [phone] if you have questions regarding your ZIO XT patch monitor.  Call them immediately if you see an orange light blinking on your monitor.   If your monitor falls off in less than 4 days contact our Monitor department at [phone].  If your monitor becomes loose or falls off after 4 days call [employer] at [phone] for suggestions on securing your monitor.

## 2021-01-04 ENCOUNTER — Telehealth: Payer: Self-pay | Admitting: Family Medicine

## 2021-01-04 ENCOUNTER — Telehealth: Payer: Self-pay | Admitting: *Deleted

## 2021-01-04 MED ORDER — CARVEDILOL 6.25 MG PO TABS: 6.2500 mg | ORAL_TABLET | Freq: Two times a day (BID) | ORAL | 1 refills | Status: DC

## 2021-01-04 MED ORDER — AMLODIPINE BESYLATE 5 MG PO TABS: 5.0000 mg | ORAL_TABLET | Freq: Every day | ORAL | 1 refills | Status: DC

## 2021-01-04 NOTE — Telephone Encounter (Signed)
Per Katina Dung, NP-In reference to the blood pressure record she brought back to me earlier.  Tell her to increase amlodipine at 5 mg daily.  Increase carvedilol to 6.25 mg p.o. twice daily.  In 2 weeks call us back with update on blood pressures.    Also she states she has not received her ZIO monitor.  Can you check on that please.  Thank you

## 2021-01-04 NOTE — Telephone Encounter (Signed)
Correction to enrollment completed today Should receive monitor by end of week

## 2021-01-04 NOTE — Telephone Encounter (Signed)
Patient hasn't received her heart monitor.

## 2021-01-04 NOTE — Telephone Encounter (Signed)
Patient informed and verbalized understanding of plan. ZIO enrollment completed

## 2021-01-06 ENCOUNTER — Ambulatory Visit: Payer: Medicare Other

## 2021-01-12 DIAGNOSIS — R002 Palpitations: Secondary | ICD-10-CM

## 2021-01-13 DIAGNOSIS — R350 Frequency of micturition: Secondary | ICD-10-CM | POA: Diagnosis not present

## 2021-01-13 DIAGNOSIS — R300 Dysuria: Secondary | ICD-10-CM | POA: Diagnosis not present

## 2021-01-13 DIAGNOSIS — Z6833 Body mass index (BMI) 33.0-33.9, adult: Secondary | ICD-10-CM | POA: Diagnosis not present

## 2021-01-13 DIAGNOSIS — G4700 Insomnia, unspecified: Secondary | ICD-10-CM | POA: Diagnosis not present

## 2021-01-19 ENCOUNTER — Other Ambulatory Visit: Payer: Self-pay

## 2021-01-19 ENCOUNTER — Ambulatory Visit (INDEPENDENT_AMBULATORY_CARE_PROVIDER_SITE_OTHER): Payer: Medicare Other | Admitting: Pharmacist Clinician (PhC)/ Clinical Pharmacy Specialist

## 2021-01-19 DIAGNOSIS — E782 Mixed hyperlipidemia: Secondary | ICD-10-CM

## 2021-01-19 NOTE — Progress Notes (Signed)
01/20/2021 Kelly Wiley 1955/03/29 [id number]   HPI:  Kelly Wiley is a 65 y.o. female patient formerly of Dr. Bronson Ing, who presents today for a lipid clinic evaluation.  See pertinent past medical history below.   Her last visit was with Levell July NP just a month ago.  At that visit, her blood pressure appeared well controlled in the office, however home readings were more elevated.  Based on that, she was started on amlodipine 5 mg daily and asked to increase carvedilol to 6.25 mg bid.    She is in the office today to discuss options for lowering her cholesterol.  Her 10 year ASCVD risk is at 30%.    Past Medical History: DM2 Novolin N, Novolin R  dysphagia   hypertension Working with Lawanda Cousins NP at [facility] office  GERD On protonix bid  fibromyalgia    Current Medications: none  Cholesterol Goals: LDL < 100   Intolerant/previously tried: atorvastatin, rosuvsatatin - both many years ago, caused myalgias  Family history: mother died from lung cancer at 60; mini stroke, smoker; father living at 42; 2 siblings, no ASCVD that pt aware of; son DM1, congenital heart issues, daughter healthy  Diet: mostly home cooked meals, mostly vegetarian, occasional milk/cream; veggies fresh/frozen; does eat occasional meat, mostly peanut better, some beans  Exercise:  no regular exercise, has back issues  Labs:  2/22: TC 251, TG 282, HDL 35, LDL 162   Current Outpatient Medications  Medication Sig Dispense Refill   amLODipine (NORVASC) 5 MG tablet Take 1 tablet (5 mg total) by mouth daily. 90 tablet 1   amoxicillin-clavulanate (AUGMENTIN) 875-125 MG tablet Take 1 tablet by mouth 2 (two) times daily.     aspirin-acetaminophen-caffeine (EXCEDRIN MIGRAINE) 250-250-65 MG per tablet Take 1 tablet by mouth every 6 (six) hours as needed. headache      carvedilol (COREG) 6.25 MG tablet Take 1 tablet (6.25 mg total) by mouth 2 (two) times daily. 180 tablet 1   clonazePAM (KLONOPIN) 0.5 MG tablet Take  0.5 mg by mouth daily as needed.     Insulin NPH Human, Isophane, (NOVOLIN N FLEXPEN [place]) Inject into the skin.     insulin regular (NOVOLIN R,HUMULIN R) 100 units/mL injection Inject into the skin 2 (two) times daily. Sliding scale     loratadine (CLARITIN) 10 MG tablet Take 10 mg by mouth daily as needed. For allergies     pantoprazole (PROTONIX) 40 MG tablet Take 1 tablet by mouth 2 (two) times daily.      TYMLOS 3120 MCG/1.56ML SOPN Inject into the skin.     Vitamin D, Ergocalciferol, (DRISDOL) 1.25 MG (50000 UNIT) CAPS capsule Take 50,000 Units by mouth 2 (two) times a week.     cholecalciferol (VITAMIN D3) 25 MCG (1000 UNIT) tablet Take 1,000 Units by mouth daily. (Patient not taking: Reported on 01/19/2021)     losartan (COZAAR) 100 MG tablet Take 100 mg by mouth daily. (Patient not taking: Reported on 01/19/2021)     pramipexole (MIRAPEX) 1 MG tablet Take 1 tablet by mouth 2 (two) times daily. (Patient not taking: No sig reported)     No current facility-administered medications for this visit.    Allergies  Allergen Reactions   Ativan [Lorazepam]     Lowered blood pressure   Ethosuximide Other (See Comments)    Does not know    Iodine 131 Other (See Comments)    Blacked out    Phenytoin Sodium Extended Other (See Comments)  Killed white blood cell at age of 8    Sulfa Antibiotics Other (See Comments)    Bones     Past Medical History:  Diagnosis Date   Cervical spine disease    DM (diabetes mellitus) (HCC)    Dysphagia    Fibromyalgia    GERD (gastroesophageal reflux disease)    HTN (hypertension)    Hypercholesteremia    PONV (postoperative nausea and vomiting)    Seasonal allergies    Seizures (HCC)    last one > 20 years ago    Blood pressure (!) 168/84, pulse 90, resp. rate 16, height 5\' 2"  (1.575 m), weight 192 lb (87.1 kg), SpO2 97 %.   Hyperlipidemia Patient with mixed hyperlipidemia, LDL currently at 162.  Reviewed options for lowering LDL  cholesterol, including ezetimibe, PCSK-9 inhibitors, bempedoic acid and inclisiran.  Discussed mechanisms of action, dosing, side effects and potential decreases in LDL cholesterol.  Also reviewed cost information and potential options for patient assistance.  Answered all patient questions.  Based on this information, patient would prefer to start [place].   Will submit PA, and once approved she will use 140 mg q14d.  Need to repeat labs after 4-6 doses   Tommy Medal PharmD CPP [place] [address] [place], [place] [address] [phone]

## 2021-01-19 NOTE — Patient Instructions (Addendum)
Your Results:             Your most recent labs Goal  Total Cholesterol 251 < 200  Triglycerides 285 < 150  HDL (happy/good cholesterol) 35 > 40  LDL (lousy/bad cholesterol 162 < 70   Medication changes:  We will start the process to get Repatha covered by your insurance.  Once approved inject 1 pen every 14 days.    Lab orders:  Repeat cholesterol labs after 4-5 doses.  If you have any questions or concerns with the medication, please reach out to [place] (Rhanda Lemire/Chris) at [phone]  Thank you for choosing [facility]

## 2021-01-20 ENCOUNTER — Telehealth: Payer: Self-pay | Admitting: Pharmacist Clinician (PhC)/ Clinical Pharmacy Specialist

## 2021-01-20 ENCOUNTER — Encounter: Payer: Self-pay | Admitting: Pharmacist Clinician (PhC)/ Clinical Pharmacy Specialist

## 2021-01-20 DIAGNOSIS — E785 Hyperlipidemia, unspecified: Secondary | ICD-10-CM | POA: Insufficient documentation

## 2021-01-20 NOTE — Assessment & Plan Note (Signed)
Patient with mixed hyperlipidemia, LDL currently at 162.  Reviewed options for lowering LDL cholesterol, including ezetimibe, PCSK-9 inhibitors, bempedoic acid and inclisiran.  Discussed mechanisms of action, dosing, side effects and potential decreases in LDL cholesterol.  Also reviewed cost information and potential options for patient assistance.  Answered all patient questions.  Based on this information, patient would prefer to start [place].   Will submit PA, and once approved she will use 140 mg q14d.  Need to repeat labs after 4-6 doses

## 2021-01-20 NOTE — Telephone Encounter (Signed)
Kelly Wiley - please start PA for Repatha 140 mg q14d.  Once approved, will need to repeat labs after 4-6 doses.

## 2021-01-24 MED ORDER — REPATHA SURECLICK 140 MG/ML ~~LOC~~ SOAJ: 140.0000 mg | SUBCUTANEOUS | 11 refills | Status: DC

## 2021-01-24 NOTE — Telephone Encounter (Signed)
Called the pt to let them know that th repatha was approved, rx sent, instructed pt to call back if drug is cost prohibitive and to complete fasting lipids post 4th dose and the pt voiced understanding

## 2021-01-27 DIAGNOSIS — Z20822 Contact with and (suspected) exposure to covid-19: Secondary | ICD-10-CM | POA: Diagnosis not present

## 2021-02-02 NOTE — Progress Notes (Deleted)
Cardiology Office Note  Date: 02/02/2021   ID: Kelly Wiley, DOB February 17, 1956, MRN [id number]  PCP:  Rory Percy, MD  Cardiologist:  None Electrophysiologist:  None   Chief Complaint: Orthostatic hypotension / Syncope  History of Present Illness: Kelly Wiley is a 65 y.o. female with a history of HTN, DM2, Dysphagia, Fibromyalgia, GERD, HLD, cervical spine disease.  She was last seen by Dr. Bronson Ing on 07/09/2018 via telemedicine.  She described feeling worn out and tired.  Her blood pressure was elevated at home at 185/120.  She took her home medications and it came down to 154/100.  The prior night after taking 100 mg of losartan blood pressure dropped to 80/77.  She stated this was the first time this is happened in quite some time.  She had some chest heaviness with a pounding sensation.  She had palpitations for 3 to 4 days.  During telemedicine visit all of the symptoms had subsided.  Dr. Bronson Ing mentioned fluctuating blood pressure i was likely due to autonomic neuropathy.  He encouraged exercising to help regulate vascular tone.  He asked her to keep legs elevated to raise blood pressure when she noticed it dropping.  She was also encouraged to push fluid intake and was recommended compression stockings.  She had no recurrence of syncope since discontinuation of her diuretic.  Dr. Bronson Ing mentioned prior episodes possibly due to autonomic neuropathy given diabetes and hypotension which appeared to precipitate episodes.  He had previously informed her that opioid use for back pain may cause drops in blood pressure and she should lie down after taking these medications to avoid near syncope if blood pressure dropped. She had some chest heaviness and palpitations. The symptoms had occurred while BP was elevated but had since subsided. She was told to keep a symptoms diary to see if there was a correlation with her BP.   She was recently seen by her primary care provider on  11/16/2020 for hypertension.  She stated her blood pressure had been all over the place.  Lowest blood pressure was around 77/54 and highest was 248/103.  She described it as intermittent fluctuating blood pressure.  Did not seem to have a particular pattern.  The symptoms started approximately 4 months prior.  She states her losartan was discontinued by primary care provider and she was continuing carvedilol and amlodipine.  She also complains of palpitations slow/pounding heartbeat at times.  Complains of issues with low energy feeling as though she is give out at times.  She has diabetes and recent hemoglobin A1c was 7.3%.  Recent lipid level was 162 at PCP office.  She has statin intolerances.  Blood pressure today on arrival was 118/70.  She states she has not taken any blood pressure medications today.  We talked about initially stopping all antihypertensive medications and slowly adding based on blood pressure results.  She is in agreement.  Addendum dated 01/04/2021 from recent blood pressure list brought in by outpatient.  Had this conversation with Georgina Peer, RN. In reference to the blood pressure record she brought back to me earlier.  Tell her to start amlodipine at 5 mg daily.  Increase carvedilol to 6.25 mg p.o. twice daily.  In 2 weeks call us back with update on blood pressures.  Also she states she has not received her ZIO monitor.  Can you check on that please.  Thank you   Past Medical History:  Diagnosis Date   Cervical spine disease  DM (diabetes mellitus) ([place])    Dysphagia    Fibromyalgia    GERD (gastroesophageal reflux disease)    HTN (hypertension)    Hypercholesteremia    PONV (postoperative nausea and vomiting)    Seasonal allergies    Seizures ([place])    last one > 20 years ago    Past Surgical History:  Procedure Laterality Date   BACK SURGERY     CARPAL TUNNEL RELEASE  RIGHT   CERVICAL SPINE SURGERY  06/19/2010   COLONOSCOPY  2008 [place] [facility]    ESOPHAGOGASTRODUODENOSCOPY  01/24/2011   NWG:NFAOZHYQM 2o TO CERVICAL PLATES IN PT'S NECK AND MILD ESO MOTILITY DISORDER/Mild gastritis   ESOPHAGOGASTRODUODENOSCOPY  03/03/2011   VHQ:IONG gastritis    FLEXIBLE SIGMOIDOSCOPY  01/24/2011   SLF: Internal Hemorrhoids   SAVORY DILATION  01/24/2011   Procedure: SAVORY DILATION;  Surgeon: Dorothyann Peng, MD;  Location: [facility] ENDO SUITE;  Service: Endoscopy;  Laterality: N/A;   SHOULDER SURGERY  RIGHT   TUBAL LIGATION      Current Outpatient Medications  Medication Sig Dispense Refill   amLODipine (NORVASC) 5 MG tablet Take 1 tablet (5 mg total) by mouth daily. 90 tablet 1   amoxicillin-clavulanate (AUGMENTIN) 875-125 MG tablet Take 1 tablet by mouth 2 (two) times daily.     aspirin-acetaminophen-caffeine (EXCEDRIN MIGRAINE) 250-250-65 MG per tablet Take 1 tablet by mouth every 6 (six) hours as needed. headache      carvedilol (COREG) 6.25 MG tablet Take 1 tablet (6.25 mg total) by mouth 2 (two) times daily. 180 tablet 1   cholecalciferol (VITAMIN D3) 25 MCG (1000 UNIT) tablet Take 1,000 Units by mouth daily. (Patient not taking: Reported on 01/19/2021)     clonazePAM (KLONOPIN) 0.5 MG tablet Take 0.5 mg by mouth daily as needed.     Evolocumab (REPATHA SURECLICK) 295 MG/ML SOAJ Inject 140 mg into the skin every 14 (fourteen) days. 2 mL 11   Insulin NPH Human, Isophane, (NOVOLIN N FLEXPEN [place]) Inject into the skin.     insulin regular (NOVOLIN R,HUMULIN R) 100 units/mL injection Inject into the skin 2 (two) times daily. Sliding scale     loratadine (CLARITIN) 10 MG tablet Take 10 mg by mouth daily as needed. For allergies     losartan (COZAAR) 100 MG tablet Take 100 mg by mouth daily. (Patient not taking: Reported on 01/19/2021)     pantoprazole (PROTONIX) 40 MG tablet Take 1 tablet by mouth 2 (two) times daily.      pramipexole (MIRAPEX) 1 MG tablet Take 1 tablet by mouth 2 (two) times daily. (Patient not taking: No sig reported)     TYMLOS 3120  MCG/1.56ML SOPN Inject into the skin.     Vitamin D, Ergocalciferol, (DRISDOL) 1.25 MG (50000 UNIT) CAPS capsule Take 50,000 Units by mouth 2 (two) times a week.     No current facility-administered medications for this visit.   Allergies:  Ativan [lorazepam], Ethosuximide, Iodine 131, Phenytoin sodium extended, and Sulfa antibiotics   Social History: The patient  reports that she quit smoking about 52 years ago. Her smoking use included cigarettes. She started smoking about 54 years ago. She has a 1.00 pack-year smoking history. She has never used smokeless tobacco. She reports current alcohol use. She reports that she does not use drugs.   Family History: The patient's family history includes Congenital heart disease in her son; Diabetes in her son; Heart disease in her mother; Lung cancer in her mother.  ROS:  Please see the history of present illness. Otherwise, complete review of systems is positive for none.  All other systems are reviewed and negative.   Physical Exam: VS:  There were no vitals taken for this visit., BMI There is no height or weight on file to calculate BMI.  Wt Readings from Last 3 Encounters:  01/19/21 192 lb (87.1 kg)  12/17/20 187 lb (84.8 kg)  07/09/18 190 lb (86.2 kg)    General: Obese patient appears comfortable at rest. Neck: Supple, no elevated JVP or carotid bruits, no thyromegaly. Lungs: Clear to auscultation, nonlabored breathing at rest. Cardiac: Regular rate and rhythm, no S3 or significant systolic murmur, no pericardial rub. Extremities: No pitting edema, distal pulses 2+. Skin: Warm and dry. Musculoskeletal: No kyphosis. Neuropsychiatric: Alert and oriented x3, affect grossly appropriate.  ECG: EKG today normal sinus rhythm rate of 67  Recent Labwork: No results found for requested labs within last 8760 hours.  No results found for: CHOL, TRIG, HDL, CHOLHDL, VLDL, LDLCALC, LDLDIRECT  Other Studies Reviewed Today:   Assessment and  Plan:  1. Fluctuating blood pressure   2. Palpitations   3. Mixed hyperlipidemia   4. Type 2 diabetes mellitus without complication, with long-term current use of insulin ([place])     1. Fluctuating blood pressure Patient states she is having issues with fluctuating blood pressures.  She was previously on losartan, carvedilol, amlodipine.  Her PCP had recently stopped her losartan and left her on carvedilol and amlodipine.  She states she still has some issues with fluctuating blood pressures.  Recent PCP visit on 11/16/2020 she stated her blood pressures had been all over the place with lowest blood pressure around 77/54 and highest blood pressure at 248/103.  Stated the symptoms started 4 months prior. She stated this occurred daily.  She states she has not taken any of her blood pressure medications today.  Blood pressure today is 118/70.  I asked her to stop all antihypertensive medications for 1 week and check her blood pressures and update [place] in 1 week.  Will add medications back one by one based on blood pressure measurements.  She states she was recently sent to Dr. Theador Hawthorne nephrology for blood pressure issues by PCP.  2. Palpitations Complains of palpitations with bounding heartbeats at times.  States she feels this nightly sometimes with an occasional heart beating.  Please get a 14-day ZIO monitor to assess for arrhythmias.   3. Mixed hyperlipidemia  Most recent LDL was 162.  She has a history of diabetes and needs to be antilipid medication PCSK9 inhibitor possibly.  Please use referral to lipid clinic.   4.  Diabetes History of diabetes.  Most recent hemoglobin A1c was 7.3%.     Medication Adjustments/Labs and Tests Ordered: Current medicines are reviewed at length with the patient today.  Concerns regarding medicines are outlined above.   Disposition: Follow-up with Dr. Harl Bowie or [facility] 8 weeks.  Signed, Levell July, NP 02/02/2021 8:00 PM    [facility]  at [place], [place], [place] [address] Phone: [phone]; Fax: [phone]

## 2021-02-03 ENCOUNTER — Ambulatory Visit: Payer: Medicare Other | Admitting: Family Medicine

## 2021-02-03 DIAGNOSIS — R002 Palpitations: Secondary | ICD-10-CM

## 2021-02-03 DIAGNOSIS — I998 Other disorder of circulatory system: Secondary | ICD-10-CM

## 2021-02-03 DIAGNOSIS — E782 Mixed hyperlipidemia: Secondary | ICD-10-CM

## 2021-02-03 DIAGNOSIS — E119 Type 2 diabetes mellitus without complications: Secondary | ICD-10-CM

## 2021-02-08 DIAGNOSIS — R002 Palpitations: Secondary | ICD-10-CM | POA: Diagnosis not present

## 2021-02-08 DIAGNOSIS — H43392 Other vitreous opacities, left eye: Secondary | ICD-10-CM | POA: Diagnosis not present

## 2021-02-09 DIAGNOSIS — M25561 Pain in right knee: Secondary | ICD-10-CM | POA: Diagnosis not present

## 2021-02-09 DIAGNOSIS — R300 Dysuria: Secondary | ICD-10-CM | POA: Diagnosis not present

## 2021-02-15 ENCOUNTER — Ambulatory Visit: Payer: Medicare Other | Admitting: Family Medicine

## 2021-02-16 NOTE — Progress Notes (Signed)
Cardiology Office Note    Date:  02/23/2021   ID:  Kelly Wiley, DOB October 17, 1955, MRN [id number]   PCP:  Carroll, [place], [place]  Cardiologist:  None   Advanced Practice Provider:  No care team member to display Electrophysiologist:  None   [id number]}   Chief Complaint  Patient presents with   Follow-up     History of Present Illness:  Kelly Wiley is a 65 y.o. female with history of labile HTN, DM2, HLD statin intolerant, GERD, cervical spine disease, fibromyalgia.  Patient saw Mr. Leonides Sake 12/17/20 and after bringing in BP readings he started amlodipine 5 mg and increased coreg 6.25 mg bid. Was also having palpitations and zio monitor ordered-NSR 4 SVT fastest 9 beats longest 17 beats, ventricular bigeminy and trigeminy.  Patient comes in for f/u. BP up and down. She can't take 2 amlodipine because her BP gets too low. Still having some fast heart rates-feels in her head-lasts an hour sometimes and very uncomfortable. Hasn't been taking carvedilol or amlodipine on a regular basis- BP sometimes 95'G systolic and at times 387'F. Not active in past 6 months because she doesn't feel good.    Past Medical History:  Diagnosis Date   Cervical spine disease    DM (diabetes mellitus) ([place])    Dysphagia    Fibromyalgia    GERD (gastroesophageal reflux disease)    HTN (hypertension)    Hypercholesteremia    PONV (postoperative nausea and vomiting)    Seasonal allergies    Seizures (HCC)    last one > 20 years ago    Past Surgical History:  Procedure Laterality Date   BACK SURGERY     CARPAL TUNNEL RELEASE  RIGHT   CERVICAL SPINE SURGERY  06/19/2010   COLONOSCOPY  2008 [place]   ESOPHAGOGASTRODUODENOSCOPY  01/24/2011   IEP:PIRJJOACZ 2o TO CERVICAL PLATES IN PT'S NECK AND MILD ESO MOTILITY DISORDER/Mild gastritis   ESOPHAGOGASTRODUODENOSCOPY  03/03/2011   YSA:YTKZ gastritis    FLEXIBLE SIGMOIDOSCOPY  01/24/2011   SLF:  Internal Hemorrhoids   SAVORY DILATION  01/24/2011   Procedure: SAVORY DILATION;  Surgeon: Dorothyann Peng, MD;  [facility];  Service: Endoscopy;  Laterality: N/A;   SHOULDER SURGERY  RIGHT   TUBAL LIGATION      Current Medications: Current Meds  Medication Sig   aspirin-acetaminophen-caffeine (EXCEDRIN MIGRAINE) 250-250-65 MG per tablet Take 1 tablet by mouth every 6 (six) hours as needed. headache    clonazePAM (KLONOPIN) 0.5 MG tablet Take 0.5 mg by mouth daily as needed.   diclofenac (VOLTAREN) 50 MG EC tablet Take 50 mg by mouth 2 (two) times daily.   Evolocumab (REPATHA SURECLICK) 601 MG/ML SOAJ Inject 140 mg into the skin every 14 (fourteen) days.   Insulin NPH Human, Isophane, (NOVOLIN N FLEXPEN ) Inject into the skin.   insulin regular (NOVOLIN R,HUMULIN R) 100 units/mL injection Inject into the skin 2 (two) times daily. Sliding scale   loratadine (CLARITIN) 10 MG tablet Take 10 mg by mouth daily as needed. For allergies   pantoprazole (PROTONIX) 40 MG tablet Take 1 tablet by mouth 2 (two) times daily.    pramipexole (MIRAPEX) 1 MG tablet Take 1 tablet by mouth 2 (two) times daily.   TYMLOS 3120 MCG/1.56ML SOPN Inject into the skin.   Vitamin D, Ergocalciferol, (DRISDOL) 1.25 MG (50000 UNIT) CAPS capsule Take 50,000 Units by mouth 2 (two) times a  week.   [DISCONTINUED] amLODipine (NORVASC) 5 MG tablet Take 1 tablet (5 mg total) by mouth daily.   [DISCONTINUED] carvedilol (COREG) 6.25 MG tablet Take 1 tablet (6.25 mg total) by mouth 2 (two) times daily.     Allergies:   Ativan [lorazepam], Ethosuximide, Iodine 131, Phenytoin sodium extended, and Sulfa antibiotics   Social History   Socioeconomic History   Marital status: Widowed    Spouse name: Not on file   Number of children: Not on file   Years of education: Not on file   Highest education level: Not on file  Occupational History   Not on file  Tobacco Use   Smoking status: Former    Packs/day: 0.50     Years: 2.00    Pack years: 1.00    Types: Cigarettes    Start date: 10/12/1966    Quit date: 10/11/1968    Years since quitting: 52.4   Smokeless tobacco: Never  Vaping Use   Vaping Use: Never used  Substance and Sexual Activity   Alcohol use: Yes    Alcohol/week: 0.0 standard drinks    Comment: occasionally   Drug use: No   Sexual activity: Not on file  Other Topics Concern   Not on file  Social History Narrative   MARRIED, 2 KIDS, [place] 82 YO.   Son has DM, malabsorption   Works in Press photographer in [place]   EtOH: rare   No tobacco products   Social Determinants of Radio broadcast assistant Strain: Not on file  Food Insecurity: Not on file  Transportation Needs: Not on file  Physical Activity: Not on file  Stress: Not on file  Social Connections: Not on file     Family History:  The patient's  family history includes Congenital heart disease in her son; Diabetes in her son; Heart disease in her mother; Lung cancer in her mother.   ROS:   Please see the history of present illness.    ROS All other systems reviewed and are negative.   PHYSICAL EXAM:   VS:  BP 136/82   Pulse 62   Ht 5\' 2"  (1.575 m)   Wt 193 lb (87.5 kg)   SpO2 95%   BMI 35.30 kg/m   Physical Exam  GEN: Obese, in no acute distress  Neck: no JVD, carotid bruits, or masses Cardiac:RRR; no murmurs, rubs, or gallops  Respiratory:  clear to auscultation bilaterally, normal work of breathing GI: soft, nontender, nondistended, + BS Ext: without cyanosis, clubbing, or edema, Good distal pulses bilaterally Neuro:  Alert and Oriented x 3 Psych: euthymic mood, full affect  Wt Readings from Last 3 Encounters:  02/23/21 193 lb (87.5 kg)  01/19/21 192 lb (87.1 kg)  12/17/20 187 lb (84.8 kg)      Studies/Labs Reviewed:   EKG:  EKG is not ordered today.    Recent Labs: No results found for requested labs within last 8760 hours.   Lipid Panel No results found for: CHOL, TRIG, HDL, CHOLHDL,  VLDL, LDLCALC, LDLDIRECT  Additional studies/ records that were reviewed today include:  Zio monitor 02/08/21  Patch Wear Time:  10 days and 6 hours (2022-10-26T08:54:34-0400 to 2022-11-05T15:29:03-398)   Patient had a min HR of 60 bpm, max HR of 218 bpm, and avg HR of 83 bpm. Predominant underlying rhythm was Sinus Rhythm. 4 Supraventricular Tachycardia runs occurred, the run with the fastest interval lasting 9 beats with a max rate of 218 bpm, the  longest lasting 17  beats with an avg rate of 174 bpm. Isolated SVEs were rare (<1.0%), SVE Couplets were rare (<1.0%), and SVE Triplets were rare (<1.0%). Isolated VEs were occasional (1.0%, 12256), VE Couplets were rare (<1.0%, 2), and no VE Triplets  were present. Ventricular Bigeminy and Trigeminy were present.   Echo 2017 mild LVH EF 60-65%  Risk Assessment/Calculations:         ASSESSMENT:    1. Labile hypertension   2. SVT (supraventricular tachycardia) (HCC)   3. Other hyperlipidemia   4. Type 2 diabetes mellitus without complication, with long-term current use of insulin (HCC)   5. Obesity (BMI 30-39.9)      PLAN:  In order of problems listed above:  Labile HTN blood pressure up and down and patient only taking amlodipine 1 its up.  She has not taken carvedilol regularly either.  No regular exercise.  Thyroid has not been checked in about 2 years.  We will stop amlodipine and asked her to try carvedilol 6.25 mg twice daily because of her SVT.  SVT on recent Zio and bigeminy and trigeminy I have asked her to take carvedilol 6.25 mg twice daily to see if this will control her blood pressure and her SVT.  She will let us know in the next 2 weeks.  HLD-statin intolerant to start Repatha  DM2 A1c came down to 7 managed by PCP  Obesity Exercise and weight loss recommended  Shared Decision Making/Informed Consent        Medication Adjustments/Labs and Tests Ordered: Current medicines are reviewed at length with the  patient today.  Concerns regarding medicines are outlined above.  Medication changes, Labs and Tests ordered today are listed in the Patient Instructions below. Patient Instructions  Medication Instructions:  Stop Amlodipine (Norvasc) Continue your Coreg at 6.25mg  twice a day   Continue all other medications.     Labwork: Have Dr. Nadara Mustard add TSH (thyroid) lab to labs that you are doing in January.  Testing/Procedures: none  Follow-Up: 2-3 months   Any Other Special Instructions Will Be Listed Below (If Applicable). Please keep log of blood pressure readings x 2 weeks & call the office with update  If you need a refill on your cardiac medications before your next appointment, please call your pharmacy.   Sumner Boast, PA-C  02/23/2021 12:35 PM    [facility] [address], [place], [place]  [address] Phone: [phone]; Fax: [phone]

## 2021-02-21 DIAGNOSIS — M25561 Pain in right knee: Secondary | ICD-10-CM | POA: Diagnosis not present

## 2021-02-23 ENCOUNTER — Ambulatory Visit (INDEPENDENT_AMBULATORY_CARE_PROVIDER_SITE_OTHER): Payer: Medicare Other | Admitting: Physician Assistant

## 2021-02-23 ENCOUNTER — Encounter: Payer: Self-pay | Admitting: Physician Assistant

## 2021-02-23 ENCOUNTER — Other Ambulatory Visit: Payer: Self-pay

## 2021-02-23 VITALS — BP 136/82 | HR 62 | Ht 62.0 in | Wt 193.0 lb

## 2021-02-23 DIAGNOSIS — E7849 Other hyperlipidemia: Secondary | ICD-10-CM

## 2021-02-23 DIAGNOSIS — R0989 Other specified symptoms and signs involving the circulatory and respiratory systems: Secondary | ICD-10-CM | POA: Diagnosis not present

## 2021-02-23 DIAGNOSIS — E669 Obesity, unspecified: Secondary | ICD-10-CM | POA: Diagnosis not present

## 2021-02-23 DIAGNOSIS — I471 Supraventricular tachycardia: Secondary | ICD-10-CM | POA: Diagnosis not present

## 2021-02-23 DIAGNOSIS — Z794 Long term (current) use of insulin: Secondary | ICD-10-CM | POA: Diagnosis not present

## 2021-02-23 DIAGNOSIS — E119 Type 2 diabetes mellitus without complications: Secondary | ICD-10-CM | POA: Diagnosis not present

## 2021-02-23 MED ORDER — CARVEDILOL 6.25 MG PO TABS: 6.2500 mg | ORAL_TABLET | Freq: Two times a day (BID) | ORAL | 1 refills | Status: DC

## 2021-02-23 NOTE — Patient Instructions (Addendum)
Medication Instructions:  Stop Amlodipine (Norvasc) Continue your Coreg at 6.25mg  twice a day   Continue all other medications.     Labwork: Have Dr. Nadara Mustard add TSH (thyroid) lab to labs that you are doing in January.  Testing/Procedures: none  Follow-Up: 2-3 months   Any Other Special Instructions Will Be Listed Below (If Applicable). Please keep log of blood pressure readings x 2 weeks & call the office with update  If you need a refill on your cardiac medications before your next appointment, please call your pharmacy.

## 2021-02-28 DIAGNOSIS — M1711 Unilateral primary osteoarthritis, right knee: Secondary | ICD-10-CM | POA: Diagnosis not present

## 2021-03-09 DIAGNOSIS — S83241A Other tear of medial meniscus, current injury, right knee, initial encounter: Secondary | ICD-10-CM | POA: Diagnosis not present

## 2021-03-09 DIAGNOSIS — M25561 Pain in right knee: Secondary | ICD-10-CM | POA: Diagnosis not present

## 2021-03-14 DIAGNOSIS — I10 Essential (primary) hypertension: Secondary | ICD-10-CM | POA: Diagnosis not present

## 2021-03-14 DIAGNOSIS — Z6833 Body mass index (BMI) 33.0-33.9, adult: Secondary | ICD-10-CM | POA: Diagnosis not present

## 2021-03-14 DIAGNOSIS — E1143 Type 2 diabetes mellitus with diabetic autonomic (poly)neuropathy: Secondary | ICD-10-CM | POA: Diagnosis not present

## 2021-03-14 DIAGNOSIS — R300 Dysuria: Secondary | ICD-10-CM | POA: Diagnosis not present

## 2021-03-15 ENCOUNTER — Telehealth: Payer: Self-pay

## 2021-03-15 MED ORDER — CARVEDILOL 12.5 MG PO TABS: 12.5000 mg | ORAL_TABLET | Freq: Two times a day (BID) | ORAL | 3 refills | Status: DC

## 2021-03-15 NOTE — Telephone Encounter (Signed)
I called patient to give her he monitor results. She states that since her office visit on 02/23/21 when her amlodipine was stopped, her BP's are rising. She states she is running 150/100's    I will message M.Lenze, PA-C for advice

## 2021-03-15 NOTE — Telephone Encounter (Signed)
-----   Message from Satira Sark, MD sent at 03/14/2021  9:07 AM EST ----- Cardiac monitor ordered by Mr. Leonides Sake NP.  Subsequent visit noted with Ms. Bonnell Public PA-C.  I see that the patient is scheduled to follow-up with Dr. Radford Pax in March.  Monitor obtained for palpitations.  Rhythm is sinus with occasional PVCs representing 1% total beats and rare PACs.  Also a few episodes of PSVT, the longest of which was 17 beats.  Would plan to continue current medical therapy (on Coreg) and keep follow-up as scheduled.

## 2021-03-15 NOTE — Telephone Encounter (Signed)
Patient agrees to increase coreg to 12.5 mg bid and will monitor bp's

## 2021-03-18 DIAGNOSIS — M1711 Unilateral primary osteoarthritis, right knee: Secondary | ICD-10-CM | POA: Diagnosis not present

## 2021-03-25 DIAGNOSIS — M1711 Unilateral primary osteoarthritis, right knee: Secondary | ICD-10-CM | POA: Diagnosis not present

## 2021-03-28 DIAGNOSIS — Z23 Encounter for immunization: Secondary | ICD-10-CM | POA: Diagnosis not present

## 2021-03-28 DIAGNOSIS — E1143 Type 2 diabetes mellitus with diabetic autonomic (poly)neuropathy: Secondary | ICD-10-CM | POA: Diagnosis not present

## 2021-03-28 DIAGNOSIS — Z13220 Encounter for screening for lipoid disorders: Secondary | ICD-10-CM | POA: Diagnosis not present

## 2021-03-28 DIAGNOSIS — E039 Hypothyroidism, unspecified: Secondary | ICD-10-CM | POA: Diagnosis not present

## 2021-03-30 DIAGNOSIS — R300 Dysuria: Secondary | ICD-10-CM | POA: Diagnosis not present

## 2021-03-30 DIAGNOSIS — E7849 Other hyperlipidemia: Secondary | ICD-10-CM | POA: Diagnosis not present

## 2021-03-30 DIAGNOSIS — Z6833 Body mass index (BMI) 33.0-33.9, adult: Secondary | ICD-10-CM | POA: Diagnosis not present

## 2021-03-30 DIAGNOSIS — G720 Drug-induced myopathy: Secondary | ICD-10-CM | POA: Diagnosis not present

## 2021-04-01 DIAGNOSIS — M1711 Unilateral primary osteoarthritis, right knee: Secondary | ICD-10-CM | POA: Diagnosis not present

## 2021-04-11 DIAGNOSIS — Z6834 Body mass index (BMI) 34.0-34.9, adult: Secondary | ICD-10-CM | POA: Diagnosis not present

## 2021-04-11 DIAGNOSIS — E1165 Type 2 diabetes mellitus with hyperglycemia: Secondary | ICD-10-CM | POA: Diagnosis not present

## 2021-04-11 DIAGNOSIS — G4700 Insomnia, unspecified: Secondary | ICD-10-CM | POA: Diagnosis not present

## 2021-04-13 ENCOUNTER — Telehealth: Payer: Self-pay | Admitting: Physician Assistant

## 2021-04-13 MED ORDER — CARVEDILOL 12.5 MG PO TABS: 12.5000 mg | ORAL_TABLET | Freq: Two times a day (BID) | ORAL | 0 refills | Status: DC

## 2021-04-13 NOTE — Telephone Encounter (Signed)
Refill sent to [facility]

## 2021-04-13 NOTE — Telephone Encounter (Signed)
°*  STAT* If patient is at the pharmacy, call can be transferred to refill team.   1. Which medications need to be refilled? (please list name of each medication and dose if known) new prescription for Carvedilol  2. Which pharmacy/location (including street and city if local pharmacy) is medication to be sent to?[facility], [place]  3. Do they need a 30 day or 90 day supply? 30 days and refills

## 2021-04-25 DIAGNOSIS — Z20822 Contact with and (suspected) exposure to covid-19: Secondary | ICD-10-CM | POA: Diagnosis not present

## 2021-04-29 DIAGNOSIS — M1711 Unilateral primary osteoarthritis, right knee: Secondary | ICD-10-CM | POA: Diagnosis not present

## 2021-05-03 DIAGNOSIS — H35031 Hypertensive retinopathy, right eye: Secondary | ICD-10-CM | POA: Diagnosis not present

## 2021-05-10 DIAGNOSIS — M1711 Unilateral primary osteoarthritis, right knee: Secondary | ICD-10-CM | POA: Diagnosis not present

## 2021-05-30 DIAGNOSIS — M791 Myalgia, unspecified site: Secondary | ICD-10-CM | POA: Diagnosis not present

## 2021-05-30 DIAGNOSIS — E1165 Type 2 diabetes mellitus with hyperglycemia: Secondary | ICD-10-CM | POA: Diagnosis not present

## 2021-05-30 DIAGNOSIS — M2550 Pain in unspecified joint: Secondary | ICD-10-CM | POA: Diagnosis not present

## 2021-05-30 DIAGNOSIS — Z6833 Body mass index (BMI) 33.0-33.9, adult: Secondary | ICD-10-CM | POA: Diagnosis not present

## 2021-05-30 DIAGNOSIS — R5383 Other fatigue: Secondary | ICD-10-CM | POA: Diagnosis not present

## 2021-06-01 ENCOUNTER — Encounter: Payer: Self-pay | Admitting: Cardiology

## 2021-06-01 ENCOUNTER — Other Ambulatory Visit: Payer: Self-pay

## 2021-06-01 ENCOUNTER — Telehealth: Payer: Self-pay

## 2021-06-01 ENCOUNTER — Ambulatory Visit (INDEPENDENT_AMBULATORY_CARE_PROVIDER_SITE_OTHER): Payer: Medicare Other | Admitting: Cardiology

## 2021-06-01 ENCOUNTER — Other Ambulatory Visit (HOSPITAL_COMMUNITY)
Admission: RE | Admit: 2021-06-01 | Discharge: 2021-06-01 | Disposition: A | Discharge status: Home / Self Care | Payer: Medicare Other | Source: Ambulatory Visit | Attending: Cardiology | Admitting: Cardiology

## 2021-06-01 VITALS — BP 152/104 | HR 67 | Ht 62.0 in | Wt 190.6 lb

## 2021-06-01 DIAGNOSIS — E669 Obesity, unspecified: Secondary | ICD-10-CM | POA: Diagnosis not present

## 2021-06-01 DIAGNOSIS — I10 Essential (primary) hypertension: Secondary | ICD-10-CM | POA: Diagnosis not present

## 2021-06-01 DIAGNOSIS — E7800 Pure hypercholesterolemia, unspecified: Secondary | ICD-10-CM | POA: Diagnosis not present

## 2021-06-01 DIAGNOSIS — E119 Type 2 diabetes mellitus without complications: Secondary | ICD-10-CM

## 2021-06-01 DIAGNOSIS — I471 Supraventricular tachycardia: Secondary | ICD-10-CM

## 2021-06-01 DIAGNOSIS — H93A9 Pulsatile tinnitus, unspecified ear: Secondary | ICD-10-CM

## 2021-06-01 DIAGNOSIS — R519 Headache, unspecified: Secondary | ICD-10-CM | POA: Diagnosis not present

## 2021-06-01 DIAGNOSIS — Z794 Long term (current) use of insulin: Secondary | ICD-10-CM | POA: Diagnosis not present

## 2021-06-01 LAB — BASIC METABOLIC PANEL
Anion gap: 10 (ref 5–15)
BUN: 12 mg/dL (ref 8–23)
CO2: 24 mmol/L (ref 22–32)
Calcium: 9 mg/dL (ref 8.9–10.3)
Chloride: 107 mmol/L (ref 98–111)
Creatinine, Ser: 0.72 mg/dL (ref 0.44–1.00)
GFR, Estimated: >60 mL/min (ref >60)
Glucose, Bld: 149 mg/dL — ABNORMAL HIGH (ref 70–99)
Potassium: 3.3 mmol/L — ABNORMAL LOW (ref 3.5–5.1)
Sodium: 141 mmol/L (ref 135–145)

## 2021-06-01 MED ORDER — POTASSIUM CHLORIDE CRYS ER 20 MEQ PO TBCR: 40.0000 meq | EXTENDED_RELEASE_TABLET | Freq: Once | ORAL | 0 refills | Status: DC

## 2021-06-01 NOTE — Progress Notes (Signed)
? ?Cardiology Office Note   ? ?Date:  06/01/2021  ? ?ID:  Kelly Wiley, DOB Jan 15, 1956, MRN [id number] ? ? ?PCP:  Lanelle Bal, PA-C ?  ?[place]  ?Cardiologist:  None   ?Advanced Practice Provider:  No care team member to display ?Electrophysiologist:  None  ? ?CC:  SVT and HTN ? ?History of Present Illness:  ?Kelly Wiley is a 66 y.o. female with history of labile HTN, DM2, HLD statin intolerant, GERD, cervical spine disease, fibromyalgia. She also has a hx of palpitations and zio monitor was ordered showing NSR with 4 episodes of SVT fastest 9 beats longest 17 beats, ventricular bigeminy and trigeminy.  Treatment for palpitations has been limited due to episodes of soft BP. ? ?She is here today for followup and is doing well.  She denies any chest pain or pressure, SOB, DOE, PND, orthopnea, LE edema, dizziness or syncope. She still has some palpitations from time to time but not a lot.  She says at times her BP will be soft.  She also notices that when she is quiet sitting at night she becomes very aware of her heart rate.  It is not fast but is a stronger heart beat and will feel it down her spine and neck and will get headaches.  She is compliant with her meds and is tolerating meds with no SE.    ? ? ?Past Medical History:  ?Diagnosis Date  ? Cervical spine disease   ? DM (diabetes mellitus) ([place])   ? Dysphagia   ? Fibromyalgia   ? GERD (gastroesophageal reflux disease)   ? HTN (hypertension)   ? Hypercholesteremia   ? PONV (postoperative nausea and vomiting)   ? Seasonal allergies   ? Seizures ([place])   ? last one > 20 years ago  ? ? ?Past Surgical History:  ?Procedure Laterality Date  ? BACK SURGERY    ? CARPAL TUNNEL RELEASE  RIGHT  ? CERVICAL SPINE SURGERY  06/19/2010  ? COLONOSCOPY  2008 [place]   ? [place] [facility]  ? ESOPHAGOGASTRODUODENOSCOPY  01/24/2011  ? PJA:[id number] 2o TO CERVICAL PLATES IN PT'S NECK AND MILD ESO MOTILITY DISORDER/Mild gastritis  ? ESOPHAGOGASTRODUODENOSCOPY   03/03/2011  ? HAL:PFXT gastritis   ? FLEXIBLE SIGMOIDOSCOPY  01/24/2011  ? SLF: Internal Hemorrhoids  ? SAVORY DILATION  01/24/2011  ? Procedure: SAVORY DILATION;  Surgeon: Dorothyann Peng, MD;  [facility];  Service: Endoscopy;  Laterality: N/A;  ? SHOULDER SURGERY  RIGHT  ? TUBAL LIGATION    ? ? ?Current Medications: ?Current Meds  ?Medication Sig  ? amitriptyline (ELAVIL) 25 MG tablet Take 12.5 mg by mouth at bedtime.  ? aspirin-acetaminophen-caffeine (EXCEDRIN MIGRAINE) 250-250-65 MG per tablet Take 1 tablet by mouth every 6 (six) hours as needed. headache   ? carvedilol (COREG) 12.5 MG tablet Take 1 tablet (12.5 mg total) by mouth 2 (two) times daily.  ? diclofenac (VOLTAREN) 75 MG EC tablet Take 75 mg by mouth daily.  ? ezetimibe (ZETIA) 10 MG tablet Take 10 mg by mouth daily.  ? HUMALOG KWIKPEN 100 UNIT/ML KwikPen Inject into the skin.  ? loratadine (CLARITIN) 10 MG tablet Take 10 mg by mouth daily as needed. For allergies  ? nitrofurantoin, macrocrystal-monohydrate, (MACROBID) 100 MG capsule Take 100 mg by mouth daily.  ? pantoprazole (PROTONIX) 40 MG tablet Take 1 tablet by mouth 2 (two) times daily.   ? SEMGLEE, YFGN, 100 UNIT/ML Pen Inject into the skin.  ?  TYMLOS 3120 MCG/1.56ML SOPN Inject into the skin.  ? Vitamin D, Ergocalciferol, (DRISDOL) 1.25 MG (50000 UNIT) CAPS capsule Take 50,000 Units by mouth 2 (two) times a week.  ?  ? ?Allergies:   Ativan [lorazepam], Ethosuximide, Iodine 131, Phenytoin sodium extended, and Sulfa antibiotics  ? ?Social History  ? ?Socioeconomic History  ? Marital status: Widowed  ?  Spouse name: Not on file  ? Number of children: Not on file  ? Years of education: Not on file  ? Highest education level: Not on file  ?Occupational History  ? Not on file  ?Tobacco Use  ? Smoking status: Former  ?  Packs/day: 0.50  ?  Years: 2.00  ?  Pack years: 1.00  ?  Types: Cigarettes  ?  Start date: 10/12/1966  ?  Quit date: 10/11/1968  ?  Years since quitting: 52.6  ? Smokeless  tobacco: Never  ?Vaping Use  ? Vaping Use: Never used  ?Substance and Sexual Activity  ? Alcohol use: Yes  ?  Alcohol/week: 0.0 standard drinks  ?  Comment: occasionally  ? Drug use: No  ? Sexual activity: Not on file  ?Other Topics Concern  ? Not on file  ?Social History Narrative  ? MARRIED, 2 KIDS, YOUNGEST 31 YO.  ? Son has DM, malabsorption  ? 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 EtOH: rare  ? No tobacco products  ? ?Social Determinants of Health  ? ?Financial Resource Strain: Not on file  ?Food Insecurity: Not on file  ?Transportation Needs: Not on file  ?Physical Activity: Not on file  ?Stress: Not on file  ?Social Connections: Not on file  ?  ? ?Family History:  The patient's  family history includes Congenital heart disease in her son; Diabetes in her son; Heart disease in her mother; Lung cancer in her mother.  ? ?ROS:   ?Please see the history of present illness.    ?ROS All other systems reviewed and are negative. ? ? ?PHYSICAL EXAM:   ?VS:  BP (!) 152/104   Pulse 67   Ht '5\' 2"'$  (1.575 m)   Wt 190 lb 9.6 oz (86.5 kg)   SpO2 98%   BMI 34.86 kg/m?   ?Physical Exam  ?GEN: Well nourished, well developed in no acute distress ?HEENT: Normal ?NECK: No JVD; No carotid bruits ?LYMPHATICS: No lymphadenopathy ?CARDIAC:RRR, no murmurs, rubs, gallops ?RESPIRATORY:  Clear to auscultation without rales, wheezing or rhonchi  ?ABDOMEN: Soft, non-tender, non-distended ?MUSCULOSKELETAL:  No edema; No deformity  ?SKIN: Warm and dry ?NEUROLOGIC:  Alert and oriented x 3 ?PSYCHIATRIC:  Normal affect   ?Wt Readings from Last 3 Encounters:  ?06/01/21 190 lb 9.6 oz (86.5 kg)  ?02/23/21 193 lb (87.5 kg)  ?01/19/21 192 lb (87.1 kg)  ?  ? ? ?Studies/Labs Reviewed:  ? ?EKG:  EKG is not ordered today  ? ?Recent Labs: ?No results found for requested labs within last 8760 hours.  ? ?Lipid Panel ?No results found for: CHOL, TRIG, HDL, CHOLHDL, VLDL, LDLCALC, LDLDIRECT ? ?Additional studies/ records that were reviewed today include:   ?Zio monitor 02/08/21 ? ?Patch Wear Time:  10 days and 6 hours (2022-10-26T08:54:34-0400 to 2022-11-05T15:29:03-398) ?  ?Patient had a min HR of 60 bpm, max HR of 218 bpm, and avg HR of 83 bpm. Predominant underlying rhythm was Sinus Rhythm. 4 Supraventricular Tachycardia runs occurred, the run with the fastest interval lasting 9 beats with a max rate of 218 bpm, the  ?longest lasting 17  beats with an avg rate of 174 bpm. Isolated SVEs were rare (<1.0%), SVE Couplets were rare (<1.0%), and SVE Triplets were rare (<1.0%). Isolated VEs were occasional (1.0%, 12256), VE Couplets were rare (<1.0%, 2), and no VE Triplets  ?were present. Ventricular Bigeminy and Trigeminy were present.  ? ?Echo 2017 mild LVH EF 60-65% ? ?  ?ASSESSMENT:   ? ?1. Essential hypertension   ?2. SVT (supraventricular tachycardia) ([place])   ?3. Pure hypercholesterolemia   ?4. Type 2 diabetes mellitus without complication, with long-term current use of insulin ([place])   ?5. Obesity (BMI 30-39.9)   ? ? ? ?PLAN:  ?In order of problems listed above: ? ?Labile HTN  ?-BP is elevated on exam today ?-she says that occasionally has drops in her BP to 80/60's and she will have to sit down and prop up her legs   ?-Continue prescription drug management carvedilol 12.5 mg twice daily with as needed refills ?-I encouraged her to wear compression hose during the day>>I will give her a Rx to get them properly fitted ?-Recommend a 48 hour BP monitor to assess when her BP is high and low ? ?SVT  ?-Diagnosed on Zio patch.  ?-Palpitations have been well controlled on beta-blocker ?-Continue prescription drug management with carvedilol 12.5 mg twice daily ? ?HLD ?-statin intolerant  ?-Followed by PCP ?-Repatha was ordered but she cannot afford it ?-I will get her back in to lipid clinic ?-I have personally reviewed and interpreted outside labs performed by patient's PCP which showed LDL 172, HDL 39, TAGS 258  ? ?DM2  ?-managed by PCP ? ?Obesity  ?-Exercise and weight  loss recommended ? ?Headaches ?-this seems to come with her pulse and complains of painful pulsation in her head, neck and down her spine ?-I will get a head and neck MRI/MRA to rule out anything vascular th

## 2021-06-01 NOTE — Telephone Encounter (Signed)
-----   Message from Sueanne Margarita, MD sent at 06/01/2021  3:23 PM EDT ----- ?K+ low - please find out if patient is taking any diuretics that she may not have mentioned.  Please Take Kdur 52mq today ONLY and then increase intake of potassium rich foods going forward and repeat BMET in 1 week.  Please have her followup with PCP next week to followup on this. ?

## 2021-06-01 NOTE — Addendum Note (Signed)
Addended by: Christella Scheuermann C on: 06/01/2021 01:46 PM ? ? Modules accepted: Orders ? ?

## 2021-06-01 NOTE — Patient Instructions (Addendum)
Testing/Procedures: ?MRI- Brain ?MRA-Head/Neck ? ?Follow-Up: ?Follow up with APP in 3 months ? ?Any Other Special Instructions Will Be Listed Below (If Applicable). ? ? ? ? ?If you need a refill on your cardiac medications before your next appointment, please call your pharmacy. ? ?

## 2021-06-01 NOTE — Telephone Encounter (Signed)
Patient notified and verbalized understanding. Pt stated that she was not taking any diuretics at this time. Prescription sent in for K-Dur 40 mEq tablet (x1) to pharmacy and order for BMET (1 week) entered. PCP, copied.  ?

## 2021-06-03 DIAGNOSIS — E559 Vitamin D deficiency, unspecified: Secondary | ICD-10-CM | POA: Diagnosis not present

## 2021-06-03 DIAGNOSIS — Z0000 Encounter for general adult medical examination without abnormal findings: Secondary | ICD-10-CM | POA: Diagnosis not present

## 2021-06-03 DIAGNOSIS — M1712 Unilateral primary osteoarthritis, left knee: Secondary | ICD-10-CM | POA: Diagnosis not present

## 2021-06-08 ENCOUNTER — Telehealth: Payer: Self-pay

## 2021-06-08 ENCOUNTER — Other Ambulatory Visit (HOSPITAL_COMMUNITY)
Admission: RE | Admit: 2021-06-08 | Discharge: 2021-06-08 | Disposition: A | Discharge status: Home / Self Care | Payer: Medicare Other | Source: Ambulatory Visit | Attending: Cardiology | Admitting: Cardiology

## 2021-06-08 DIAGNOSIS — I10 Essential (primary) hypertension: Secondary | ICD-10-CM | POA: Insufficient documentation

## 2021-06-08 LAB — BASIC METABOLIC PANEL
Anion gap: 9 (ref 5–15)
BUN: 17 mg/dL (ref 8–23)
CO2: 24 mmol/L (ref 22–32)
Calcium: 9.3 mg/dL (ref 8.9–10.3)
Chloride: 107 mmol/L (ref 98–111)
Creatinine, Ser: 0.8 mg/dL (ref 0.44–1.00)
GFR, Estimated: >60 mL/min (ref >60)
Glucose, Bld: 177 mg/dL — ABNORMAL HIGH (ref 70–99)
Potassium: 3.4 mmol/L — ABNORMAL LOW (ref 3.5–5.1)
Sodium: 140 mmol/L (ref 135–145)

## 2021-06-08 NOTE — Telephone Encounter (Signed)
Patient verbalized understanding. Pt had no questions or concerns at this time.  ?

## 2021-06-10 DIAGNOSIS — Z20822 Contact with and (suspected) exposure to covid-19: Secondary | ICD-10-CM | POA: Diagnosis not present

## 2021-06-10 DIAGNOSIS — R300 Dysuria: Secondary | ICD-10-CM | POA: Diagnosis not present

## 2021-06-10 DIAGNOSIS — I10 Essential (primary) hypertension: Secondary | ICD-10-CM | POA: Diagnosis not present

## 2021-06-17 DIAGNOSIS — Z20822 Contact with and (suspected) exposure to covid-19: Secondary | ICD-10-CM | POA: Diagnosis not present

## 2021-06-20 DIAGNOSIS — E538 Deficiency of other specified B group vitamins: Secondary | ICD-10-CM | POA: Diagnosis not present

## 2021-06-20 DIAGNOSIS — I10 Essential (primary) hypertension: Secondary | ICD-10-CM | POA: Diagnosis not present

## 2021-06-20 DIAGNOSIS — M810 Age-related osteoporosis without current pathological fracture: Secondary | ICD-10-CM | POA: Diagnosis not present

## 2021-06-20 DIAGNOSIS — M25511 Pain in right shoulder: Secondary | ICD-10-CM | POA: Diagnosis not present

## 2021-06-20 DIAGNOSIS — E876 Hypokalemia: Secondary | ICD-10-CM | POA: Diagnosis not present

## 2021-06-20 DIAGNOSIS — Z6833 Body mass index (BMI) 33.0-33.9, adult: Secondary | ICD-10-CM | POA: Diagnosis not present

## 2021-06-20 DIAGNOSIS — M545 Low back pain, unspecified: Secondary | ICD-10-CM | POA: Diagnosis not present

## 2021-06-21 ENCOUNTER — Ambulatory Visit: Payer: Medicare Other

## 2021-07-06 ENCOUNTER — Ambulatory Visit: Payer: Medicare Other

## 2021-07-12 DIAGNOSIS — Z20822 Contact with and (suspected) exposure to covid-19: Secondary | ICD-10-CM | POA: Diagnosis not present

## 2021-07-12 DIAGNOSIS — Z1231 Encounter for screening mammogram for malignant neoplasm of breast: Secondary | ICD-10-CM | POA: Diagnosis not present

## 2021-07-16 DIAGNOSIS — Z20822 Contact with and (suspected) exposure to covid-19: Secondary | ICD-10-CM | POA: Diagnosis not present

## 2021-07-20 DIAGNOSIS — E559 Vitamin D deficiency, unspecified: Secondary | ICD-10-CM | POA: Diagnosis not present

## 2021-07-20 DIAGNOSIS — E782 Mixed hyperlipidemia: Secondary | ICD-10-CM | POA: Diagnosis not present

## 2021-07-20 DIAGNOSIS — E1165 Type 2 diabetes mellitus with hyperglycemia: Secondary | ICD-10-CM | POA: Diagnosis not present

## 2021-07-20 DIAGNOSIS — E7849 Other hyperlipidemia: Secondary | ICD-10-CM | POA: Diagnosis not present

## 2021-07-20 DIAGNOSIS — E039 Hypothyroidism, unspecified: Secondary | ICD-10-CM | POA: Diagnosis not present

## 2021-07-25 DIAGNOSIS — M4326 Fusion of spine, lumbar region: Secondary | ICD-10-CM | POA: Diagnosis not present

## 2021-07-25 DIAGNOSIS — Z20822 Contact with and (suspected) exposure to covid-19: Secondary | ICD-10-CM | POA: Diagnosis not present

## 2021-07-25 DIAGNOSIS — M545 Low back pain, unspecified: Secondary | ICD-10-CM | POA: Diagnosis not present

## 2021-07-27 ENCOUNTER — Other Ambulatory Visit (HOSPITAL_COMMUNITY): Payer: Medicare Other

## 2021-07-28 DIAGNOSIS — E7849 Other hyperlipidemia: Secondary | ICD-10-CM | POA: Diagnosis not present

## 2021-07-28 DIAGNOSIS — Z1331 Encounter for screening for depression: Secondary | ICD-10-CM | POA: Diagnosis not present

## 2021-07-28 DIAGNOSIS — Z1389 Encounter for screening for other disorder: Secondary | ICD-10-CM | POA: Diagnosis not present

## 2021-07-28 DIAGNOSIS — I10 Essential (primary) hypertension: Secondary | ICD-10-CM | POA: Diagnosis not present

## 2021-07-28 DIAGNOSIS — G720 Drug-induced myopathy: Secondary | ICD-10-CM | POA: Diagnosis not present

## 2021-07-28 DIAGNOSIS — Z6833 Body mass index (BMI) 33.0-33.9, adult: Secondary | ICD-10-CM | POA: Diagnosis not present

## 2021-07-28 DIAGNOSIS — E1165 Type 2 diabetes mellitus with hyperglycemia: Secondary | ICD-10-CM | POA: Diagnosis not present

## 2021-07-28 DIAGNOSIS — R300 Dysuria: Secondary | ICD-10-CM | POA: Diagnosis not present

## 2021-07-28 DIAGNOSIS — E782 Mixed hyperlipidemia: Secondary | ICD-10-CM | POA: Diagnosis not present

## 2021-08-03 ENCOUNTER — Encounter: Payer: Self-pay | Admitting: Internal Medicine

## 2021-08-04 ENCOUNTER — Ambulatory Visit (HOSPITAL_COMMUNITY): Payer: Medicare Other

## 2021-08-08 DIAGNOSIS — M810 Age-related osteoporosis without current pathological fracture: Secondary | ICD-10-CM | POA: Diagnosis not present

## 2021-08-08 DIAGNOSIS — M545 Low back pain, unspecified: Secondary | ICD-10-CM | POA: Diagnosis not present

## 2021-08-17 DIAGNOSIS — M4306 Spondylolysis, lumbar region: Secondary | ICD-10-CM | POA: Diagnosis not present

## 2021-08-18 ENCOUNTER — Other Ambulatory Visit: Payer: Self-pay

## 2021-08-18 ENCOUNTER — Encounter: Payer: Self-pay | Admitting: Internal Medicine

## 2021-08-18 ENCOUNTER — Ambulatory Visit (INDEPENDENT_AMBULATORY_CARE_PROVIDER_SITE_OTHER): Payer: Medicare Other | Admitting: Internal Medicine

## 2021-08-18 VITALS — BP 138/68 | HR 77 | Temp 97.5°F | Ht 62.0 in | Wt 191.8 lb

## 2021-08-18 DIAGNOSIS — R1032 Left lower quadrant pain: Secondary | ICD-10-CM

## 2021-08-18 DIAGNOSIS — K219 Gastro-esophageal reflux disease without esophagitis: Secondary | ICD-10-CM

## 2021-08-18 DIAGNOSIS — R197 Diarrhea, unspecified: Secondary | ICD-10-CM

## 2021-08-18 DIAGNOSIS — R1319 Other dysphagia: Secondary | ICD-10-CM

## 2021-08-18 DIAGNOSIS — R6881 Early satiety: Secondary | ICD-10-CM

## 2021-08-18 MED ORDER — PEG 3350-KCL-NA BICARB-NACL 420 G PO SOLR: 4000.0000 mL | ORAL | 0 refills | Status: DC

## 2021-08-18 NOTE — Progress Notes (Signed)
Primary Care Physician:  Lanelle Bal, PA-C Primary Gastroenterologist:  Dr. Abbey Chatters  Chief Complaint  Patient presents with   New Patient (Initial Visit)    Abd pain and diarrhea    HPI:   Kelly Wiley is a 66 y.o. female who presents to clinic today by referral from her PCP/[facility] for evaluation.  Has numerous GI complaints for me today.  Has chronic reflux which is maintained on pantoprazole 40 mg.  States as long she takes this medication her symptoms are relatively well controlled from a heartburn standpoint.  However she is having progressively worsening esophageal dysphagia.  Primarily with foods.  Feels as though they will get stuck in her substernal region.  Last EGD December 2012 with dilation due to similar issues.  H. pylori negative gastritis on biopsies.  Does note early satiety.  Feels as though she gets full very quickly.  Questionable diabetic gastroparesis in the past.  Was previously on erythromycin which she states did not help.  Unsure about metoclopramide.  Also having intermittent diarrhea 3-4 times a week.  States she was diagnosed with IBS years ago.  No melena hematochezia.  Does note some intermittent abdominal pain at least a few times weekly.  Primarily left lower quadrant, sometimes right lower quadrant.  Mild to moderate in severity.  Does not radiate.  Past Medical History:  Diagnosis Date   Cervical spine disease    DM (diabetes mellitus) ([place])    Dysphagia    Fibromyalgia    GERD (gastroesophageal reflux disease)    HTN (hypertension)    Hypercholesteremia    PONV (postoperative nausea and vomiting)    Seasonal allergies    Seizures (HCC)    last one > 20 years ago    Past Surgical History:  Procedure Laterality Date   BACK SURGERY     CARPAL TUNNEL RELEASE  RIGHT   CERVICAL SPINE SURGERY  06/19/2010   COLONOSCOPY  2008 [place]   ESOPHAGOGASTRODUODENOSCOPY  01/24/2011   YVO:[id number] 2o TO CERVICAL PLATES IN PT'S NECK AND  MILD ESO MOTILITY DISORDER/Mild gastritis   ESOPHAGOGASTRODUODENOSCOPY  03/03/2011   MMN:OTRR gastritis    FLEXIBLE SIGMOIDOSCOPY  01/24/2011   SLF: Internal Hemorrhoids   SAVORY DILATION  01/24/2011   Procedure: SAVORY DILATION;  Surgeon: Dorothyann Peng, MD;  [facility];  Service: Endoscopy;  Laterality: N/A;   SHOULDER SURGERY  RIGHT   TUBAL LIGATION      Current Outpatient Medications  Medication Sig Dispense Refill   amitriptyline (ELAVIL) 25 MG tablet Take 12.5 mg by mouth at bedtime.     aspirin-acetaminophen-caffeine (EXCEDRIN MIGRAINE) 250-250-65 MG per tablet Take 1 tablet by mouth every 6 (six) hours as needed. headache      diclofenac (VOLTAREN) 75 MG EC tablet Take 75 mg by mouth daily.     ezetimibe (ZETIA) 10 MG tablet Take 10 mg by mouth daily.     HUMALOG KWIKPEN 100 UNIT/ML KwikPen Inject into the skin.     Insulin NPH Human, Isophane, (NOVOLIN N FLEXPEN [place]) Inject into the skin.     insulin regular (NOVOLIN R,HUMULIN R) 100 units/mL injection Inject into the skin 2 (two) times daily. Sliding scale     nitrofurantoin, macrocrystal-monohydrate, (MACROBID) 100 MG capsule Take 100 mg by mouth daily.     pantoprazole (PROTONIX) 40 MG tablet Take 1 tablet by mouth 2 (two) times daily.      SEMGLEE, YFGN, 100 UNIT/ML Pen Inject  into the skin.     TYMLOS 3120 MCG/1.56ML SOPN Inject into the skin.     Vitamin D, Ergocalciferol, (DRISDOL) 1.25 MG (50000 UNIT) CAPS capsule Take 50,000 Units by mouth 2 (two) times a week.     carvedilol (COREG) 12.5 MG tablet Take 1 tablet (12.5 mg total) by mouth 2 (two) times daily. 180 tablet 0   clonazePAM (KLONOPIN) 0.5 MG tablet Take 0.5 mg by mouth daily as needed. (Patient not taking: Reported on 06/01/2021)     Evolocumab (REPATHA SURECLICK) 287 MG/ML SOAJ Inject 140 mg into the skin every 14 (fourteen) days. (Patient not taking: Reported on 06/01/2021) 2 mL 11   loratadine (CLARITIN) 10 MG tablet Take 10 mg by mouth daily as  needed. For allergies (Patient not taking: Reported on 08/18/2021)     potassium chloride SA (KLOR-CON M) 20 MEQ tablet Take 2 tablets (40 mEq total) by mouth once for 1 dose. 2 tablet 0   pramipexole (MIRAPEX) 1 MG tablet Take 1 tablet by mouth 2 (two) times daily. (Patient not taking: Reported on 06/01/2021)     No current facility-administered medications for this visit.    Allergies as of 08/18/2021 - Review Complete 08/18/2021  Allergen Reaction Noted   Ativan [lorazepam]  07/05/2015   Ethosuximide Other (See Comments) 07/21/2010   Iodine 131 Other (See Comments) 07/21/2010   Phenytoin sodium extended Other (See Comments) 07/21/2010   Sulfa antibiotics Other (See Comments) 07/21/2010    Family History  Problem Relation Age of Onset   Lung cancer Mother    Heart disease Mother        Mitral valve disease   Congenital heart disease Son        has a PPM   Diabetes Son    Colon cancer Neg Hx    Colon polyps Neg Hx     Social History   Socioeconomic History   Marital status: Widowed    Spouse name: Not on file   Number of children: Not on file   Years of education: Not on file   Highest education level: Not on file  Occupational History   Not on file  Tobacco Use   Smoking status: Former    Packs/day: 0.50    Years: 2.00    Pack years: 1.00    Types: Cigarettes    Start date: 10/12/1966    Quit date: 10/11/1968    Years since quitting: 52.8   Smokeless tobacco: Never  Vaping Use   Vaping Use: Never used  Substance and Sexual Activity   Alcohol use: Yes    Alcohol/week: 0.0 standard drinks    Comment: occasionally   Drug use: No   Sexual activity: Not on file  Other Topics Concern   Not on file  Social History Narrative   MARRIED, 2 KIDS, [place] 28 YO.   Son has DM, malabsorption   Works in Press photographer in [place]   EtOH: rare   No tobacco products   Social Determinants of Radio broadcast assistant Strain: Not on file  Food Insecurity: Not on file   Transportation Needs: Not on file  Physical Activity: Not on file  Stress: Not on file  Social Connections: Not on file  Intimate Partner Violence: Not on file    Subjective: Review of Systems  Constitutional:  Negative for chills and fever.  HENT:  Negative for congestion and hearing loss.   Eyes:  Negative for blurred vision and double vision.  Respiratory:  Negative  for cough and shortness of breath.   Cardiovascular:  Negative for chest pain and palpitations.  Gastrointestinal:  Positive for abdominal pain and diarrhea. Negative for blood in stool, constipation, heartburn, melena and vomiting.       Early satiety  Genitourinary:  Negative for dysuria and urgency.  Musculoskeletal:  Negative for joint pain and myalgias.  Skin:  Negative for itching and rash.  Neurological:  Negative for dizziness and headaches.  Psychiatric/Behavioral:  Negative for depression. The patient is not nervous/anxious.       Objective: BP 138/68   Pulse 77   Temp (!) 97.5 F (36.4 C)   Ht '5\' 2"'$  (1.575 m)   Wt 191 lb 12.8 oz (87 kg)   BMI 35.08 kg/m  Physical Exam Constitutional:      Appearance: Normal appearance.  HENT:     Head: Normocephalic and atraumatic.  Eyes:     Extraocular Movements: Extraocular movements intact.     Conjunctiva/sclera: Conjunctivae normal.  Cardiovascular:     Rate and Rhythm: Normal rate and regular rhythm.  Pulmonary:     Effort: Pulmonary effort is normal.     Breath sounds: Normal breath sounds.  Abdominal:     General: Bowel sounds are normal.     Palpations: Abdomen is soft.  Musculoskeletal:        General: No swelling. Normal range of motion.     Cervical back: Normal range of motion and neck supple.  Skin:    General: Skin is warm and dry.     Coloration: Skin is not jaundiced.  Neurological:     General: No focal deficit present.     Mental Status: She is alert and oriented to person, place, and time.  Psychiatric:        Mood and Affect:  Mood normal.        Behavior: Behavior normal.     Assessment: *Abdominal pain-chronic,intermittent *Diarrhea *Chronic GERD-well controlled on Pantoprazole BID *Early satiety  *Esophageal dysphagia  Plan: Will schedule for EGD with possible dilation to evaluate for peptic ulcer disease, esophagitis, gastritis, H. Pylori, duodenitis, or other. Will also evaluate for esophageal stricture, Schatzki's ring, esophageal web or other.   At the same time we will perform colonoscopy with random colon biopsies to further evaluate her chronic abdominal pain as well as diarrhea.  The risks including infection, bleed, or perforation as well as benefits, limitations, alternatives and imponderables have been reviewed with the patient. Potential for esophageal dilation, biopsy, etc. have also been reviewed.  Questions have been answered. All parties agreeable.  Continue pantoprazole for chronic GERD which is relatively well controlled.  Pending endoscopic evaluation, can trial on Reglan for diabetic gastroparesis.  Recommended 6-8 small meals daily.   08/18/2021 2:45 PM   Disclaimer: This note was dictated with voice recognition software. Similar sounding words can inadvertently be transcribed and may not be corrected upon review.

## 2021-08-18 NOTE — Patient Instructions (Signed)
We will schedule you for upper endoscopy and colonoscopy to further evaluate your early satiety, chronic reflux, difficulty swallowing, abdominal pain, and diarrhea.  Continue on pantoprazole for your chronic reflux.  Further recommendations to follow after procedures.  It was very nice meeting you today.  Dr. Abbey Chatters  At [facility] we value your feedback. You may receive a survey about your visit today. Please share your experience as we strive to create trusting relationships with our patients to provide genuine, compassionate, quality care.  We appreciate your understanding and patience as we review any laboratory studies, imaging, and other diagnostic tests that are ordered as we care for you. Our office policy is 5 business days for review of these results, and any emergent or urgent results are addressed in a timely manner for your best interest. If you do not hear from our office in 1 week, please contact us.   We also encourage the use of MyChart, which contains your medical information for your review as well. If you are not enrolled in this feature, an access code is on this after visit summary for your convenience. Thank you for allowing [place] to be involved in your care.  It was great to see you today!  I hope you have a great rest of your Spring!    Elon Alas. Abbey Chatters, D.O. Gastroenterology and Hepatology [facility]

## 2021-08-23 ENCOUNTER — Ambulatory Visit (HOSPITAL_COMMUNITY)
Admission: RE | Admit: 2021-08-23 | Discharge: 2021-08-23 | Disposition: A | Discharge status: Home / Self Care | Payer: Medicare Other | Source: Ambulatory Visit | Attending: Cardiology | Admitting: Cardiology

## 2021-08-23 ENCOUNTER — Other Ambulatory Visit: Payer: Self-pay | Admitting: Cardiology

## 2021-08-23 DIAGNOSIS — R519 Headache, unspecified: Secondary | ICD-10-CM | POA: Insufficient documentation

## 2021-08-23 DIAGNOSIS — H93A9 Pulsatile tinnitus, unspecified ear: Secondary | ICD-10-CM

## 2021-08-24 DIAGNOSIS — M4306 Spondylolysis, lumbar region: Secondary | ICD-10-CM | POA: Diagnosis not present

## 2021-09-01 ENCOUNTER — Ambulatory Visit: Payer: Medicare Other | Admitting: Student

## 2021-09-01 DIAGNOSIS — M4306 Spondylolysis, lumbar region: Secondary | ICD-10-CM | POA: Diagnosis not present

## 2021-09-01 NOTE — Progress Notes (Deleted)
Cardiology Office Note    Date:  09/01/2021   ID:  Kelly Wiley, DOB [birth year], MRN [id number]  PCP:  Lanelle Bal, PA-C  Cardiologist: None    No chief complaint on file.   History of Present Illness:    Kelly Wiley is a 65 y.o. female with past medical history of palpitations (prior monitor showing rare SVT and isolated PVC's with a 1% burden), HTN, HLD, Type II DM, GERD and fibromyalgia who presents to the office today for 108-monthfollow-up.  She was last examined by Dr. TRadford Paxin 05/2021 and reported overall doing well at that time and denied any recent angina.  She reported occasional palpitations but this had improved when compared to prior visits.  Still she was having some symptoms consistent with orthostasis and she was encouraged to use compression stockings.  She was continued on Coreg 12.5 mg twice daily for her palpitations.  She reported worsening pulsations along her head and neck and an MRI was ordered.  This showed no acute intracranial abnormalities but she was noted to have mild chronic small vessel disease.  Assessment of the vertebral and carotid arteries was limited as she refused contrast and it was recommended she follow-up with her PCP.    Past Medical History:  Diagnosis Date   Cervical spine disease    [place]    Dysphagia    Fibromyalgia    GERD (gastroesophageal reflux disease)    HTN (hypertension)    Hypercholesteremia    PONV (postoperative nausea and vomiting)    Seasonal allergies    Seizures (HCC)    last one > 20 years ago    Past Surgical History:  Procedure Laterality Date   BACK SURGERY     CARPAL TUNNEL RELEASE  RIGHT   CERVICAL SPINE SURGERY  06/19/2010   [place]   ESOPHAGOGASTRODUODENOSCOPY  01/24/2011   [id number] TO CERVICAL PLATES IN PT'S NECK AND MILD ESO MOTILITY DISORDER/Mild gastritis   ESOPHAGOGASTRODUODENOSCOPY  03/03/2011   SYKD:XIPJgastritis    FLEXIBLE  SIGMOIDOSCOPY  01/24/2011   SLF: Internal Hemorrhoids   SAVORY DILATION  01/24/2011   Procedure: SAVORY DILATION;  Surgeon: SDorothyann Peng MD;  [facility];  Service: Endoscopy;  Laterality: N/A;   SHOULDER SURGERY  RIGHT   TUBAL LIGATION      Current Medications: Outpatient Medications Prior to Visit  Medication Sig Dispense Refill   amitriptyline (ELAVIL) 25 MG tablet Take 12.5 mg by mouth at bedtime.     aspirin-acetaminophen-caffeine (EXCEDRIN MIGRAINE) 250-250-65 MG per tablet Take 1 tablet by mouth every 6 (six) hours as needed. headache      carvedilol (COREG) 12.5 MG tablet Take 1 tablet (12.5 mg total) by mouth 2 (two) times daily. 180 tablet 0   clonazePAM (KLONOPIN) 0.5 MG tablet Take 0.5 mg by mouth daily as needed. (Patient not taking: Reported on 06/01/2021)     diclofenac (VOLTAREN) 75 MG EC tablet Take 75 mg by mouth daily.     Evolocumab (REPATHA SURECLICK) 1825MG/ML SOAJ Inject 140 mg into the skin every 14 (fourteen) days. (Patient not taking: Reported on 06/01/2021) 2 mL 11   ezetimibe (ZETIA) 10 MG tablet Take 10 mg by mouth daily.     HUMALOG KWIKPEN 100 UNIT/ML KwikPen Inject into the skin.     Insulin NPH Human, Isophane, (NOVOLIN N FLEXPEN [place]) Inject into the skin.     insulin  regular (NOVOLIN R,HUMULIN R) 100 units/mL injection Inject into the skin 2 (two) times daily. Sliding scale     loratadine (CLARITIN) 10 MG tablet Take 10 mg by mouth daily as needed. For allergies (Patient not taking: Reported on 08/18/2021)     nitrofurantoin, macrocrystal-monohydrate, (MACROBID) 100 MG capsule Take 100 mg by mouth daily.     pantoprazole (PROTONIX) 40 MG tablet Take 1 tablet by mouth 2 (two) times daily.      polyethylene glycol-electrolytes (TRILYTE) 420 g solution Take 4,000 mLs by mouth as directed. 4000 mL 0   potassium chloride SA (KLOR-CON M) 20 MEQ tablet Take 2 tablets (40 mEq total) by mouth once for 1 dose. 2 tablet 0   pramipexole (MIRAPEX) 1 MG tablet  Take 1 tablet by mouth 2 (two) times daily. (Patient not taking: Reported on 06/01/2021)     SEMGLEE, YFGN, 100 UNIT/ML Pen Inject into the skin.     TYMLOS 3120 MCG/1.56ML SOPN Inject into the skin.     Vitamin D, Ergocalciferol, (DRISDOL) 1.25 MG (50000 UNIT) CAPS capsule Take 50,000 Units by mouth 2 (two) times a week.     No facility-administered medications prior to visit.     Allergies:   Ativan [lorazepam], Ethosuximide, Iodine 131, Phenytoin sodium extended, and Sulfa antibiotics   Social History   Socioeconomic History   Marital status: Widowed    Spouse name: Not on file   Number of children: Not on file   Years of education: Not on file   Highest education level: Not on file  Occupational History   Not on file  Tobacco Use   Smoking status: Former    Packs/day: 0.50    Years: 2.00    Total pack years: 1.00    Types: Cigarettes    Start date: 10/12/1966    Quit date: 10/11/1968    Years since quitting: 52.9   Smokeless tobacco: Never  Vaping Use   Vaping Use: Never used  Substance and Sexual Activity   Alcohol use: Yes    Alcohol/week: 0.0 standard drinks of alcohol    Comment: occasionally   Drug use: No   Sexual activity: Not on file  Other Topics Concern   Not on file  Social History Narrative   MARRIED, 2 KIDS, [place] 40 YO.   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   Review of Systems:    Please see the history of present illness.     All other systems reviewed and are otherwise negative  except as noted above.   Physical Exam:    VS:  There were no vitals taken for this visit.   General: Well developed, well nourished,female appearing in no acute distress. Head: Normocephalic, atraumatic. Neck: No carotid bruits. JVD not elevated.  Lungs: Respirations regular and unlabored, without wheezes or rales.  Heart: ***Regular rate and rhythm. No S3 or S4.  No murmur, no rubs, or gallops appreciated. Abdomen: Appears non-distended. No obvious abdominal masses. Msk:  Strength and tone appear normal for age. No obvious joint deformities or effusions. Extremities: No clubbing or cyanosis. No edema.  Distal pedal pulses are 2+ bilaterally. Neuro: Alert and oriented X 3. Moves all extremities spontaneously. No focal deficits noted. Psych:  Responds to questions appropriately with a normal affect. Skin: No rashes or lesions noted  Wt Readings from Last 3 Encounters:  08/18/21 191 lb 12.8 oz (87 kg)  06/01/21 190 lb 9.6 oz (86.5 kg)  02/23/21 193 lb (87.5 kg)        Studies/Labs Reviewed:   EKG:  EKG is*** ordered today.  The ekg ordered today demonstrates ***  Recent Labs: 06/08/2021: BUN 17; Creatinine, Ser 0.80; Potassium 3.4; Sodium 140   Lipid Panel No results found for: "CHOL", "TRIG", "HDL", "CHOLHDL", "VLDL", "LDLCALC", "LDLDIRECT"  Additional studies/ records that were reviewed today include:   Event Monitor: 01/2021 10 day monitor Rare supraventricular ectopy in the form of isolated PACs, couplets, triplets. Rare runs of SVT up to 17 beats Occasional ventricular ectopy in the form of isolated PVCs (1%). Rare couplets. Symptoms correlated with sinus rhythm and PVCs     Patch Wear Time:  10 days and 6 hours (2022-10-26T08:54:34-0400 to 2022-11-05T15:29:03-398)   Patient had a min HR of 60 bpm, max HR of 218 bpm, and avg HR of 83 bpm. Predominant underlying rhythm was Sinus Rhythm. 4 Supraventricular Tachycardia runs occurred, the run with the fastest interval  lasting 9 beats with a max rate of 218 bpm, the  longest lasting 17 beats with an avg rate of 174 bpm. Isolated SVEs were rare (<1.0%), SVE Couplets were rare (<1.0%), and SVE Triplets were rare (<1.0%). Isolated VEs were occasional (1.0%, 12256), VE Couplets were rare (<1.0%, 2), and no VE Triplets  were present. Ventricular Bigeminy and Trigeminy were present.   Assessment:    No diagnosis found.   Plan:   In order of problems listed above:  ***    Shared Decision Making/Informed Consent:   {Are you ordering a CV Procedure (e.g. stress test, cath, DCCV, TEE, etc)?   Press F2        :[id number]}    Medication Adjustments/Labs and Tests Ordered: Current medicines are reviewed at length with the patient today.  Concerns regarding medicines are outlined above.  Medication changes, Labs and Tests ordered today are listed in the Patient Instructions below. There are no Patient Instructions on file for this visit.   Signed, Erma Heritage, PA-C  09/01/2021 7:31 AM    [place] [address] [place], [place] [address] Phone: [phone] Fax: [phone]

## 2021-09-05 ENCOUNTER — Other Ambulatory Visit: Payer: Self-pay | Admitting: Physician Assistant

## 2021-09-05 ENCOUNTER — Ambulatory Visit: Payer: Medicare Other

## 2021-09-06 DIAGNOSIS — M4306 Spondylolysis, lumbar region: Secondary | ICD-10-CM | POA: Diagnosis not present

## 2021-09-08 NOTE — Patient Instructions (Signed)
Kelly Wiley  09/08/2021     '@PREFPERIOPPHARMACY'$ @   Your procedure is scheduled on  09/19/2021.   Report to [facility] at  1015  A.M.   Call this number if you have problems the morning of surgery:  [phone]   Remember:  Follow the diet and prep instructions given to you by the office.    If your take your insulin before bed, take only 1/2 of your usual dosage.    DO NOT take any medications for diabetes the morning of your procedure.     Take these medicines the morning of surgery with A SIP OF WATER                              coreg, protonix.     Do not wear jewelry, make-up or nail polish.  Do not wear lotions, powders, or perfumes, or deodorant.  Do not shave 48 hours prior to surgery.  Men may shave face and neck.  Do not bring valuables to the hospital.  [facility] is not responsible for any belongings or valuables.  Contacts, dentures or bridgework may not be worn into surgery.  Leave your suitcase in the car.  After surgery it may be brought to your room.  For patients admitted to the hospital, discharge time will be determined by your treatment team.  Patients discharged the day of surgery will not be allowed to drive home and must have someone with them for 24 hours.    Special instructions:   DO NOT smoke tobacco or vape for 24 hours before your procedure.  Please read over the following fact sheets that you were given. Anesthesia Post-op Instructions and Care and Recovery After Surgery      Upper Endoscopy, Adult, Care After This sheet gives you information about how to care for yourself after your procedure. Your health care provider may also give you more specific instructions. If you have problems or questions, contact your health care provider. What can I expect after the procedure? After the procedure, it is common to have: A sore throat. Mild stomach pain or discomfort. Bloating. Nausea. Follow these instructions at  home:  Follow instructions from your health care provider about what to eat or drink after your procedure. Return to your normal activities as told by your health care provider. Ask your health care provider what activities are safe for you. Take over-the-counter and prescription medicines only as told by your health care provider. If you were given a sedative during the procedure, it can affect you for several hours. Do not drive or operate machinery until your health care provider says that it is safe. Keep all follow-up visits as told by your health care provider. This is important. Contact a health care provider if you have: A sore throat that lasts longer than one day. Trouble swallowing. Get help right away if: You vomit blood or your vomit looks like coffee grounds. You have: A fever. Bloody, black, or tarry stools. A severe sore throat or you cannot swallow. Difficulty breathing. Severe pain in your chest or abdomen. Summary After the procedure, it is common to have a sore throat, mild stomach discomfort, bloating, and nausea. If you were given a sedative during the procedure, it can affect you for several hours. Do not drive or operate machinery until your health care provider says that it is safe.  Follow instructions from your health care provider about what to eat or drink after your procedure. Return to your normal activities as told by your health care provider. This information is not intended to replace advice given to you by your health care provider. Make sure you discuss any questions you have with your health care provider. Document Revised: 01/10/2019 Document Reviewed: 08/06/2017 Elsevier Patient Education  [place]. Esophageal Dilatation Esophageal dilatation, also called esophageal dilation, is a procedure to widen or open a blocked or narrowed part of the esophagus. The esophagus is the part of the body that moves food and liquid from the mouth to the  stomach. You may need this procedure if: You have a buildup of scar tissue in your esophagus that makes it difficult, painful, or impossible to swallow. This can be caused by gastroesophageal reflux disease (GERD). You have cancer of the esophagus. There is a problem with how food moves through your esophagus. In some cases, you may need this procedure repeated at a later time to dilate the esophagus gradually. Tell a health care provider about: Any allergies you have. All medicines you are taking, including vitamins, herbs, eye drops, creams, and over-the-counter medicines. Any problems you or family members have had with anesthetic medicines. Any blood disorders you have. Any surgeries you have had. Any medical conditions you have. Any antibiotic medicines you are required to take before dental procedures. Whether you are pregnant or may be pregnant. What are the risks? Generally, this is a safe procedure. However, problems may occur, including: Bleeding due to a tear in the lining of the esophagus. A hole, or perforation, in the esophagus. What happens before the procedure? Ask your health care provider about: Changing or stopping your regular medicines. This is especially important if you are taking diabetes medicines or blood thinners. Taking medicines such as aspirin and ibuprofen. These medicines can thin your blood. Do not take these medicines unless your health care provider tells you to take them. Taking over-the-counter medicines, vitamins, herbs, and supplements. Follow instructions from your health care provider about eating or drinking restrictions. Plan to have a responsible adult take you home from the hospital or clinic. Plan to have a responsible adult care for you for the time you are told after you leave the hospital or clinic. This is important. What happens during the procedure? You may be given a medicine to help you relax (sedative). A numbing medicine may be  sprayed into the back of your throat, or you may gargle the medicine. Your health care provider may perform the dilatation using various surgical instruments, such as: Simple dilators. This instrument is carefully placed in the esophagus to stretch it. Guided wire bougies. This involves using an endoscope to insert a wire into the esophagus. A dilator is passed over this wire to enlarge the esophagus. Then the wire is removed. Balloon dilators. An endoscope with a small balloon is inserted into the esophagus. The balloon is inflated to stretch the esophagus and open it up. The procedure may vary among health care providers and hospitals. What can I expect after the procedure? Your blood pressure, heart rate, breathing rate, and blood oxygen level will be monitored until you leave the hospital or clinic. Your throat may feel slightly sore and numb. This will get better over time. You will not be allowed to eat or drink until your throat is no longer numb. When you are able to drink, urinate, and sit on the edge of the  bed without nausea or dizziness, you may be able to return home. Follow these instructions at home: Take over-the-counter and prescription medicines only as told by your health care provider. If you were given a sedative during the procedure, it can affect you for several hours. Do not drive or operate machinery until your health care provider says that it is safe. Plan to have a responsible adult care for you for the time you are told. This is important. Follow instructions from your health care provider about any eating or drinking restrictions. Do not use any products that contain nicotine or tobacco, such as cigarettes, e-cigarettes, and chewing tobacco. If you need help quitting, ask your health care provider. Keep all follow-up visits. This is important. Contact a health care provider if: You have a fever. You have pain that is not relieved by medicine. Get help right away  if: You have chest pain. You have trouble breathing. You have trouble swallowing. You vomit blood. You have black, tarry, or bloody stools. These symptoms may represent a serious problem that is an emergency. Do not wait to see if the symptoms will go away. Get medical help right away. Call your local emergency services (911 in the [place] Do not drive yourself to the hospital. Summary Esophageal dilatation, also called esophageal dilation, is a procedure to widen or open a blocked or narrowed part of the esophagus. Plan to have a responsible adult take you home from the hospital or clinic. For this procedure, a numbing medicine may be sprayed into the back of your throat, or you may gargle the medicine. Do not drive or operate machinery until your health care provider says that it is safe. This information is not intended to replace advice given to you by your health care provider. Make sure you discuss any questions you have with your health care provider. Document Revised: 07/23/2019 Document Reviewed: 07/23/2019 Elsevier Patient Education  [place]. Colonoscopy, Adult, Care After The following information offers guidance on how to care for yourself after your procedure. Your health care provider may also give you more specific instructions. If you have problems or questions, contact your health care provider. What can I expect after the procedure? After the procedure, it is common to have: A small amount of blood in your stool for 24 hours after the procedure. Some gas. Mild cramping or bloating of your abdomen. Follow these instructions at home: Eating and drinking  Drink enough fluid to keep your urine pale yellow. Follow instructions from your health care provider about eating or drinking restrictions. Resume your normal diet as told by your health care provider. Avoid heavy or fried foods that are hard to digest. Activity Rest as told by your health care provider. Avoid  sitting for a long time without moving. Get up to take short walks every 1-2 hours. This is important to improve blood flow and breathing. Ask for help if you feel weak or unsteady. Return to your normal activities as told by your health care provider. Ask your health care provider what activities are safe for you. Managing cramping and bloating  Try walking around when you have cramps or feel bloated. If directed, apply heat to your abdomen as told by your health care provider. Use the heat source that your health care provider recommends, such as a moist heat pack or a heating pad. Place a towel between your skin and the heat source. Leave the heat on for 20-30 minutes. Remove the heat if your  skin turns bright red. This is especially important if you are unable to feel pain, heat, or cold. You have a greater risk of getting burned. General instructions If you were given a sedative during the procedure, it can affect you for several hours. Do not drive or operate machinery until your health care provider says that it is safe. For the first 24 hours after the procedure: Do not sign important documents. Do not drink alcohol. Do your regular daily activities at a slower pace than normal. Eat soft foods that are easy to digest. Take over-the-counter and prescription medicines only as told by your health care provider. Keep all follow-up visits. This is important. Contact a health care provider if: You have blood in your stool 2-3 days after the procedure. Get help right away if: You have more than a small spotting of blood in your stool. You have large blood clots in your stool. You have swelling of your abdomen. You have nausea or vomiting. You have a fever. You have increasing pain in your abdomen that is not relieved with medicine. These symptoms may be an emergency. Get help right away. Call 911. Do not wait to see if the symptoms will go away. Do not drive yourself to the  hospital. Summary After the procedure, it is common to have a small amount of blood in your stool. You may also have mild cramping and bloating of your abdomen. If you were given a sedative during the procedure, it can affect you for several hours. Do not drive or operate machinery until your health care provider says that it is safe. Get help right away if you have a lot of blood in your stool, nausea or vomiting, a fever, or increased pain in your abdomen. This information is not intended to replace advice given to you by your health care provider. Make sure you discuss any questions you have with your health care provider. Document Revised: 10/27/2020 Document Reviewed: 10/27/2020 Elsevier Patient Education  [place] After This sheet gives you information about how to care for yourself after your procedure. Your health care provider may also give you more specific instructions. If you have problems or questions, contact your health care provider. What can I expect after the procedure? After the procedure, it is common to have: Tiredness. Forgetfulness about what happened after the procedure. Impaired judgment for important decisions. Nausea or vomiting. Some difficulty with balance. Follow these instructions at home: For the time period you were told by your health care provider:     Rest as needed. Do not participate in activities where you could fall or become injured. Do not drive or use machinery. Do not drink alcohol. Do not take sleeping pills or medicines that cause drowsiness. Do not make important decisions or sign legal documents. Do not take care of children on your own. Eating and drinking Follow the diet that is recommended by your health care provider. Drink enough fluid to keep your urine pale yellow. If you vomit: Drink water, juice, or soup when you can drink without vomiting. Make sure you have little or no nausea  before eating solid foods. General instructions Have a responsible adult stay with you for the time you are told. It is important to have someone help care for you until you are awake and alert. Take over-the-counter and prescription medicines only as told by your health care provider. If you have sleep apnea, surgery and certain medicines can increase your  risk for breathing problems. Follow instructions from your health care provider about wearing your sleep device: Anytime you are sleeping, including during daytime naps. While taking prescription pain medicines, sleeping medicines, or medicines that make you drowsy. Avoid smoking. Keep all follow-up visits as told by your health care provider. This is important. Contact a health care provider if: You keep feeling nauseous or you keep vomiting. You feel light-headed. You are still sleepy or having trouble with balance after 24 hours. You develop a rash. You have a fever. You have redness or swelling around the IV site. Get help right away if: You have trouble breathing. You have new-onset confusion at home. Summary For several hours after your procedure, you may feel tired. You may also be forgetful and have poor judgment. Have a responsible adult stay with you for the time you are told. It is important to have someone help care for you until you are awake and alert. Rest as told. Do not drive or operate machinery. Do not drink alcohol or take sleeping pills. Get help right away if you have trouble breathing, or if you suddenly become confused. This information is not intended to replace advice given to you by your health care provider. Make sure you discuss any questions you have with your health care provider. Document Revised: 02/08/2021 Document Reviewed: 02/06/2019 Elsevier Patient Education  [place].

## 2021-09-12 ENCOUNTER — Encounter: Payer: Self-pay | Admitting: General Practice

## 2021-09-12 DIAGNOSIS — M4306 Spondylolysis, lumbar region: Secondary | ICD-10-CM | POA: Diagnosis not present

## 2021-09-13 DIAGNOSIS — M545 Low back pain, unspecified: Secondary | ICD-10-CM | POA: Diagnosis not present

## 2021-09-13 DIAGNOSIS — M25551 Pain in right hip: Secondary | ICD-10-CM | POA: Diagnosis not present

## 2021-09-13 DIAGNOSIS — R300 Dysuria: Secondary | ICD-10-CM | POA: Diagnosis not present

## 2021-09-13 DIAGNOSIS — R030 Elevated blood-pressure reading, without diagnosis of hypertension: Secondary | ICD-10-CM | POA: Diagnosis not present

## 2021-09-13 DIAGNOSIS — Z6833 Body mass index (BMI) 33.0-33.9, adult: Secondary | ICD-10-CM | POA: Diagnosis not present

## 2021-09-13 DIAGNOSIS — M25552 Pain in left hip: Secondary | ICD-10-CM | POA: Diagnosis not present

## 2021-09-15 ENCOUNTER — Encounter (HOSPITAL_COMMUNITY)
Admission: RE | Admit: 2021-09-15 | Discharge: 2021-09-15 | Disposition: A | Discharge status: Home / Self Care | Payer: Medicare Other | Source: Ambulatory Visit | Attending: Internal Medicine | Admitting: Internal Medicine

## 2021-09-15 VITALS — BP 160/87 | HR 80 | Temp 97.8°F | Resp 18 | Ht 62.0 in | Wt 186.0 lb

## 2021-09-15 DIAGNOSIS — E119 Type 2 diabetes mellitus without complications: Secondary | ICD-10-CM | POA: Diagnosis not present

## 2021-09-15 DIAGNOSIS — Z01812 Encounter for preprocedural laboratory examination: Secondary | ICD-10-CM | POA: Diagnosis not present

## 2021-09-15 LAB — BASIC METABOLIC PANEL
Anion gap: 6 (ref 5–15)
BUN: 18 mg/dL (ref 8–23)
CO2: 24 mmol/L (ref 22–32)
Calcium: 9.2 mg/dL (ref 8.9–10.3)
Chloride: 109 mmol/L (ref 98–111)
Creatinine, Ser: 0.81 mg/dL (ref 0.44–1.00)
GFR, Estimated: >60 mL/min (ref >60)
Glucose, Bld: 168 mg/dL — ABNORMAL HIGH (ref 70–99)
Potassium: 3.5 mmol/L (ref 3.5–5.1)
Sodium: 139 mmol/L (ref 135–145)

## 2021-09-19 ENCOUNTER — Ambulatory Visit (HOSPITAL_COMMUNITY): Payer: Medicare Other | Admitting: Anesthesiology

## 2021-09-19 ENCOUNTER — Encounter (HOSPITAL_COMMUNITY): Payer: Self-pay

## 2021-09-19 ENCOUNTER — Ambulatory Visit (HOSPITAL_BASED_OUTPATIENT_CLINIC_OR_DEPARTMENT_OTHER): Payer: Medicare Other | Admitting: Anesthesiology

## 2021-09-19 ENCOUNTER — Encounter (HOSPITAL_COMMUNITY)
Admission: RE | Disposition: A | Discharge status: Home / Self Care | Payer: Self-pay | Source: Home / Self Care | Attending: Internal Medicine

## 2021-09-19 ENCOUNTER — Ambulatory Visit (HOSPITAL_COMMUNITY)
Admission: RE | Admit: 2021-09-19 | Discharge: 2021-09-19 | Disposition: A | Discharge status: Home / Self Care | Payer: Medicare Other | Attending: Internal Medicine | Admitting: Internal Medicine

## 2021-09-19 DIAGNOSIS — K2950 Unspecified chronic gastritis without bleeding: Secondary | ICD-10-CM | POA: Diagnosis not present

## 2021-09-19 DIAGNOSIS — K449 Diaphragmatic hernia without obstruction or gangrene: Secondary | ICD-10-CM | POA: Diagnosis not present

## 2021-09-19 DIAGNOSIS — D124 Benign neoplasm of descending colon: Secondary | ICD-10-CM | POA: Diagnosis not present

## 2021-09-19 DIAGNOSIS — R1012 Left upper quadrant pain: Secondary | ICD-10-CM | POA: Diagnosis not present

## 2021-09-19 DIAGNOSIS — E785 Hyperlipidemia, unspecified: Secondary | ICD-10-CM

## 2021-09-19 DIAGNOSIS — K635 Polyp of colon: Secondary | ICD-10-CM | POA: Diagnosis not present

## 2021-09-19 DIAGNOSIS — R634 Abnormal weight loss: Secondary | ICD-10-CM

## 2021-09-19 DIAGNOSIS — Z87891 Personal history of nicotine dependence: Secondary | ICD-10-CM | POA: Insufficient documentation

## 2021-09-19 DIAGNOSIS — M797 Fibromyalgia: Secondary | ICD-10-CM | POA: Insufficient documentation

## 2021-09-19 DIAGNOSIS — R6881 Early satiety: Secondary | ICD-10-CM | POA: Diagnosis not present

## 2021-09-19 DIAGNOSIS — K222 Esophageal obstruction: Secondary | ICD-10-CM

## 2021-09-19 DIAGNOSIS — I10 Essential (primary) hypertension: Secondary | ICD-10-CM

## 2021-09-19 DIAGNOSIS — R55 Syncope and collapse: Secondary | ICD-10-CM

## 2021-09-19 DIAGNOSIS — R197 Diarrhea, unspecified: Secondary | ICD-10-CM

## 2021-09-19 DIAGNOSIS — K2970 Gastritis, unspecified, without bleeding: Secondary | ICD-10-CM

## 2021-09-19 DIAGNOSIS — R1310 Dysphagia, unspecified: Secondary | ICD-10-CM | POA: Insufficient documentation

## 2021-09-19 DIAGNOSIS — K529 Noninfective gastroenteritis and colitis, unspecified: Secondary | ICD-10-CM

## 2021-09-19 DIAGNOSIS — K219 Gastro-esophageal reflux disease without esophagitis: Secondary | ICD-10-CM | POA: Diagnosis not present

## 2021-09-19 DIAGNOSIS — R110 Nausea: Secondary | ICD-10-CM

## 2021-09-19 DIAGNOSIS — E119 Type 2 diabetes mellitus without complications: Secondary | ICD-10-CM | POA: Insufficient documentation

## 2021-09-19 HISTORY — PX: BALLOON DILATION: SHX5330

## 2021-09-19 HISTORY — PX: ESOPHAGOGASTRODUODENOSCOPY (EGD) WITH PROPOFOL: SHX5813

## 2021-09-19 HISTORY — PX: COLONOSCOPY WITH PROPOFOL: SHX5780

## 2021-09-19 HISTORY — PX: BIOPSY: SHX5522

## 2021-09-19 LAB — GLUCOSE, CAPILLARY: Glucose-Capillary: 182 mg/dL — ABNORMAL HIGH (ref 70–99)

## 2021-09-19 SURGERY — COLONOSCOPY WITH PROPOFOL
Anesthesia: General

## 2021-09-19 MED ORDER — ONDANSETRON HCL 4 MG/2ML IJ SOLN
INTRAMUSCULAR | Status: AC
  Filled 2021-09-19: qty 2

## 2021-09-19 MED ORDER — LIDOCAINE 2% (20 MG/ML) 5 ML SYRINGE
INTRAMUSCULAR | Status: DC | PRN
  Administered 2021-09-19: 50 mg via INTRAVENOUS

## 2021-09-19 MED ORDER — ONDANSETRON HCL 4 MG/2ML IJ SOLN
4.0000 mg | Freq: Once | INTRAMUSCULAR | Status: AC
Start: 2021-09-19 — End: 2021-09-19
  Administered 2021-09-19: 4 mg via INTRAVENOUS

## 2021-09-19 MED ORDER — PHENYLEPHRINE 80 MCG/ML (10ML) SYRINGE FOR IV PUSH (FOR BLOOD PRESSURE SUPPORT)
PREFILLED_SYRINGE | INTRAVENOUS | Status: DC | PRN
  Administered 2021-09-19: 160 ug via INTRAVENOUS

## 2021-09-19 MED ORDER — LACTATED RINGERS IV SOLN: INTRAVENOUS | Status: DC

## 2021-09-19 MED ORDER — PROPOFOL 500 MG/50ML IV EMUL
INTRAVENOUS | Status: DC | PRN
  Administered 2021-09-19: 200 ug/kg/min via INTRAVENOUS

## 2021-09-19 MED ORDER — PROPOFOL 10 MG/ML IV BOLUS
INTRAVENOUS | Status: DC | PRN
  Administered 2021-09-19: 80 mg via INTRAVENOUS
  Administered 2021-09-19: 40 mg via INTRAVENOUS

## 2021-09-19 NOTE — Transfer of Care (Signed)
Immediate Anesthesia Transfer of Care Note  Patient: Kelly Wiley  Procedure(s) Performed: COLONOSCOPY WITH PROPOFOL ESOPHAGOGASTRODUODENOSCOPY (EGD) WITH PROPOFOL BALLOON DILATION BIOPSY  Patient Location: Endoscopy Unit  Anesthesia Type:MAC  Level of Consciousness: awake, alert , oriented and patient cooperative  Airway & Oxygen Therapy: Patient Spontanous Breathing and Patient connected to nasal cannula oxygen  Post-op Assessment: Report given to RN, Post -op Vital signs reviewed and stable and Patient moving all extremities  Post vital signs: Reviewed and stable  Last Vitals:  Vitals Value Taken Time  BP 95/50 09/19/21 1124  Temp 36.7 C 09/19/21 1124  Pulse 69 09/19/21 1124  Resp 16 09/19/21 1124  SpO2 95 % 09/19/21 1124    Last Pain:  Vitals:   09/19/21 1124  TempSrc: Oral  PainSc: Asleep      Patients Stated Pain Goal: 7 (37/[id number])  Complications: No notable events documented.

## 2021-09-19 NOTE — Anesthesia Preprocedure Evaluation (Signed)
Anesthesia Evaluation  Patient identified by MRN, date of birth, ID band Patient awake    Reviewed: Allergy & Precautions, NPO status , Patient's Chart, lab work & pertinent test results, reviewed documented beta blocker date and time   History of Anesthesia Complications (+) PONV and history of anesthetic complications  Airway Mallampati: II  TM Distance: >3 FB Neck ROM: Full   Comment: Cervical spine sx Dental  (+) Dental Advisory Given, Missing   Pulmonary former smoker,    Pulmonary exam normal breath sounds clear to auscultation       Cardiovascular Exercise Tolerance: Good hypertension, Pt. on medications and Pt. on home beta blockers Normal cardiovascular exam Rhythm:Regular Rate:Normal     Neuro/Psych Seizures -, Well Controlled,   Neuromuscular disease negative psych ROS   GI/Hepatic Neg liver ROS, GERD  Medicated and Controlled,  Endo/Other  diabetes, Well Controlled, Type 2, Oral Hypoglycemic Agents  Renal/GU negative Renal ROS  negative genitourinary   Musculoskeletal  (+) Fibromyalgia -  Abdominal   Peds negative pediatric ROS (+)  Hematology negative hematology ROS (+)   Anesthesia Other Findings Cervical spine sx  Reproductive/Obstetrics negative OB ROS                            Anesthesia Physical Anesthesia Plan  ASA: 2  Anesthesia Plan: General   Post-op Pain Management: Minimal or no pain anticipated   Induction:   PONV Risk Score and Plan: Propofol infusion  Airway Management Planned: Nasal Cannula and Natural Airway  Additional Equipment:   Intra-op Plan:   Post-operative Plan:   Informed Consent: I have reviewed the patients History and Physical, chart, labs and discussed the procedure including the risks, benefits and alternatives for the proposed anesthesia with the patient or authorized representative who has indicated his/her understanding and  acceptance.     Dental advisory given  Plan Discussed with: CRNA and Surgeon  Anesthesia Plan Comments:        Anesthesia Quick Evaluation

## 2021-09-19 NOTE — Op Note (Addendum)
[facility] Patient Name: Kelly Wiley Procedure Date: 09/19/2021 10:14 AM MRN: [id number] Date of Birth: [birth year] Attending MD: Elon Alas. Abbey Chatters DO CSN: [id number] Age: 66 Admit Type: Outpatient Procedure:                Upper GI endoscopy Indications:              Abdominal pain in the left upper quadrant,                            Dysphagia, Early satiety Providers:                Elon Alas. Abbey Chatters, DO, Lurline Del, RN, Gwynneth Albright RN, RN, Randa Spike, Technician Referring MD:              Medicines:                See the Anesthesia note for documentation of the                            administered medications Complications:            No immediate complications. Estimated Blood Loss:     Estimated blood loss was minimal. Procedure:                Pre-Anesthesia Assessment:                           - The anesthesia plan was to use monitored                            anesthesia care (MAC).                           After obtaining informed consent, the endoscope was                            passed under direct vision. Throughout the                            procedure, the patient's blood pressure, pulse, and                            oxygen saturations were monitored continuously. The                            [id number]-H190 ([id number]) scope was introduced through the                            mouth, and advanced to the second part of duodenum.                            The upper GI endoscopy was accomplished without  difficulty. The patient tolerated the procedure                            well. Scope In: 10:47:26 AM Scope Out: 10:52:43 AM Total Procedure Duration: 0 hours 5 minutes 17 seconds  Findings:      A small hiatal hernia was present.      A mild Schatzki ring was found in the distal esophagus. A TTS dilator       was passed through the scope. Dilation with an 18-19-20 mm balloon        dilator was performed to 20 mm. The dilation site was examined and       showed moderate improvement in luminal narrowing.      Patchy mild inflammation characterized by erythema was found in the       gastric body. Biopsies were taken with a cold forceps for Helicobacter       pylori testing.      The duodenal bulb, first portion of the duodenum and second portion of       the duodenum were normal. Impression:               - Small hiatal hernia.                           - Mild Schatzki ring. Dilated.                           - Gastritis. Biopsied.                           - Normal duodenal bulb, first portion of the                            duodenum and second portion of the duodenum. Moderate Sedation:      Per Anesthesia Care Recommendation:           - Patient has a contact number available for                            emergencies. The signs and symptoms of potential                            delayed complications were discussed with the                            patient. Return to normal activities tomorrow.                            Written discharge instructions were provided to the                            patient.                           - Resume previous diet.                           - Continue present medications.                           -  Await pathology results.                           - Repeat upper endoscopy PRN for retreatment.                           - Return to GI clinic in 4 months. Procedure Code(s):        --- Professional ---                           [id number], Esophagogastroduodenoscopy, flexible,                            transoral; with transendoscopic balloon dilation of                            esophagus (less than 30 mm diameter)                           43239, 59, Esophagogastroduodenoscopy, flexible,                            transoral; with biopsy, single or multiple Diagnosis Code(s):        --- Professional ---                            K44.9, Diaphragmatic hernia without obstruction or                            gangrene                           K22.2, Esophageal obstruction                           K29.70, Gastritis, unspecified, without bleeding                           R10.12, Left upper quadrant pain                           R13.10, Dysphagia, unspecified                           R68.81, Early satiety CPT copyright 2019 American Medical Association. All rights reserved. The codes documented in this report are preliminary and upon coder review may  be revised to meet current compliance requirements. Elon Alas. Abbey Chatters, DO [employer] Larose Batres, DO 09/19/2021 10:55:08 AM This report has been signed electronically. Number of Addenda: 0

## 2021-09-19 NOTE — Op Note (Signed)
[facility] Patient Name: Kelly Wiley Procedure Date: 09/19/2021 10:55 AM MRN: [id number] Date of Birth: [birth year] Attending MD: Elon Alas. Abbey Chatters DO CSN: [id number] Age: 66 Admit Type: Outpatient Procedure:                Colonoscopy Indications:              Chronic diarrhea Providers:                Elon Alas. Abbey Chatters, DO, Lurline Del, RN, Everardo Pacific Referring MD:              Medicines:                See the Anesthesia note for documentation of the                            administered medications Complications:            No immediate complications. Estimated Blood Loss:     Estimated blood loss was minimal. Procedure:                Pre-Anesthesia Assessment:                           - The anesthesia plan was to use monitored                            anesthesia care (MAC).                           After obtaining informed consent, the colonoscope                            was passed under direct vision. Throughout the                            procedure, the patient's blood pressure, pulse, and                            oxygen saturations were monitored continuously. The                            PCF-HQ190L ([id number]) scope was introduced through                            the anus and advanced to the the cecum, identified                            by appendiceal orifice and ileocecal valve. The                            colonoscopy was performed without difficulty. The                            patient tolerated the procedure well. The quality  of the bowel preparation was evaluated using the                            BBPS [facility] Bowel Preparation Scale) with scores                            of: Right Colon = 3, Transverse Colon = 3 and Left                            Colon = 3 (entire mucosa seen well with no residual                            staining, small fragments of stool or opaque                             liquid). The total BBPS score equals 9. Scope In: 10:57:17 AM Scope Out: 11:19:52 AM Scope Withdrawal Time: 0 hours 13 minutes 59 seconds  Total Procedure Duration: 0 hours 22 minutes 35 seconds  Findings:      The perianal and digital rectal examinations were normal.      A 3 mm polyp was found in the ascending colon. The polyp was sessile.       The polyp was removed with a cold snare. Resection and retrieval were       complete.      A 5 mm polyp was found in the descending colon. The polyp was sessile.       The polyp was removed with a cold snare. Resection and retrieval were       complete.      Biopsies for histology were taken with a cold forceps from the ascending       colon, transverse colon and descending colon for evaluation of       microscopic colitis.      The exam was otherwise without abnormality. Impression:               - One 3 mm polyp in the ascending colon, removed                            with a cold snare. Resected and retrieved.                           - One 5 mm polyp in the descending colon, removed                            with a cold snare. Resected and retrieved.                           - The examination was otherwise normal.                           - Biopsies were taken with a cold forceps from the                            ascending colon, transverse colon and descending  colon for evaluation of microscopic colitis. Moderate Sedation:      Per Anesthesia Care Recommendation:           - Patient has a contact number available for                            emergencies. The signs and symptoms of potential                            delayed complications were discussed with the                            patient. Return to normal activities tomorrow.                            Written discharge instructions were provided to the                            patient.                           - Resume  previous diet.                           - Continue present medications.                           - Await pathology results.                           - Repeat colonoscopy in 5 years for surveillance.                           - Return to GI clinic in 4 months. Procedure Code(s):        --- Professional ---                           [phone], Colonoscopy, flexible; with removal of                            tumor(s), polyp(s), or other lesion(s) by snare                            technique                           45380, 37, Colonoscopy, flexible; with biopsy,                            single or multiple Diagnosis Code(s):        --- Professional ---                           K63.5, Polyp of colon                           K52.9, Noninfective gastroenteritis and colitis,  unspecified CPT copyright 2019 American Medical Association. All rights reserved. The codes documented in this report are preliminary and upon coder review may  be revised to meet current compliance requirements. Elon Alas. Abbey Chatters, DO [place] Abbey Chatters, DO 09/19/2021 11:22:29 AM This report has been signed electronically. Number of Addenda: 0

## 2021-09-19 NOTE — Anesthesia Postprocedure Evaluation (Signed)
Anesthesia Post Note  Patient: Elonda Giuliano House  Procedure(s) Performed: COLONOSCOPY WITH PROPOFOL ESOPHAGOGASTRODUODENOSCOPY (EGD) WITH PROPOFOL BALLOON DILATION BIOPSY  Patient location during evaluation: Phase II Anesthesia Type: General Level of consciousness: awake and alert and oriented Pain management: pain level controlled Vital Signs Assessment: post-procedure vital signs reviewed and stable Respiratory status: spontaneous breathing, nonlabored ventilation and respiratory function stable Cardiovascular status: blood pressure returned to baseline and stable Postop Assessment: no apparent nausea or vomiting Anesthetic complications: no   No notable events documented.   Last Vitals:  Vitals:   09/19/21 1027 09/19/21 1124  BP: (!) 167/90 (!) 95/50  Pulse: 83 69  Resp: 16 16  Temp: 36.7 C 36.7 C  SpO2: 95% 95%    Last Pain:  Vitals:   09/19/21 1124  TempSrc: Oral  PainSc: Asleep                 Dashiell Franchino C Searcy Miyoshi

## 2021-09-19 NOTE — Discharge Instructions (Addendum)
EGD Discharge instructions Please read the instructions outlined below and refer to this sheet in the next few weeks. These discharge instructions provide you with general information on caring for yourself after you leave the hospital. Your doctor may also give you specific instructions. While your treatment has been planned according to the most current medical practices available, unavoidable complications occasionally occur. If you have any problems or questions after discharge, please call your doctor. ACTIVITY You may resume your regular activity but move at a slower pace for the next 24 hours.  Take frequent rest periods for the next 24 hours.  Walking will help expel (get rid of) the air and reduce the bloated feeling in your abdomen.  No driving for 24 hours (because of the anesthesia (medicine) used during the test).  You may shower.  Do not sign any important legal documents or operate any machinery for 24 hours (because of the anesthesia used during the test).  NUTRITION Drink plenty of fluids.  You may resume your normal diet.  Begin with a light meal and progress to your normal diet.  Avoid alcoholic beverages for 24 hours or as instructed by your caregiver.  MEDICATIONS You may resume your normal medications unless your caregiver tells you otherwise.  WHAT YOU CAN EXPECT TODAY You may experience abdominal discomfort such as a feeling of fullness or "gas" pains.  FOLLOW-UP Your doctor will discuss the results of your test with you.  SEEK IMMEDIATE MEDICAL ATTENTION IF ANY OF THE FOLLOWING OCCUR: Excessive nausea (feeling sick to your stomach) and/or vomiting.  Severe abdominal pain and distention (swelling).  Trouble swallowing.  Temperature over 101 F (37.8 C).  Rectal bleeding or vomiting of blood.      Colonoscopy Discharge Instructions  Read the instructions outlined below and refer to this sheet in the next few weeks. These discharge instructions provide you  with general information on caring for yourself after you leave the hospital. Your doctor may also give you specific instructions. While your treatment has been planned according to the most current medical practices available, unavoidable complications occasionally occur.   ACTIVITY You may resume your regular activity, but move at a slower pace for the next 24 hours.  Take frequent rest periods for the next 24 hours.  Walking will help get rid of the air and reduce the bloated feeling in your belly (abdomen).  No driving for 24 hours (because of the medicine (anesthesia) used during the test).   Do not sign any important legal documents or operate any machinery for 24 hours (because of the anesthesia used during the test).  NUTRITION Drink plenty of fluids.  You may resume your normal diet as instructed by your doctor.  Begin with a light meal and progress to your normal diet. Heavy or fried foods are harder to digest and may make you feel sick to your stomach (nauseated).  Avoid alcoholic beverages for 24 hours or as instructed.  MEDICATIONS You may resume your normal medications unless your doctor tells you otherwise.  WHAT YOU CAN EXPECT TODAY Some feelings of bloating in the abdomen.  Passage of more gas than usual.  Spotting of blood in your stool or on the toilet paper.  IF YOU HAD POLYPS REMOVED DURING THE COLONOSCOPY: No aspirin products for 7 days or as instructed.  No alcohol for 7 days or as instructed.  Eat a soft diet for the next 24 hours.  FINDING OUT THE RESULTS OF YOUR TEST Not all test results  are available during your visit. If your test results are not back during the visit, make an appointment with your caregiver to find out the results. Do not assume everything is normal if you have not heard from your caregiver or the medical facility. It is important for you to follow up on all of your test results.  SEEK IMMEDIATE MEDICAL ATTENTION IF: You have more than a  spotting of blood in your stool.  Your belly is swollen (abdominal distention).  You are nauseated or vomiting.  You have a temperature over 101.  You have abdominal pain or discomfort that is severe or gets worse throughout the day.   Your EGD revealed mild amount inflammation in your stomach.  I took biopsies of this to rule out infection with a bacteria called H. pylori.  Await pathology results, my office will contact you. I did stretch your esophagus today. Hopefully this helps with your swallowing.   Your colonoscopy revealed 2 polyp(s) which I removed successfully. Await pathology results, my office will contact you. I recommend repeating colonoscopy in 5 years for surveillance purposes.   I took biopsies of your colon to rule out a condition called microscopic colitis which can cause chronic diarrhea.   Follow up with GI in 3-4 months.    I hope you have a great rest of your week!  Elon Alas. Abbey Chatters, D.O. Gastroenterology and Hepatology [facility]

## 2021-09-19 NOTE — H&P (Signed)
Primary Care Physician:  Lanelle Bal, PA-C Primary Gastroenterologist:  Dr. Abbey Chatters  Pre-Procedure History & Physical: HPI:  Kelly Wiley is a 66 y.o. female is here for an EGD with possible dilation for dysphagia, GERD, early satiety and colonoscopy to be performed for chronic diarrhea.   Past Medical History:  Diagnosis Date   Cervical spine disease    DM (diabetes mellitus) ([place])    Dysphagia    Fibromyalgia    GERD (gastroesophageal reflux disease)    HTN (hypertension)    Hypercholesteremia    PONV (postoperative nausea and vomiting)    Seasonal allergies    Seizures (H[facility])    last one > 20 years ago    Past Surgical History:  Procedure Laterality Date   BACK SURGERY     CARPAL TUNNEL RELEASE  RIGHT   CERVICAL SPINE SURGERY  06/19/2010   COLONOSCOPY  [place]   ESOPHAGOGASTRODUODENOSCOPY  01/24/2011   KZS:WFUXNATFT 2o TO CERVICAL PLATES IN PT'S NECK AND MILD ESO MOTILITY DISORDER/Mild gastritis   ESOPHAGOGASTRODUODENOSCOPY  03/03/2011   DDU:KGUR gastritis    FLEXIBLE SIGMOIDOSCOPY  01/24/2011   SLF: Internal Hemorrhoids   SAVORY DILATION  01/24/2011   Procedure: SAVORY DILATION;  Surgeon: Dorothyann Peng, MD;  Location: [facility];  Service: Endoscopy;  Laterality: N/A;   SHOULDER SURGERY  RIGHT   TUBAL LIGATION      Prior to Admission medications   Medication Sig Start Date End Date Taking? Authorizing Provider  aspirin-acetaminophen-caffeine (EXCEDRIN MIGRAINE) [phone] MG per tablet Take 1 tablet by mouth every 6 (six) hours as needed for headache.   Yes [provider]  carvedilol (COREG) 12.5 MG tablet TAKE (1) TABLET TWICE DAILY. 09/06/21  Yes Imogene Burn, PA-C  dimenhyDRINATE (MOTION SICKNESS RELIEF) 50 MG tablet Take 50 mg by mouth in the morning and at bedtime.   Yes [provider]  diphenhydrAMINE (BENADRYL) 25 MG tablet Take 25 mg by mouth daily as needed for allergies.   Yes [provider]  ezetimibe  (ZETIA) 10 MG tablet Take 10 mg by mouth daily. 04/25/21  Yes [provider]  HUMALOG KWIKPEN 100 UNIT/ML KwikPen 30 Units 2 (two) times daily before a meal. 05/20/21  Yes [provider]  hyoscyamine (LEVSIN) 0.125 MG tablet Take 0.125 mg by mouth every 4 (four) hours as needed for cramping.   Yes [provider]  pantoprazole (PROTONIX) 40 MG tablet Take 40 mg by mouth 2 (two) times daily. 01/19/15  Yes [provider]  potassium chloride SA (KLOR-CON M) 20 MEQ tablet Take 20 mEq by mouth daily.   Yes [provider]  SEMGLEE, YFGN, 100 UNIT/ML Pen Inject 28 Units into the skin 2 (two) times daily. 05/19/21  Yes [provider]  tiZANidine (ZANAFLEX) 4 MG tablet Take 4 mg by mouth at bedtime. 09/06/21  Yes [provider]  TYMLOS 3120 MCG/1.56ML SOPN Inject 0.8 mcg into the skin daily. 09/27/20  Yes [provider]  Vitamin D, Ergocalciferol, (DRISDOL) 1.25 MG (50000 UNIT) CAPS capsule Take 50,000 Units by mouth 2 (two) times a week. 12/31/20  Yes [provider]  amitriptyline (ELAVIL) 25 MG tablet Take 12.5 mg by mouth at bedtime. 04/11/21   [provider]  amoxicillin (AMOXIL) 250 MG capsule Take 250 mg by mouth 2 (two) times daily.    [provider]  polyethylene glycol-electrolytes (TRILYTE) 420 g solution Take 4,000 mLs by mouth as directed. 08/18/21  Eloise Harman, DO    Allergies as of 08/18/2021 - Review Complete 08/18/2021  Allergen Reaction Noted   Ativan [lorazepam]  07/05/2015   Ethosuximide Other (See Comments) 07/21/2010   Iodine 131 Other (See Comments) 07/21/2010   Phenytoin sodium extended Other (See Comments) 07/21/2010   Sulfa antibiotics Other (See Comments) 07/21/2010    Family History  Problem Relation Age of Onset   Lung cancer Mother    Heart disease Mother        Mitral valve disease   Congenital heart disease Son        has a PPM   Diabetes Son    Colon cancer Neg  Hx    Colon polyps Neg Hx     Social History   Socioeconomic History   Marital status: Widowed    Spouse name: Not on file   Number of children: Not on file   Years of education: Not on file   Highest education level: Not on file  Occupational History   Not on file  Tobacco Use   Smoking status: Former    Packs/day: 0.50    Years: 2.00    Total pack years: 1.00    Types: Cigarettes    Start date: 10/12/1966    Quit date: 10/11/1968    Years since quitting: 52.9   Smokeless tobacco: Never  Vaping Use   Vaping Use: Never used  Substance and Sexual Activity   Alcohol use: Yes    Alcohol/week: 0.0 standard drinks of alcohol    Comment: occasionally   Drug use: No   Sexual activity: Not on file  Other Topics Concern   Not on file  Social History Narrative   MARRIED, 2 KIDS, [place] 50 YO.   Son has DM, malabsorption   Works in Press photographer in [place]   EtOH: rare   No tobacco products   Social Determinants of Radio broadcast assistant Strain: Not on file  Food Insecurity: Not on file  Transportation Needs: Not on file  Physical Activity: Not on file  Stress: Not on file  Social Connections: Not on file  Intimate Partner Violence: Not on file    Review of Systems: See HPI, otherwise negative ROS  Physical Exam: Vital signs in last 24 hours:     General:   Alert,  Well-developed, well-nourished, pleasant and cooperative in NAD Head:  Normocephalic and atraumatic. Eyes:  Sclera clear, no icterus.   Conjunctiva pink. Ears:  Normal auditory acuity. Nose:  No deformity, discharge,  or lesions. Mouth:  No deformity or lesions, dentition normal. Neck:  Supple; no masses or thyromegaly. Lungs:  Clear throughout to auscultation.   No wheezes, crackles, or rhonchi. No acute distress. Heart:  Regular rate and rhythm; no murmurs, clicks, rubs,  or gallops. Abdomen:  Soft, nontender and nondistended. No masses, hepatosplenomegaly or hernias noted. Normal bowel  sounds, without guarding, and without rebound.   Msk:  Symmetrical without gross deformities. Normal posture. Extremities:  Without clubbing or edema. Neurologic:  Alert and  oriented x4;  grossly normal neurologically. Skin:  Intact without significant lesions or rashes. Cervical Nodes:  No significant cervical adenopathy. Psych:  Alert and cooperative. Normal mood and affect.  Impression/Plan: Kelly Wiley is here for an EGD with possible dilation for dysphagia, GERD, early satiety and colonoscopy to be performed for chronic diarrhea.   The risks of the procedure including infection, bleed, or perforation as well as benefits, limitations, alternatives and imponderables have been reviewed  with the patient. Questions have been answered. All parties agreeable.

## 2021-09-21 LAB — SURGICAL PATHOLOGY

## 2021-09-27 ENCOUNTER — Encounter (HOSPITAL_COMMUNITY): Payer: Self-pay | Admitting: Internal Medicine

## 2021-09-30 DIAGNOSIS — M170 Bilateral primary osteoarthritis of knee: Secondary | ICD-10-CM | POA: Diagnosis not present

## 2021-10-03 DIAGNOSIS — M961 Postlaminectomy syndrome, not elsewhere classified: Secondary | ICD-10-CM | POA: Diagnosis not present

## 2021-10-03 DIAGNOSIS — Z6834 Body mass index (BMI) 34.0-34.9, adult: Secondary | ICD-10-CM | POA: Diagnosis not present

## 2021-10-30 ENCOUNTER — Other Ambulatory Visit: Payer: Self-pay | Admitting: Physician Assistant

## 2021-11-01 DIAGNOSIS — R030 Elevated blood-pressure reading, without diagnosis of hypertension: Secondary | ICD-10-CM | POA: Diagnosis not present

## 2021-11-01 DIAGNOSIS — H00021 Hordeolum internum right upper eyelid: Secondary | ICD-10-CM | POA: Diagnosis not present

## 2021-11-01 DIAGNOSIS — R300 Dysuria: Secondary | ICD-10-CM | POA: Diagnosis not present

## 2021-11-01 DIAGNOSIS — Z6833 Body mass index (BMI) 33.0-33.9, adult: Secondary | ICD-10-CM | POA: Diagnosis not present

## 2021-11-07 DIAGNOSIS — M810 Age-related osteoporosis without current pathological fracture: Secondary | ICD-10-CM | POA: Diagnosis not present

## 2021-11-07 DIAGNOSIS — M5126 Other intervertebral disc displacement, lumbar region: Secondary | ICD-10-CM | POA: Diagnosis not present

## 2021-11-10 DIAGNOSIS — R81 Glycosuria: Secondary | ICD-10-CM | POA: Diagnosis not present

## 2021-11-10 DIAGNOSIS — N952 Postmenopausal atrophic vaginitis: Secondary | ICD-10-CM | POA: Diagnosis not present

## 2021-11-10 DIAGNOSIS — R800 Isolated proteinuria: Secondary | ICD-10-CM | POA: Diagnosis not present

## 2021-11-10 DIAGNOSIS — N3946 Mixed incontinence: Secondary | ICD-10-CM | POA: Diagnosis not present

## 2021-11-10 DIAGNOSIS — Z87440 Personal history of urinary (tract) infections: Secondary | ICD-10-CM | POA: Diagnosis not present

## 2021-11-10 DIAGNOSIS — R82998 Other abnormal findings in urine: Secondary | ICD-10-CM | POA: Diagnosis not present

## 2021-11-10 DIAGNOSIS — N958 Other specified menopausal and perimenopausal disorders: Secondary | ICD-10-CM | POA: Diagnosis not present

## 2021-11-11 DIAGNOSIS — M5020 Other cervical disc displacement, unspecified cervical region: Secondary | ICD-10-CM | POA: Insufficient documentation

## 2021-11-11 DIAGNOSIS — M545 Low back pain, unspecified: Secondary | ICD-10-CM | POA: Insufficient documentation

## 2021-11-11 DIAGNOSIS — M5137 Other intervertebral disc degeneration, lumbosacral region: Secondary | ICD-10-CM | POA: Diagnosis not present

## 2021-11-11 DIAGNOSIS — M5030 Other cervical disc degeneration, unspecified cervical region: Secondary | ICD-10-CM | POA: Insufficient documentation

## 2021-11-11 DIAGNOSIS — M5412 Radiculopathy, cervical region: Secondary | ICD-10-CM | POA: Insufficient documentation

## 2021-11-11 DIAGNOSIS — M4802 Spinal stenosis, cervical region: Secondary | ICD-10-CM | POA: Insufficient documentation

## 2021-11-11 DIAGNOSIS — M47816 Spondylosis without myelopathy or radiculopathy, lumbar region: Secondary | ICD-10-CM | POA: Diagnosis not present

## 2021-11-11 DIAGNOSIS — M5136 Other intervertebral disc degeneration, lumbar region: Secondary | ICD-10-CM | POA: Diagnosis not present

## 2021-11-11 DIAGNOSIS — M961 Postlaminectomy syndrome, not elsewhere classified: Secondary | ICD-10-CM | POA: Insufficient documentation

## 2021-11-11 DIAGNOSIS — M48061 Spinal stenosis, lumbar region without neurogenic claudication: Secondary | ICD-10-CM | POA: Insufficient documentation

## 2021-11-17 DIAGNOSIS — I10 Essential (primary) hypertension: Secondary | ICD-10-CM | POA: Diagnosis not present

## 2021-11-17 DIAGNOSIS — Z6834 Body mass index (BMI) 34.0-34.9, adult: Secondary | ICD-10-CM | POA: Diagnosis not present

## 2021-11-17 DIAGNOSIS — M5432 Sciatica, left side: Secondary | ICD-10-CM | POA: Diagnosis not present

## 2021-11-25 DIAGNOSIS — E7849 Other hyperlipidemia: Secondary | ICD-10-CM | POA: Diagnosis not present

## 2021-11-25 DIAGNOSIS — K2100 Gastro-esophageal reflux disease with esophagitis, without bleeding: Secondary | ICD-10-CM | POA: Diagnosis not present

## 2021-11-25 DIAGNOSIS — Z780 Asymptomatic menopausal state: Secondary | ICD-10-CM | POA: Diagnosis not present

## 2021-11-25 DIAGNOSIS — E559 Vitamin D deficiency, unspecified: Secondary | ICD-10-CM | POA: Diagnosis not present

## 2021-11-25 DIAGNOSIS — E538 Deficiency of other specified B group vitamins: Secondary | ICD-10-CM | POA: Diagnosis not present

## 2021-11-25 DIAGNOSIS — E1143 Type 2 diabetes mellitus with diabetic autonomic (poly)neuropathy: Secondary | ICD-10-CM | POA: Diagnosis not present

## 2021-11-25 DIAGNOSIS — E039 Hypothyroidism, unspecified: Secondary | ICD-10-CM | POA: Diagnosis not present

## 2021-11-25 DIAGNOSIS — M85852 Other specified disorders of bone density and structure, left thigh: Secondary | ICD-10-CM | POA: Diagnosis not present

## 2021-11-25 DIAGNOSIS — E876 Hypokalemia: Secondary | ICD-10-CM | POA: Diagnosis not present

## 2021-11-25 DIAGNOSIS — E1165 Type 2 diabetes mellitus with hyperglycemia: Secondary | ICD-10-CM | POA: Diagnosis not present

## 2021-11-25 DIAGNOSIS — R748 Abnormal levels of other serum enzymes: Secondary | ICD-10-CM | POA: Diagnosis not present

## 2021-11-28 DIAGNOSIS — M810 Age-related osteoporosis without current pathological fracture: Secondary | ICD-10-CM | POA: Diagnosis not present

## 2021-11-29 DIAGNOSIS — M545 Low back pain, unspecified: Secondary | ICD-10-CM | POA: Diagnosis not present

## 2021-11-29 DIAGNOSIS — Z0000 Encounter for general adult medical examination without abnormal findings: Secondary | ICD-10-CM | POA: Diagnosis not present

## 2021-11-29 DIAGNOSIS — I10 Essential (primary) hypertension: Secondary | ICD-10-CM | POA: Diagnosis not present

## 2021-11-29 DIAGNOSIS — Z23 Encounter for immunization: Secondary | ICD-10-CM | POA: Diagnosis not present

## 2021-11-29 DIAGNOSIS — E7849 Other hyperlipidemia: Secondary | ICD-10-CM | POA: Diagnosis not present

## 2021-11-29 DIAGNOSIS — G720 Drug-induced myopathy: Secondary | ICD-10-CM | POA: Diagnosis not present

## 2021-11-29 DIAGNOSIS — Z6834 Body mass index (BMI) 34.0-34.9, adult: Secondary | ICD-10-CM | POA: Diagnosis not present

## 2021-11-29 DIAGNOSIS — E1165 Type 2 diabetes mellitus with hyperglycemia: Secondary | ICD-10-CM | POA: Diagnosis not present

## 2021-12-01 ENCOUNTER — Other Ambulatory Visit (HOSPITAL_COMMUNITY): Payer: Self-pay

## 2021-12-01 ENCOUNTER — Other Ambulatory Visit: Payer: Self-pay

## 2021-12-01 DIAGNOSIS — M810 Age-related osteoporosis without current pathological fracture: Secondary | ICD-10-CM

## 2021-12-02 ENCOUNTER — Telehealth: Payer: Self-pay | Admitting: Pharmacy Technician

## 2021-12-02 NOTE — Telephone Encounter (Signed)
Auth Submission: NO AUTH NEEDED Payer: MEDICARE A&B Medication & CPT/J Code(s) submitted: Reclast (Zolendronic acid) R3202 Route of submission (phone, fax, portal):  Phone # Fax # Auth type: Buy/Bill Units/visits requested: X1 DOSE Reference number:  Approval from: 12/02/21 to 03/19/22

## 2021-12-05 DIAGNOSIS — M5432 Sciatica, left side: Secondary | ICD-10-CM | POA: Diagnosis not present

## 2021-12-05 DIAGNOSIS — Z6834 Body mass index (BMI) 34.0-34.9, adult: Secondary | ICD-10-CM | POA: Diagnosis not present

## 2021-12-08 ENCOUNTER — Ambulatory Visit: Payer: Medicare Other

## 2021-12-14 ENCOUNTER — Ambulatory Visit: Payer: Medicare Other

## 2021-12-19 ENCOUNTER — Ambulatory Visit (INDEPENDENT_AMBULATORY_CARE_PROVIDER_SITE_OTHER): Payer: Medicare Other

## 2021-12-19 VITALS — BP 185/100 | HR 65 | Temp 98.8°F | Resp 16 | Ht 62.0 in | Wt 197.2 lb

## 2021-12-19 DIAGNOSIS — M810 Age-related osteoporosis without current pathological fracture: Secondary | ICD-10-CM

## 2021-12-19 MED ORDER — DIPHENHYDRAMINE HCL 25 MG PO CAPS
25.0000 mg | ORAL_CAPSULE | Freq: Once | ORAL | Status: AC
  Administered 2021-12-19: 25 mg via ORAL
  Filled 2021-12-19: qty 1

## 2021-12-19 MED ORDER — SODIUM CHLORIDE 0.9 % IV SOLN: INTRAVENOUS | Status: DC

## 2021-12-19 MED ORDER — ZOLEDRONIC ACID 5 MG/100ML IV SOLN
5.0000 mg | Freq: Once | INTRAVENOUS | Status: AC
  Administered 2021-12-19: 5 mg via INTRAVENOUS
  Filled 2021-12-19: qty 100

## 2021-12-19 MED ORDER — ACETAMINOPHEN 325 MG PO TABS
650.0000 mg | ORAL_TABLET | Freq: Once | ORAL | Status: AC
  Administered 2021-12-19: 650 mg via ORAL
  Filled 2021-12-19: qty 2

## 2021-12-19 NOTE — Progress Notes (Signed)
Diagnosis: Osteoporosis  Provider:  Marshell Garfinkel MD  Procedure: Infusion  IV Type: Peripheral, IV Location: R Antecubital  Reclast (Zolendronic Acid), Dose: 5 mg  Infusion Start Time: 7948  Infusion Stop Time: 0165  Post Infusion IV Care: Observation period completed and Peripheral IV Discontinued  Discharge: Condition: Good, Destination: Home . AVS provided to patient.   Performed by:  Adelina Mings, LPN

## 2021-12-23 DIAGNOSIS — B9629 Other Escherichia coli [E. coli] as the cause of diseases classified elsewhere: Secondary | ICD-10-CM | POA: Diagnosis present

## 2021-12-23 DIAGNOSIS — E785 Hyperlipidemia, unspecified: Secondary | ICD-10-CM | POA: Diagnosis not present

## 2021-12-23 DIAGNOSIS — R112 Nausea with vomiting, unspecified: Secondary | ICD-10-CM | POA: Diagnosis present

## 2021-12-23 DIAGNOSIS — Z794 Long term (current) use of insulin: Secondary | ICD-10-CM | POA: Diagnosis not present

## 2021-12-23 DIAGNOSIS — A4150 Gram-negative sepsis, unspecified: Secondary | ICD-10-CM | POA: Diagnosis not present

## 2021-12-23 DIAGNOSIS — E869 Volume depletion, unspecified: Secondary | ICD-10-CM | POA: Diagnosis not present

## 2021-12-23 DIAGNOSIS — R509 Fever, unspecified: Secondary | ICD-10-CM | POA: Diagnosis present

## 2021-12-23 DIAGNOSIS — Z87440 Personal history of urinary (tract) infections: Secondary | ICD-10-CM | POA: Diagnosis not present

## 2021-12-23 DIAGNOSIS — R7881 Bacteremia: Secondary | ICD-10-CM | POA: Diagnosis present

## 2021-12-23 DIAGNOSIS — E119 Type 2 diabetes mellitus without complications: Secondary | ICD-10-CM | POA: Diagnosis present

## 2021-12-23 DIAGNOSIS — J8410 Pulmonary fibrosis, unspecified: Secondary | ICD-10-CM | POA: Diagnosis not present

## 2021-12-23 DIAGNOSIS — I10 Essential (primary) hypertension: Secondary | ICD-10-CM | POA: Diagnosis not present

## 2021-12-23 DIAGNOSIS — M797 Fibromyalgia: Secondary | ICD-10-CM | POA: Diagnosis present

## 2021-12-23 DIAGNOSIS — S32008A Other fracture of unspecified lumbar vertebra, initial encounter for closed fracture: Secondary | ICD-10-CM | POA: Diagnosis not present

## 2021-12-23 DIAGNOSIS — N390 Urinary tract infection, site not specified: Secondary | ICD-10-CM | POA: Diagnosis present

## 2021-12-23 DIAGNOSIS — Z9689 Presence of other specified functional implants: Secondary | ICD-10-CM | POA: Diagnosis not present

## 2021-12-23 DIAGNOSIS — R918 Other nonspecific abnormal finding of lung field: Secondary | ICD-10-CM | POA: Diagnosis not present

## 2021-12-23 DIAGNOSIS — B9620 Unspecified Escherichia coli [E. coli] as the cause of diseases classified elsewhere: Secondary | ICD-10-CM | POA: Diagnosis not present

## 2021-12-24 DIAGNOSIS — B9629 Other Escherichia coli [E. coli] as the cause of diseases classified elsewhere: Secondary | ICD-10-CM | POA: Diagnosis present

## 2021-12-24 DIAGNOSIS — E119 Type 2 diabetes mellitus without complications: Secondary | ICD-10-CM | POA: Diagnosis present

## 2021-12-24 DIAGNOSIS — N390 Urinary tract infection, site not specified: Secondary | ICD-10-CM | POA: Diagnosis present

## 2021-12-24 DIAGNOSIS — Z794 Long term (current) use of insulin: Secondary | ICD-10-CM | POA: Diagnosis not present

## 2021-12-24 DIAGNOSIS — E785 Hyperlipidemia, unspecified: Secondary | ICD-10-CM | POA: Diagnosis present

## 2021-12-24 DIAGNOSIS — I10 Essential (primary) hypertension: Secondary | ICD-10-CM | POA: Diagnosis present

## 2021-12-24 DIAGNOSIS — R112 Nausea with vomiting, unspecified: Secondary | ICD-10-CM | POA: Diagnosis present

## 2021-12-24 DIAGNOSIS — R509 Fever, unspecified: Secondary | ICD-10-CM | POA: Diagnosis present

## 2021-12-24 DIAGNOSIS — M797 Fibromyalgia: Secondary | ICD-10-CM | POA: Diagnosis present

## 2021-12-24 DIAGNOSIS — A4150 Gram-negative sepsis, unspecified: Secondary | ICD-10-CM | POA: Diagnosis not present

## 2021-12-24 DIAGNOSIS — Z87440 Personal history of urinary (tract) infections: Secondary | ICD-10-CM | POA: Diagnosis not present

## 2021-12-24 DIAGNOSIS — R7881 Bacteremia: Secondary | ICD-10-CM | POA: Diagnosis present

## 2021-12-24 DIAGNOSIS — B9620 Unspecified Escherichia coli [E. coli] as the cause of diseases classified elsewhere: Secondary | ICD-10-CM | POA: Diagnosis not present

## 2022-01-11 DIAGNOSIS — G8929 Other chronic pain: Secondary | ICD-10-CM | POA: Diagnosis not present

## 2022-01-11 DIAGNOSIS — R300 Dysuria: Secondary | ICD-10-CM | POA: Diagnosis not present

## 2022-01-11 DIAGNOSIS — M5441 Lumbago with sciatica, right side: Secondary | ICD-10-CM | POA: Diagnosis not present

## 2022-01-11 DIAGNOSIS — Z6832 Body mass index (BMI) 32.0-32.9, adult: Secondary | ICD-10-CM | POA: Diagnosis not present

## 2022-01-11 DIAGNOSIS — R030 Elevated blood-pressure reading, without diagnosis of hypertension: Secondary | ICD-10-CM | POA: Diagnosis not present

## 2022-01-11 DIAGNOSIS — M5442 Lumbago with sciatica, left side: Secondary | ICD-10-CM | POA: Diagnosis not present

## 2022-01-16 DIAGNOSIS — I10 Essential (primary) hypertension: Secondary | ICD-10-CM | POA: Diagnosis not present

## 2022-01-16 DIAGNOSIS — S2231XA Fracture of one rib, right side, initial encounter for closed fracture: Secondary | ICD-10-CM | POA: Diagnosis not present

## 2022-01-16 DIAGNOSIS — R1012 Left upper quadrant pain: Secondary | ICD-10-CM | POA: Diagnosis not present

## 2022-01-16 DIAGNOSIS — R110 Nausea: Secondary | ICD-10-CM | POA: Diagnosis not present

## 2022-01-16 DIAGNOSIS — Z87891 Personal history of nicotine dependence: Secondary | ICD-10-CM | POA: Diagnosis not present

## 2022-01-16 DIAGNOSIS — R109 Unspecified abdominal pain: Secondary | ICD-10-CM | POA: Diagnosis not present

## 2022-01-16 DIAGNOSIS — E785 Hyperlipidemia, unspecified: Secondary | ICD-10-CM | POA: Diagnosis not present

## 2022-01-16 DIAGNOSIS — E119 Type 2 diabetes mellitus without complications: Secondary | ICD-10-CM | POA: Diagnosis not present

## 2022-01-16 DIAGNOSIS — R531 Weakness: Secondary | ICD-10-CM | POA: Diagnosis not present

## 2022-01-16 DIAGNOSIS — R079 Chest pain, unspecified: Secondary | ICD-10-CM | POA: Diagnosis not present

## 2022-01-16 DIAGNOSIS — N390 Urinary tract infection, site not specified: Secondary | ICD-10-CM | POA: Diagnosis not present

## 2022-01-16 DIAGNOSIS — K8020 Calculus of gallbladder without cholecystitis without obstruction: Secondary | ICD-10-CM | POA: Diagnosis not present

## 2022-01-18 NOTE — Progress Notes (Unsigned)
GI Office Note    Referring Provider: Lanelle Bal, PA-C Primary Care Physician:  Lanelle Bal, PA-C Primary Gastroenterologist: Elon Alas. Abbey Chatters, DO  Date:  01/18/2022  ID:  Kelly Wiley, DOB 08-03-1955, MRN [id number]   Chief Complaint   No chief complaint on file.   History of Present Illness  Kelly Wiley is a 66 y.o. female with a history of diabetes, fibromyalgia, GERD, HTN, HLD, seizures, dysphagia, IBS, possible gastroparesis*** presenting today for follow up.   EGD December 2012 with dilation and gastritis negative for H. Pylori  Seen for initial consultation 08/18/21 for GERD, dysphagia, early satiety, diarrhea, gastroparesis, intermittent diarrhea, intermittent abdominal pain primarily LLQ. GERD well controlled with PPI. Reported solid food dysphagia that was worsening, getting stuck substernally. Early satiety noted and questionable diagnosis of gastroparesis not relieved by erythromycin. Diarrhea 3-4 times per week. Reportedly diagnosed with IBS years prior. Abdominal pain a few times per week (mild-moderate). Scheduled for EGD with dilation and TCS for random biopsies. Continue PPI. May trial reglan for gastroparesis, 6-8 small meals daily recommended.   EGD 09/19/21: - small hiatal hernia - mild schatzki ring s/p dilation - gastritis s/p biopsy - normal duodenum - biopsies negative for H. Pylori  Colonoscopy 09/19/21:  - 3 mm ascending polyp - 5 mm descending polyp - Pathology: descending polyp tubular adenoma and ascending polyp with lymphoid aggregate. Benign random biopsies - Repeat colonoscopy in 5 years  Recent ED visit at [facility] for concern of UTI. Reported sepsis at hospital in [place] 2 weeks prior. Endorsed flank pain and chronic abdominal pain. Labs 01/16/22: Hgb 15.8, MCV 87, Plt 233,  K 3.3, sodium 136, Cr 0.8, normal LFTs, Lipase wnl. CT A/P with hepatic density consistent with steatosis, elongated left hepatic lobe into LUQ, cholelithiasis without  cholecystitis, no biliary dilation, normal pancreas and spleen, renal scarring and non-obstructing left renal stones. Advised to see general surgery or PCP regarding [place]  Today GERD -   Gastroparesis? -   Constipation -   Abdominal pain -   Current Outpatient Medications  Medication Sig Dispense Refill   amitriptyline (ELAVIL) 25 MG tablet Take 12.5 mg by mouth at bedtime.     amoxicillin (AMOXIL) 250 MG capsule Take 250 mg by mouth 2 (two) times daily.     aspirin-acetaminophen-caffeine (EXCEDRIN MIGRAINE) 250-250-65 MG per tablet Take 1 tablet by mouth every 6 (six) hours as needed for headache.     carvedilol (COREG) 12.5 MG tablet TAKE (1) TABLET TWICE DAILY. 60 tablet 9   dimenhyDRINATE (MOTION SICKNESS RELIEF) 50 MG tablet Take 50 mg by mouth in the morning and at bedtime.     diphenhydrAMINE (BENADRYL) 25 MG tablet Take 25 mg by mouth daily as needed for allergies.     ezetimibe (ZETIA) 10 MG tablet Take 10 mg by mouth daily.     HUMALOG KWIKPEN 100 UNIT/ML KwikPen 30 Units 2 (two) times daily before a meal.     hyoscyamine (LEVSIN) 0.125 MG tablet Take 0.125 mg by mouth every 4 (four) hours as needed for cramping.     pantoprazole (PROTONIX) 40 MG tablet Take 40 mg by mouth 2 (two) times daily.     potassium chloride SA (KLOR-CON M) 20 MEQ tablet Take 20 mEq by mouth daily.     SEMGLEE, YFGN, 100 UNIT/ML Pen Inject 28 Units into the skin 2 (two) times daily.     tiZANidine (ZANAFLEX) 4 MG tablet Take 4 mg by mouth at bedtime.  TYMLOS 3120 MCG/1.56ML SOPN Inject 0.8 mcg into the skin daily.     Vitamin D, Ergocalciferol, (DRISDOL) 1.25 MG (50000 UNIT) CAPS capsule Take 50,000 Units by mouth 2 (two) times a week.     No current facility-administered medications for this visit.    Past Medical History:  Diagnosis Date   Cervical spine disease    DM (diabetes mellitus) ([place])    Dysphagia    Fibromyalgia    GERD (gastroesophageal reflux disease)    HTN (hypertension)     Hypercholesteremia    PONV (postoperative nausea and vomiting)    Seasonal allergies    Seizures ([place])    last one > 20 years ago    Past Surgical History:  Procedure Laterality Date   BACK SURGERY     BALLOON DILATION N/A 09/19/2021   Procedure: BALLOON DILATION;  Surgeon: Eloise Harman, DO;  [facility];  Service: Endoscopy;  Laterality: N/A;   BIOPSY  09/19/2021   Procedure: BIOPSY;  Surgeon: Eloise Harman, DO;  [facility];  Service: Endoscopy;;  gastric;random colon;   CARPAL TUNNEL RELEASE  RIGHT   CERVICAL SPINE SURGERY  06/19/2010   COLONOSCOPY  2008 [facility]    FLEISCHMAN NL   COLONOSCOPY WITH PROPOFOL N/A 09/19/2021   Procedure: COLONOSCOPY WITH PROPOFOL;  Surgeon: Eloise Harman, DO;  [facility];  Service: Endoscopy;  Laterality: N/A;  12:00pm   ESOPHAGOGASTRODUODENOSCOPY  01/24/2011   NWG:NFAOZHYQM 2o TO CERVICAL PLATES IN PT'S NECK AND MILD ESO MOTILITY DISORDER/Mild gastritis   ESOPHAGOGASTRODUODENOSCOPY  03/03/2011   VHQ:IONG gastritis    ESOPHAGOGASTRODUODENOSCOPY (EGD) WITH PROPOFOL N/A 09/19/2021   Procedure: ESOPHAGOGASTRODUODENOSCOPY (EGD) WITH PROPOFOL;  Surgeon: Eloise Harman, DO;  [facility];  Service: Endoscopy;  Laterality: N/A;   FLEXIBLE SIGMOIDOSCOPY  01/24/2011   SLF: Internal Hemorrhoids   SAVORY DILATION  01/24/2011   Procedure: SAVORY DILATION;  Surgeon: Dorothyann Peng, MD;  [facility];  Service: Endoscopy;  Laterality: N/A;   SHOULDER SURGERY  RIGHT   TUBAL LIGATION      Family History  Problem Relation Age of Onset   Lung cancer Mother    Heart disease Mother        Mitral valve disease   Congenital heart disease Son        has a PPM   Diabetes Son    Colon cancer Neg Hx    Colon polyps Neg Hx     Allergies as of 01/24/2022 - Review Complete 09/19/2021  Allergen Reaction Noted   Ativan [lorazepam]  07/05/2015   Iodine 131 Other (See Comments) 07/21/2010   Phenytoin  sodium extended Other (See Comments) 07/21/2010   Sulfa antibiotics Other (See Comments) 07/21/2010   Zarontin [ethosuximide]  09/09/2021    Social History   Socioeconomic History   Marital status: Widowed    Spouse name: Not on file   Number of children: Not on file   Years of education: Not on file   Highest education level: Not on file  Occupational History   Not on file  Tobacco Use   Smoking status: Former    Packs/day: 0.50    Years: 2.00    Total pack years: 1.00    Types: Cigarettes    Start date: 10/12/1966    Quit date: 10/11/1968    Years since quitting: 53.3   Smokeless tobacco: Never  Vaping Use   Vaping Use: Never used  Substance and  Sexual Activity   Alcohol use: Yes    Alcohol/week: 0.0 standard drinks of alcohol    Comment: occasionally   Drug use: No   Sexual activity: Not on file  Other Topics Concern   Not on file  Social History Narrative   MARRIED, 2 KIDS, [place] 20 YO.   Son has DM, malabsorption   Works in Press photographer in [place]   EtOH: rare   No tobacco products   Social Determinants of Radio broadcast assistant Strain: Not on file  Food Insecurity: Not on file  Transportation Needs: Not on file  Physical Activity: Not on file  Stress: Not on file  Social Connections: Not on file     Review of Systems   Gen: Denies fever, chills, anorexia. Denies fatigue, weakness, weight loss.  CV: Denies chest pain, palpitations, syncope, peripheral edema, and claudication. Resp: Denies dyspnea at rest, cough, wheezing, coughing up blood, and pleurisy. GI: See HPI Derm: Denies rash, itching, dry skin Psych: Denies depression, anxiety, memory loss, confusion. No homicidal or suicidal ideation.  Heme: Denies bruising, bleeding, and enlarged lymph nodes.   Physical Exam   There were no vitals taken for this visit.  General:   Alert and oriented. No distress noted. Pleasant and cooperative.  Head:  Normocephalic and atraumatic. Eyes:   Conjuctiva clear without scleral icterus. Mouth:  Oral mucosa pink and moist. Good dentition. No lesions. Lungs:  Clear to auscultation bilaterally. No wheezes, rales, or rhonchi. No distress.  Heart:  S1, S2 present without murmurs appreciated.  Abdomen:  +BS, soft, non-tender and non-distended. No rebound or guarding. No HSM or masses noted. Rectal: *** Msk:  Symmetrical without gross deformities. Normal posture. Extremities:  Without edema. Neurologic:  Alert and  oriented x4 Psych:  Alert and cooperative. Normal mood and affect.   Assessment  Kelly Wiley is a 66 y.o. female with a history of diabetes, fibromyalgia, GERD, HTN, HLD, seizures, dysphagia, IBS, possible gastroparesis*** presenting today for follow up.  Abdominal pain: Recent CT A/P with cholelithiasis without cholecystitis. Normal LFTs and lipase.   Diarrhea:  GERD:   Gastroparesis:   PLAN   *** GES? Trial Reglan 5 mg TID? Abdominal US Celiac serologies, TSH     Venetia Night, MSN, FNP-BC, AGACNP-BC [facility]

## 2022-01-19 DIAGNOSIS — K8020 Calculus of gallbladder without cholecystitis without obstruction: Secondary | ICD-10-CM | POA: Insufficient documentation

## 2022-01-19 DIAGNOSIS — R110 Nausea: Secondary | ICD-10-CM | POA: Diagnosis not present

## 2022-01-24 ENCOUNTER — Ambulatory Visit (INDEPENDENT_AMBULATORY_CARE_PROVIDER_SITE_OTHER): Payer: Medicare Other | Admitting: Gastroenterology

## 2022-01-24 ENCOUNTER — Encounter: Payer: Self-pay | Admitting: Gastroenterology

## 2022-01-24 VITALS — BP 166/92 | HR 70 | Temp 97.7°F | Ht 62.0 in | Wt 184.2 lb

## 2022-01-24 DIAGNOSIS — R1319 Other dysphagia: Secondary | ICD-10-CM

## 2022-01-24 DIAGNOSIS — K219 Gastro-esophageal reflux disease without esophagitis: Secondary | ICD-10-CM | POA: Diagnosis not present

## 2022-01-24 DIAGNOSIS — R110 Nausea: Secondary | ICD-10-CM

## 2022-01-24 DIAGNOSIS — R6881 Early satiety: Secondary | ICD-10-CM

## 2022-01-24 NOTE — Patient Instructions (Addendum)
Continue taking your pantoprazole 40 mg twice daily and famotidine 20 mg twice daily.  Instead of taking both of these together you may try spacing them out and taking the famotidine prior to lunch and prior to bedtime.  Take your pantoprazole 40 mg 30 minutes prior to breakfast and 30 minutes prior to dinner.  Follow a GERD diet:  Avoid fried, fatty, greasy, spicy, citrus foods as these seem particularly trigger some for you Avoid caffeine and carbonated beverages. Avoid chocolate. Try eating 4-6 small meals a day rather than 3 large meals. Do not eat within 3 hours of laying down. Prop head of bed up on wood or bricks to create a 6 inch incline.  Continue a more bland diet.  Broiling, boiling, baking or grilling any meats.  Try keeping a symptom log of time that you eat, what you eat, and your symptoms within a few hours after.  I want you to begin Zofran 8 mg up to 3 times a day as needed for your nausea. We will follow-up on your upcoming ultrasound and if negative we may do a repeat gastric emptying study and trial a low-dose of Reglan to see if this improves your nausea.  Follow up in 2 months or sooner if needed.   It was a pleasure to see you today. I want to create trusting relationships with patients. If you receive a survey regarding your visit,  I greatly appreciate you taking time to fill this out on paper or through your MyChart. I value your feedback.  Venetia Night, MSN, FNP-BC, AGACNP-BC [facility]

## 2022-01-25 ENCOUNTER — Encounter: Payer: Self-pay | Admitting: Gastroenterology

## 2022-01-25 DIAGNOSIS — R300 Dysuria: Secondary | ICD-10-CM | POA: Diagnosis not present

## 2022-02-01 ENCOUNTER — Telehealth: Payer: Self-pay | Admitting: Gastroenterology

## 2022-02-01 NOTE — Telephone Encounter (Signed)
Please reach out to patient to notify us where her RUQ U/S was performed and please request copy of report.   Thank you, Venetia Night, MSN, APRN, FNP-BC, AGACNP-BC [facility]

## 2022-02-02 DIAGNOSIS — R81 Glycosuria: Secondary | ICD-10-CM | POA: Diagnosis not present

## 2022-02-02 DIAGNOSIS — N390 Urinary tract infection, site not specified: Secondary | ICD-10-CM | POA: Diagnosis not present

## 2022-02-02 DIAGNOSIS — N200 Calculus of kidney: Secondary | ICD-10-CM | POA: Diagnosis not present

## 2022-02-02 DIAGNOSIS — N3289 Other specified disorders of bladder: Secondary | ICD-10-CM | POA: Diagnosis not present

## 2022-02-02 DIAGNOSIS — R800 Isolated proteinuria: Secondary | ICD-10-CM | POA: Diagnosis not present

## 2022-02-02 DIAGNOSIS — R82998 Other abnormal findings in urine: Secondary | ICD-10-CM | POA: Diagnosis not present

## 2022-02-06 DIAGNOSIS — M5430 Sciatica, unspecified side: Secondary | ICD-10-CM | POA: Diagnosis not present

## 2022-02-06 DIAGNOSIS — M5126 Other intervertebral disc displacement, lumbar region: Secondary | ICD-10-CM | POA: Diagnosis not present

## 2022-02-17 DIAGNOSIS — M170 Bilateral primary osteoarthritis of knee: Secondary | ICD-10-CM | POA: Diagnosis not present

## 2022-02-17 DIAGNOSIS — R030 Elevated blood-pressure reading, without diagnosis of hypertension: Secondary | ICD-10-CM | POA: Diagnosis not present

## 2022-02-17 DIAGNOSIS — M5432 Sciatica, left side: Secondary | ICD-10-CM | POA: Diagnosis not present

## 2022-02-17 DIAGNOSIS — Z6833 Body mass index (BMI) 33.0-33.9, adult: Secondary | ICD-10-CM | POA: Diagnosis not present

## 2022-02-22 DIAGNOSIS — M4726 Other spondylosis with radiculopathy, lumbar region: Secondary | ICD-10-CM | POA: Diagnosis not present

## 2022-02-22 DIAGNOSIS — M5136 Other intervertebral disc degeneration, lumbar region: Secondary | ICD-10-CM | POA: Diagnosis not present

## 2022-02-22 DIAGNOSIS — M961 Postlaminectomy syndrome, not elsewhere classified: Secondary | ICD-10-CM | POA: Diagnosis not present

## 2022-02-24 DIAGNOSIS — M170 Bilateral primary osteoarthritis of knee: Secondary | ICD-10-CM | POA: Diagnosis not present

## 2022-03-03 DIAGNOSIS — M170 Bilateral primary osteoarthritis of knee: Secondary | ICD-10-CM | POA: Diagnosis not present

## 2022-03-14 DIAGNOSIS — E1165 Type 2 diabetes mellitus with hyperglycemia: Secondary | ICD-10-CM | POA: Diagnosis not present

## 2022-03-14 DIAGNOSIS — R300 Dysuria: Secondary | ICD-10-CM | POA: Diagnosis not present

## 2022-03-14 DIAGNOSIS — Z6833 Body mass index (BMI) 33.0-33.9, adult: Secondary | ICD-10-CM | POA: Diagnosis not present

## 2022-03-14 DIAGNOSIS — R030 Elevated blood-pressure reading, without diagnosis of hypertension: Secondary | ICD-10-CM | POA: Diagnosis not present

## 2022-03-23 DIAGNOSIS — E1165 Type 2 diabetes mellitus with hyperglycemia: Secondary | ICD-10-CM | POA: Diagnosis not present

## 2022-03-23 DIAGNOSIS — R748 Abnormal levels of other serum enzymes: Secondary | ICD-10-CM | POA: Diagnosis not present

## 2022-03-23 DIAGNOSIS — E559 Vitamin D deficiency, unspecified: Secondary | ICD-10-CM | POA: Diagnosis not present

## 2022-03-23 DIAGNOSIS — E7849 Other hyperlipidemia: Secondary | ICD-10-CM | POA: Diagnosis not present

## 2022-03-23 DIAGNOSIS — E039 Hypothyroidism, unspecified: Secondary | ICD-10-CM | POA: Diagnosis not present

## 2022-03-23 DIAGNOSIS — R5383 Other fatigue: Secondary | ICD-10-CM | POA: Diagnosis not present

## 2022-03-23 DIAGNOSIS — I10 Essential (primary) hypertension: Secondary | ICD-10-CM | POA: Diagnosis not present

## 2022-03-27 ENCOUNTER — Ambulatory Visit: Payer: Medicare Other | Admitting: Gastroenterology

## 2022-03-28 ENCOUNTER — Ambulatory Visit: Payer: Medicare Other | Admitting: Gastroenterology

## 2022-03-28 DIAGNOSIS — G720 Drug-induced myopathy: Secondary | ICD-10-CM | POA: Diagnosis not present

## 2022-03-28 DIAGNOSIS — E7849 Other hyperlipidemia: Secondary | ICD-10-CM | POA: Diagnosis not present

## 2022-03-28 DIAGNOSIS — M545 Low back pain, unspecified: Secondary | ICD-10-CM | POA: Diagnosis not present

## 2022-03-28 DIAGNOSIS — Z6834 Body mass index (BMI) 34.0-34.9, adult: Secondary | ICD-10-CM | POA: Diagnosis not present

## 2022-03-28 DIAGNOSIS — E782 Mixed hyperlipidemia: Secondary | ICD-10-CM | POA: Diagnosis not present

## 2022-03-28 DIAGNOSIS — E1165 Type 2 diabetes mellitus with hyperglycemia: Secondary | ICD-10-CM | POA: Diagnosis not present

## 2022-03-28 DIAGNOSIS — R300 Dysuria: Secondary | ICD-10-CM | POA: Diagnosis not present

## 2022-03-28 DIAGNOSIS — I10 Essential (primary) hypertension: Secondary | ICD-10-CM | POA: Diagnosis not present

## 2022-04-06 DIAGNOSIS — Z1330 Encounter for screening examination for mental health and behavioral disorders, unspecified: Secondary | ICD-10-CM | POA: Diagnosis not present

## 2022-04-06 DIAGNOSIS — M47816 Spondylosis without myelopathy or radiculopathy, lumbar region: Secondary | ICD-10-CM | POA: Diagnosis not present

## 2022-04-06 DIAGNOSIS — M961 Postlaminectomy syndrome, not elsewhere classified: Secondary | ICD-10-CM | POA: Diagnosis not present

## 2022-04-06 DIAGNOSIS — M2550 Pain in unspecified joint: Secondary | ICD-10-CM | POA: Diagnosis not present

## 2022-04-11 DIAGNOSIS — R300 Dysuria: Secondary | ICD-10-CM | POA: Diagnosis not present

## 2022-04-19 DIAGNOSIS — I10 Essential (primary) hypertension: Secondary | ICD-10-CM | POA: Diagnosis not present

## 2022-04-19 DIAGNOSIS — R300 Dysuria: Secondary | ICD-10-CM | POA: Diagnosis not present

## 2022-04-19 DIAGNOSIS — Z6834 Body mass index (BMI) 34.0-34.9, adult: Secondary | ICD-10-CM | POA: Diagnosis not present

## 2022-04-19 NOTE — Progress Notes (Unsigned)
GI Office Note    Referring Provider: Lanelle Bal, PA-C Primary Care Physician:  Lanelle Bal, PA-C Primary Gastroenterologist: Elon Alas. Abbey Chatters, DO  Date:  04/20/2022  ID:  Kelly Wiley, DOB 04/06/1955, MRN [id number]   Chief Complaint   Chief Complaint  Patient presents with   Follow-up    Diarrhea and gas for about a month. Nauseated and stomach ache.      History of Present Illness  Kelly Wiley is a 67 y.o. female with a history of diabetes, fibromyalgia, GERD, HTN, HLD, seizures, dysphagia, IBS, and chronic nausea presenting today for follow up.   GES performed at [facility] in 2015 with normal emptying at 2 hours and 4 hours.   EGD December 2012 with dilation and gastritis negative for H. Pylori.   EGD 09/19/21: - small hiatal hernia - mild schatzki ring s/p dilation - gastritis s/p biopsy - normal duodenum - biopsies negative for H. Pylori   Colonoscopy 09/19/21:  - 3 mm ascending polyp - 5 mm descending polyp - Pathology: descending polyp tubular adenoma and ascending polyp with lymphoid aggregate. Benign random biopsies - Repeat colonoscopy in 5 years   Recent ED visit at [facility] 01/16/22 for concern of UTI. Reported sepsis at hospital in [place] 2 weeks prior. Endorsed flank pain and chronic abdominal pain. Labs 01/16/22: Hgb 15.8, MCV 87, Plt 233,  K 3.3, sodium 136, Cr 0.8, normal LFTs, Lipase wnl. CT A/P with hepatic density consistent with steatosis, elongated left hepatic lobe into LUQ, cholelithiasis without cholecystitis, no biliary dilation, normal pancreas and spleen, renal scarring and non-obstructing left renal stones. Advised to see general surgery or PCP regarding [place].   Seen Dr. Rocco Serene 01/19/2022 regarding nausea and bloating.  She suspected her symptoms were not classic for biliary colic and that may be complicated by her UTI.  She ordered a dedicated right upper quadrant ultrasound and advised to follow-up thereafter.  Last office visit 01/24/22.  Patient suspected nausea related to reclast injection. Reported ed visit in [place] with fever and diagnosed with ecoli bacteremia and ecoli uti. Recently doubled ppi and added famotidine. Worsening of her chronic nausea. Constant abdominal fullness sensation. Rare vomiting. Lack of energy. Reported 10 lb weight loss in 1 month. Reported symptoms came in cycles. Nausea worse in the mornings. BM once to twice per day. Due to have RUQ [place] in near future. Continue ppi bid and famotidine twice daily for breakthrough. Advised smaller more frequent meals. Advised to continue zofran up to TID as needed. May consider trial of reglan.   No record of RUQ [place] per review of care everywhere.   Today: GERD: No indigestion. Has been having trouble swallowing. Didn't have as much relief as in the past. Sometimes feels like food gets stuck in the back of her throat. Worse with food and food like bread.   Nausea, early satiety: Nausea had gotten better. Having trouble staying hydrated and was drinking body armour and that improved. Also thinks at this time she has a uti so that may be contributing. Not losing weight. Appetite is so/so. If hse waits until she's hungry she wouldn't eat much. Does not feel hungry lots of the time. Has constant fullness sensation. Reports she has tried erythromycin and Reglan in the past. Erythromycin did not work well. Right now just using dramamine over the counter. Dramamine working just as well as zofran and phenergan. Using dramamine twice per day.   Diarrhea: Has been having a lot of gas lately and  some increased urgency recently. Has a hard time eating meat. Has been having frequent UTIs. Lately having 3-4 looser stools daily. Sometimes when she eats certain fibrous vegetables that this affects her diarrhea. Gas causes pain at times. When going to the bathroom she felt like she was going to pass out but this was one instance. At times with her urgency she is not able to but once she goes  she has a big bowel movement. Only takes levsin as needed.Does not take on a daily basis. Cramping/pain usually lower abdomen and improved after defecation. Usually uses it at least once a week for the last month but prior to that she had not used it in a while. Right after she eats she needs to have a bm.   RUQ [place]: never did ultrasound due to her insurance being out of network and then her insurance recently changed.   Last couple days her BP has been quite high. No chest pain. No edema. No shortness of breath.   Current Outpatient Medications  Medication Sig Dispense Refill   carvedilol (COREG) 12.5 MG tablet TAKE (1) TABLET TWICE DAILY. 60 tablet 9   cloNIDine (CATAPRES) 0.1 MG tablet Take 0.1 mg by mouth at bedtime.     diclofenac (VOLTAREN) 75 MG EC tablet Take by mouth.     estradiol (ESTRACE) 0.1 MG/GM vaginal cream Place vaginally.     ezetimibe (ZETIA) 10 MG tablet Take 10 mg by mouth daily.     famotidine (PEPCID) 20 MG tablet Take 20 mg by mouth 2 (two) times daily.     fluconazole (DIFLUCAN) 150 MG tablet Take 150 mg by mouth once.     HUMALOG KWIKPEN 100 UNIT/ML KwikPen 30 Units 2 (two) times daily before a meal.     HYDROcodone-acetaminophen (NORCO) 10-325 MG tablet Take 1 tablet by mouth 2 (two) times daily as needed.     nitrofurantoin, macrocrystal-monohydrate, (MACROBID) 100 MG capsule Take 100 mg by mouth 2 (two) times daily.     pantoprazole (PROTONIX) 40 MG tablet Take 40 mg by mouth 2 (two) times daily.     SEMGLEE, YFGN, 100 UNIT/ML Pen Inject 28 Units into the skin 2 (two) times daily.     tiZANidine (ZANAFLEX) 4 MG tablet Take 4 mg by mouth at bedtime.     Vitamin D, Ergocalciferol, (DRISDOL) 1.25 MG (50000 UNIT) CAPS capsule Take 50,000 Units by mouth 2 (two) times a week.     cephALEXin (KEFLEX) 500 MG capsule Take by mouth. (Patient not taking: Reported on 04/20/2022)     dimenhyDRINATE (MOTION SICKNESS RELIEF) 50 MG tablet Take 50 mg by mouth in the morning and at  bedtime. (Patient not taking: Reported on 04/20/2022)     gemfibrozil (LOPID) 600 MG tablet Take 600 mg by mouth 2 (two) times daily.     hyoscyamine (LEVSIN) 0.125 MG tablet Take 0.125 mg by mouth every 4 (four) hours as needed for cramping.     methocarbamol (ROBAXIN) 500 MG tablet Take 500 mg by mouth 2 (two) times daily. (Patient not taking: Reported on 04/20/2022)     ondansetron (ZOFRAN-ODT) 4 MG disintegrating tablet Take 4 mg by mouth every 8 (eight) hours as needed. (Patient not taking: Reported on 04/20/2022)     pregabalin (LYRICA) 50 MG capsule Take 50 mg by mouth 2 (two) times daily. (Patient not taking: Reported on 04/20/2022)     promethazine (PHENERGAN) 25 MG tablet Take 25 mg by mouth every 6 (six) hours as needed. (  Patient not taking: Reported on 04/20/2022)     No current facility-administered medications for this visit.    Past Medical History:  Diagnosis Date   Cervical spine disease    DM (diabetes mellitus) ([place])    Dysphagia    Fibromyalgia    GERD (gastroesophageal reflux disease)    HTN (hypertension)    Hypercholesteremia    PONV (postoperative nausea and vomiting)    Seasonal allergies    Seizures ([place])    last one > 20 years ago    Past Surgical History:  Procedure Laterality Date   BACK SURGERY     BALLOON DILATION N/A 09/19/2021   Procedure: BALLOON DILATION;  Surgeon: Eloise Harman, DO;  [facility];  Service: Endoscopy;  Laterality: N/A;   BIOPSY  09/19/2021   Procedure: BIOPSY;  Surgeon: Eloise Harman, DO;  [facility];  Service: Endoscopy;;  gastric;random colon;   CARPAL TUNNEL RELEASE  RIGHT   CERVICAL SPINE SURGERY  06/19/2010   COLONOSCOPY  [facility]    [place]   COLONOSCOPY WITH PROPOFOL N/A 09/19/2021   Procedure: COLONOSCOPY WITH PROPOFOL;  Surgeon: Eloise Harman, DO;  [facility];  Service: Endoscopy;  Laterality: N/A;  12:00pm   ESOPHAGOGASTRODUODENOSCOPY  01/24/2011   UK[facility]:URKYHCWCB 2o TO CERVICAL  PLATES IN PT'S NECK AND MILD ESO MOTILITY DISORDER/Mild gastritis   ESOPHAGOGASTRODUODENOSCOPY  03/03/2011   JSE:GBTD gastritis    ESOPHAGOGASTRODUODENOSCOPY (EGD) WITH PROPOFOL N/A 09/19/2021   Procedure: ESOPHAGOGASTRODUODENOSCOPY (EGD) WITH PROPOFOL;  Surgeon: Eloise Harman, DO;  [facility];  Service: Endoscopy;  Laterality: N/A;   FLEXIBLE SIGMOIDOSCOPY  01/24/2011   SLF: Internal Hemorrhoids   SAVORY DILATION  01/24/2011   Procedure: SAVORY DILATION;  Surgeon: Dorothyann Peng, MD;  [facility];  Service: Endoscopy;  Laterality: N/A;   SHOULDER SURGERY  RIGHT   TUBAL LIGATION      Family History  Problem Relation Age of Onset   Lung cancer Mother    Heart disease Mother        Mitral valve disease   Congenital heart disease Son        has a PPM   Diabetes Son    Colon cancer Neg Hx    Colon polyps Neg Hx     Allergies as of 04/20/2022 - Review Complete 04/20/2022  Allergen Reaction Noted   Ativan [lorazepam]  07/05/2015   Iodine 131 Other (See Comments) 07/21/2010   Phenytoin sodium extended Other (See Comments) 07/21/2010   Sulfa antibiotics Other (See Comments) 07/21/2010   Zarontin [ethosuximide]  09/09/2021    Social History   Socioeconomic History   Marital status: Widowed    Spouse name: Not on file   Number of children: Not on file   Years of education: Not on file   Highest education level: Not on file  Occupational History   Not on file  Tobacco Use   Smoking status: Former    Packs/day: 0.50    Years: 2.00    Total pack years: 1.00    Types: Cigarettes    Start date: 10/12/1966    Quit date: 10/11/1968    Years since quitting: 53.5   Smokeless tobacco: Never  Vaping Use   Vaping Use: Never used  Substance and Sexual Activity   Alcohol use: Yes    Alcohol/week: 0.0 standard drinks of alcohol    Comment: occasionally   Drug use: No   Sexual activity:  Not on file  Other Topics Concern   Not on file  Social History  Narrative   MARRIED, 2 KIDS, YOUNGEST 26 YO.   Son has DM, malabsorption   Works in Press photographer in [place]   EtOH: rare   No tobacco products   Social Determinants of Radio broadcast assistant Strain: Not on file  Food Insecurity: Not on file  Transportation Needs: Not on file  Physical Activity: Not on file  Stress: Not on file  Social Connections: Not on file     Review of Systems   Gen: Denies fever, chills, anorexia. Denies fatigue, weakness, weight loss.  CV: Denies chest pain, palpitations, syncope, peripheral edema, and claudication. Resp: Denies dyspnea at rest, cough, wheezing, coughing up blood, and pleurisy. GI: See HPI Derm: Denies rash, itching, dry skin Psych: Denies depression, anxiety, memory loss, confusion. No homicidal or suicidal ideation.  Heme: Denies bruising, bleeding, and enlarged lymph nodes.   Physical Exam   BP (!) 189/104 (BP Location: Right Arm)   Pulse 64   Temp (!) 96.9 F (36.1 C) (Temporal)   Ht '5\' 2"'$  (1.575 m)   Wt 191 lb 6.4 oz (86.8 kg)   SpO2 97%   BMI 35.01 kg/m   General:   Alert and oriented. No distress noted. Pleasant and cooperative.  Head:  Normocephalic and atraumatic. Eyes:  Conjuctiva clear without scleral icterus. Mouth:  Oral mucosa pink and moist. Good dentition. No lesions. Lungs:  Clear to auscultation bilaterally. No wheezes, rales, or rhonchi. No distress.  Heart:  S1, S2 present without murmurs appreciated.  Abdomen:  +BS, soft, non-tender and non-distended. No rebound or guarding. No HSM or masses noted. Rectal: deferred Msk:  Symmetrical without gross deformities. Normal posture. Extremities:  Without edema. Neurologic:  Alert and  oriented x4 Psych:  Alert and cooperative. Normal mood and affect.   Assessment  Kelly Wiley is a 67 y.o. female with a history of diabetes, fibromyalgia, GERD, HTN, HLD, seizures, dysphagia, IBS, and chronic nausea presenting today for follow up.   GERD, Dysphagia:  EGD in July 2023 with gastritis.  Prior history of esophageal manometry with pH impedance testing in December 2016 with hypertensive LES with appropriate relaxation, absent esophageal peristalsis, and presence of acid reflux off PPI.  Symptoms appear to be well-controlled on twice daily PPI and famotidine 20 mg twice daily.  Still with some occasional dysphagia, usually worse with bread.  States she did not have as much improvement with dilation this time as previously.  Advised her to chew adequately, take small bites and alternating with sips of liquids.  Nausea, early satiety: Recently had some improvement in nausea after increasing her hydration with drinking body armor drinks.  Over the last few weeks she has had a little bit of increase in nausea.  Appetite remains at baseline.  Thinks of her nausea may be contributed by recurrent UTI.  Continues to have a constant fullness sensation and early satiety.  Previous GES normal.  Did not have improvement with erythromycin in the past.  Unclear if Reglan helpful.  Most recently has been using Dramamine rather than Zofran or Phenergan.  Advised to hold Dramamine for now we will trial Reglan 5 mg twice daily and she may take Zofran as needed for breakthrough.  Last EKG with normal QTc while on frequent Zofran.  Diarrhea: Having some increased urgency and increased gas recently.  Noticed a hard time with eating meat, however this is chronic.  Having 3-4  loose stools daily which may be worsened with eating overall vegetables.  Sometimes when she has urgency she has a large bowel movement, sometimes does not want.  Has improvement in abdominal pain/cramping after defecation.  Still using Levsin as needed, maybe once a week for the last month but prior to this had not used it in a while.  Diarrhea seems to be a little bit more postprandial.  Discussed adding in probiotic to her regimen and potentially discussing further evaluation gallbladder etiology but checking  right upper quadrant ultrasound.  Discussed that we may need to do further workup with TSH, fecal elastase in the future to rule out pancreatic etiology.  Could give a trial of cholestyramine however for now we will monitor for improvement of nausea and early satiety prior to seeing diarrhea.  Advise she may use Gas-X, Beano, or Phazyme as needed for increased gas.  Hypertension: The patient was found to have elevated blood pressure when vital signs were checked in the office. The blood pressure was rechecked by the nursing staff and it was found be persistently elevated >140/90 mmHg. I personally advised to the patient to follow up closely with PCP for hypertension control.  PLAN   Continue zofran as needed Trial Reglan 5 mg BID and zofran for breakthrough Trial colon [facility] or Align probiotic.  Gas ex, beano, phazyme if needed.  Levsin only for severe abdominal cramping.  Follow up with PCP for BP management.  Dysphagia precautions discussed. Follow up in 3 months    Venetia Night, MSN, FNP-BC, AGACNP-BC [facility]

## 2022-04-20 ENCOUNTER — Ambulatory Visit (INDEPENDENT_AMBULATORY_CARE_PROVIDER_SITE_OTHER): Payer: Medicare Other | Admitting: Gastroenterology

## 2022-04-20 ENCOUNTER — Encounter: Payer: Self-pay | Admitting: Gastroenterology

## 2022-04-20 VITALS — BP 189/104 | HR 64 | Temp 96.9°F | Ht 62.0 in | Wt 191.4 lb

## 2022-04-20 DIAGNOSIS — K219 Gastro-esophageal reflux disease without esophagitis: Secondary | ICD-10-CM

## 2022-04-20 DIAGNOSIS — R110 Nausea: Secondary | ICD-10-CM

## 2022-04-20 DIAGNOSIS — R1319 Other dysphagia: Secondary | ICD-10-CM | POA: Diagnosis not present

## 2022-04-20 DIAGNOSIS — R197 Diarrhea, unspecified: Secondary | ICD-10-CM | POA: Diagnosis not present

## 2022-04-20 DIAGNOSIS — R6881 Early satiety: Secondary | ICD-10-CM

## 2022-04-20 MED ORDER — METOCLOPRAMIDE HCL 5 MG PO TABS: 5.0000 mg | ORAL_TABLET | Freq: Two times a day (BID) | ORAL | 1 refills | Status: DC | PRN

## 2022-04-20 NOTE — Patient Instructions (Addendum)
We will trial Reglan 5 mg twice daily and he may continue to use Zofran as needed for breakthrough nausea.  [place] for severe abdominal cramping only if absolutely needed.  With Reglan please monitor for any dizziness, dry mouth, involuntary movements such as twitching of your hands, eyes, or face or any difficulties with speech.  For your frequent flatulence and gas discomfort you may use Gas-X, Beano, or Phazyme if needed.  For your looser stools I recommend you take a probiotic such as [facility] Neg[employer] colon health or align.  If symptoms worsen or you decide you would like to complete further workup please call the office and let me know as we will test for pancreatic and gallbladder etiology to your looser stools.  Continue pantoprazole 40 mg twice daily and famotidine 20 mg twice daily. Follow a GERD diet:  Avoid fried, fatty, greasy, spicy, citrus foods. Avoid caffeine and carbonated beverages. Avoid chocolate. Try eating 4-6 small meals a day rather than 3 large meals. Do not eat within 3 hours of laying down. Prop head of bed up on wood or bricks to create a 6 inch incline.   Please follow up with your primary care provider regarding your consistently elevated blood pressures and monitor frequently at home.  If you begin to have any chest pain, shortness of breath, dizziness, lightheadedness, or frequent headaches with an elevated blood pressure you should contact your primary care or go to the ED for further treatment.  It was a pleasure to see you today. I want to create trusting relationships with patients. If you receive a survey regarding your visit,  I greatly appreciate you taking time to fill this out on paper or through your MyChart. I value your feedback.  Venetia Night, MSN, FNP-BC, AGACNP-BC [facility]

## 2022-05-18 DIAGNOSIS — B9620 Unspecified Escherichia coli [E. coli] as the cause of diseases classified elsewhere: Secondary | ICD-10-CM | POA: Diagnosis not present

## 2022-05-18 DIAGNOSIS — N390 Urinary tract infection, site not specified: Secondary | ICD-10-CM | POA: Diagnosis not present

## 2022-05-25 DIAGNOSIS — Z6834 Body mass index (BMI) 34.0-34.9, adult: Secondary | ICD-10-CM | POA: Diagnosis not present

## 2022-05-25 DIAGNOSIS — N6002 Solitary cyst of left breast: Secondary | ICD-10-CM | POA: Diagnosis not present

## 2022-05-25 DIAGNOSIS — R030 Elevated blood-pressure reading, without diagnosis of hypertension: Secondary | ICD-10-CM | POA: Diagnosis not present

## 2022-05-25 DIAGNOSIS — M545 Low back pain, unspecified: Secondary | ICD-10-CM | POA: Diagnosis not present

## 2022-05-25 DIAGNOSIS — B372 Candidiasis of skin and nail: Secondary | ICD-10-CM | POA: Diagnosis not present

## 2022-05-30 DIAGNOSIS — M5136 Other intervertebral disc degeneration, lumbar region: Secondary | ICD-10-CM | POA: Diagnosis not present

## 2022-05-30 DIAGNOSIS — Z79899 Other long term (current) drug therapy: Secondary | ICD-10-CM | POA: Diagnosis not present

## 2022-05-30 DIAGNOSIS — M961 Postlaminectomy syndrome, not elsewhere classified: Secondary | ICD-10-CM | POA: Diagnosis not present

## 2022-05-30 DIAGNOSIS — M1711 Unilateral primary osteoarthritis, right knee: Secondary | ICD-10-CM | POA: Diagnosis not present

## 2022-05-30 DIAGNOSIS — Z794 Long term (current) use of insulin: Secondary | ICD-10-CM | POA: Diagnosis not present

## 2022-05-30 DIAGNOSIS — Z791 Long term (current) use of non-steroidal anti-inflammatories (NSAID): Secondary | ICD-10-CM | POA: Diagnosis not present

## 2022-05-30 DIAGNOSIS — M1712 Unilateral primary osteoarthritis, left knee: Secondary | ICD-10-CM | POA: Diagnosis not present

## 2022-06-20 DIAGNOSIS — E1165 Type 2 diabetes mellitus with hyperglycemia: Secondary | ICD-10-CM | POA: Diagnosis not present

## 2022-06-20 DIAGNOSIS — E7849 Other hyperlipidemia: Secondary | ICD-10-CM | POA: Diagnosis not present

## 2022-06-20 DIAGNOSIS — K2100 Gastro-esophageal reflux disease with esophagitis, without bleeding: Secondary | ICD-10-CM | POA: Diagnosis not present

## 2022-06-20 DIAGNOSIS — R748 Abnormal levels of other serum enzymes: Secondary | ICD-10-CM | POA: Diagnosis not present

## 2022-06-20 DIAGNOSIS — E1143 Type 2 diabetes mellitus with diabetic autonomic (poly)neuropathy: Secondary | ICD-10-CM | POA: Diagnosis not present

## 2022-06-20 DIAGNOSIS — E7801 Familial hypercholesterolemia: Secondary | ICD-10-CM | POA: Diagnosis not present

## 2022-06-26 DIAGNOSIS — Z6834 Body mass index (BMI) 34.0-34.9, adult: Secondary | ICD-10-CM | POA: Diagnosis not present

## 2022-06-26 DIAGNOSIS — E7849 Other hyperlipidemia: Secondary | ICD-10-CM | POA: Diagnosis not present

## 2022-06-26 DIAGNOSIS — I10 Essential (primary) hypertension: Secondary | ICD-10-CM | POA: Diagnosis not present

## 2022-06-26 DIAGNOSIS — M545 Low back pain, unspecified: Secondary | ICD-10-CM | POA: Diagnosis not present

## 2022-06-26 DIAGNOSIS — G720 Drug-induced myopathy: Secondary | ICD-10-CM | POA: Diagnosis not present

## 2022-06-26 DIAGNOSIS — E1165 Type 2 diabetes mellitus with hyperglycemia: Secondary | ICD-10-CM | POA: Diagnosis not present

## 2022-07-03 ENCOUNTER — Encounter: Payer: Self-pay | Admitting: Gastroenterology

## 2022-07-11 DIAGNOSIS — M961 Postlaminectomy syndrome, not elsewhere classified: Secondary | ICD-10-CM | POA: Diagnosis not present

## 2022-07-11 DIAGNOSIS — Z1330 Encounter for screening examination for mental health and behavioral disorders, unspecified: Secondary | ICD-10-CM | POA: Diagnosis not present

## 2022-07-11 DIAGNOSIS — M47816 Spondylosis without myelopathy or radiculopathy, lumbar region: Secondary | ICD-10-CM | POA: Diagnosis not present

## 2022-07-18 DIAGNOSIS — Z6833 Body mass index (BMI) 33.0-33.9, adult: Secondary | ICD-10-CM | POA: Diagnosis not present

## 2022-07-18 DIAGNOSIS — R030 Elevated blood-pressure reading, without diagnosis of hypertension: Secondary | ICD-10-CM | POA: Diagnosis not present

## 2022-07-18 DIAGNOSIS — R300 Dysuria: Secondary | ICD-10-CM | POA: Diagnosis not present

## 2022-07-26 DIAGNOSIS — Z1231 Encounter for screening mammogram for malignant neoplasm of breast: Secondary | ICD-10-CM | POA: Diagnosis not present

## 2022-08-10 DIAGNOSIS — R300 Dysuria: Secondary | ICD-10-CM | POA: Diagnosis not present

## 2022-08-17 ENCOUNTER — Encounter: Payer: Self-pay | Admitting: Gastroenterology

## 2022-08-17 ENCOUNTER — Ambulatory Visit (INDEPENDENT_AMBULATORY_CARE_PROVIDER_SITE_OTHER): Payer: Medicare Other | Admitting: Gastroenterology

## 2022-08-17 VITALS — BP 167/91 | HR 67 | Temp 98.2°F | Ht 62.0 in | Wt 189.4 lb

## 2022-08-17 DIAGNOSIS — K580 Irritable bowel syndrome with diarrhea: Secondary | ICD-10-CM

## 2022-08-17 DIAGNOSIS — R6881 Early satiety: Secondary | ICD-10-CM

## 2022-08-17 DIAGNOSIS — R110 Nausea: Secondary | ICD-10-CM | POA: Diagnosis not present

## 2022-08-17 DIAGNOSIS — K219 Gastro-esophageal reflux disease without esophagitis: Secondary | ICD-10-CM | POA: Diagnosis not present

## 2022-08-17 DIAGNOSIS — R1319 Other dysphagia: Secondary | ICD-10-CM

## 2022-08-17 NOTE — Progress Notes (Signed)
GI Office Note    Referring Provider: Lianne Moris, PA-C Primary Care Physician:  Lianne Moris, PA-C Primary Gastroenterologist: Hennie Duos. Marletta Lor, DO  Date:  08/17/2022  ID:  Kelly Wiley, DOB 1955/05/08, MRN [id number]   Chief Complaint   Chief Complaint  Patient presents with   Follow-up   History of Present Illness  Kelly Wiley is a 67 y.o. female with a history of diabetes, fibromyalgia, GERD, HTN, HLD, seizures, dysphagia, IBS, and chronic nausea presenting today for follow up.   GES performed at [facility] in 2015 with normal emptying at 2 hours and 4 hours.    EGD December 2012 with dilation and gastritis negative for H. Pylori.    EGD 09/19/21: - small hiatal hernia - mild schatzki ring s/p dilation - gastritis s/p biopsy - normal duodenum - biopsies negative for H. Pylori   Colonoscopy 09/19/21:  - 3 mm ascending polyp - 5 mm descending polyp - Pathology: descending polyp tubular adenoma and ascending polyp with lymphoid aggregate. Benign random biopsies - Repeat colonoscopy in 5 years   Recent ED visit at [facility] 01/16/22 for concern of UTI. Reported sepsis at hospital in [place] 2 weeks prior. Endorsed flank pain and chronic abdominal pain. Labs 01/16/22: Hgb 15.8, MCV 87, Plt 233,  K 3.3, sodium 136, Cr 0.8, normal LFTs, Lipase wnl. CT A/P with hepatic density consistent with steatosis, elongated left hepatic lobe into LUQ, cholelithiasis without cholecystitis, no biliary dilation, normal pancreas and spleen, renal scarring and non-obstructing left renal stones. Advised to see general surgery or PCP regarding [place].   Seen Dr. Aurea Graff 01/19/2022 regarding nausea and bloating.  She suspected her symptoms were not classic for biliary colic and that may be complicated by her UTI.  She ordered a dedicated right upper quadrant ultrasound and advised to follow-up thereafter.   Last office visit 01/24/22. Patient suspected nausea related to reclast injection. Reported ed visit  in [place] with fever and diagnosed with ecoli bacteremia and ecoli uti. Recently doubled ppi and added famotidine. Worsening of her chronic nausea. Constant abdominal fullness sensation. Rare vomiting. Lack of energy. Reported 10 lb weight loss in 1 month. Reported symptoms came in cycles. Nausea worse in the mornings. BM once to twice per day. Due to have RUQ [place] in near future. Continue ppi bid and famotidine twice daily for breakthrough. Advised smaller more frequent meals. Advised to continue zofran up to TID as needed. May consider trial of reglan.   No record of RUQ [place] per review of care everywhere.  Last office visit 04/20/2022.  Denies any indigestion.  Did report some mild dysphagia that is worse with food and bread.  History of nausea and early satiety.  Reports she is having trouble staying hydrated and drinking plenty on Rehman which helped improve that.  Not losing weight.  Appetite is so-so.  Failed erythromycin and Reglan in the past.  Dramamine working well for nausea.  States it seems to work better than Zofran and Phenergan.  Having a lot of gas and urgency.  Hard time eating meat.  Abdominal pain improves after defecation.  Taking Levsin only as needed.  Advise Zofran as needed and retrial of Reglan 5 mg twice daily.  Advised to try [employer]' colon health or align probiotic as well as Gas-X, Beano, or Phazyme as needed.  Levsin as needed.  Advised dysphagia precautions.  Today: Reglan caused her to have some body pain and once she stopped the Reglan she got better. Until yesterday  her nausea was good. Just not able to eat a lot at one time and has started to eat more throughout the day. Today and yesterday is more nausea than normal. She is still taking the dramamine daiyl to help with the nausea she gets from her pain medication. Some days when her nausea is worse she has a harder time with her memory and some brain fog.   Dysphagia is at baseline, watching the size of her bites. No issues  wit liquids.   Even when not nauseas she can not think about eating it at home herself.   Had about a 2 week flare of IBS with diarrhea. Will have pain and then eventually be able to to go and then go a lot. Not needing to go frequently. While not having an IBS flare her normal stools are 2-4 daily and is usually soft/normal. More recently has been struggling to have BMs and just not being able to go. She will take a stool softener at times and that does seem to help.   Has been having some issues with UTIs and queried whether or not that could contribute.   Heartburn has been well controlled. Has a constant fullness sensation. Not taking anything currently to help with gassiness. Just started taking a probiotic for 2 days.  ([place] women's probiotic).   Last A1c 6.9   Current Outpatient Medications  Medication Sig Dispense Refill   carvedilol (COREG) 12.5 MG tablet TAKE (1) TABLET TWICE DAILY. 60 tablet 9   cloNIDine (CATAPRES) 0.1 MG tablet Take 0.1 mg by mouth 2 (two) times daily.     diclofenac (VOLTAREN) 75 MG EC tablet Take by mouth.     dimenhyDRINATE (MOTION SICKNESS RELIEF) 50 MG tablet Take 50 mg by mouth in the morning and at bedtime.     estradiol (ESTRACE) 0.1 MG/GM vaginal cream Place vaginally.     ezetimibe (ZETIA) 10 MG tablet Take 10 mg by mouth daily.     HUMALOG KWIKPEN 100 UNIT/ML KwikPen 30 Units 2 (two) times daily before a meal.     HYDROcodone-acetaminophen (NORCO) 10-325 MG tablet Take 1 tablet by mouth 2 (two) times daily as needed.     hyoscyamine (LEVSIN) 0.125 MG tablet Take 0.125 mg by mouth every 4 (four) hours as needed for cramping.     LANTUS SOLOSTAR 100 UNIT/ML Solostar Pen SMARTSIG:60 Unit(s) SUB-Q Daily     pantoprazole (PROTONIX) 40 MG tablet Take 40 mg by mouth 2 (two) times daily.     tiZANidine (ZANAFLEX) 4 MG tablet Take 4 mg by mouth at bedtime.     Vitamin D, Ergocalciferol, (DRISDOL) 1.25 MG (50000 UNIT) CAPS capsule Take 50,000 Units  by mouth 2 (two) times a week.     No current facility-administered medications for this visit.    Past Medical History:  Diagnosis Date   Cervical spine disease    DM (diabetes mellitus) (HCC)    Dysphagia    Fibromyalgia    GERD (gastroesophageal reflux disease)    HTN (hypertension)    Hypercholesteremia    PONV (postoperative nausea and vomiting)    Seasonal allergies    Seizures (HCC)    last one > 20 years ago    Past Surgical History:  Procedure Laterality Date   BACK SURGERY     BALLOON DILATION N/A 09/19/2021   Procedure: BALLOON DILATION;  Surgeon: Lanelle Bal, DO;  [facility];  Service: Endoscopy;  Laterality: N/A;   BIOPSY  09/19/2021   Procedure: BIOPSY;  Surgeon: Lanelle Bal, DO;  [facility];  Service: Endoscopy;;  gastric;random colon;   CARPAL TUNNEL RELEASE  RIGHT   CERVICAL SPINE SURGERY  06/19/2010   COLONOSCOPY  [facility]    FLEISCHMAN NL   COLONOSCOPY WITH PROPOFOL N/A 09/19/2021   Procedure: COLONOSCOPY WITH PROPOFOL;  Surgeon: Lanelle Bal, DO;  [facility];  Service: Endoscopy;  Laterality: N/A;  12:00pm   ESOPHAGOGASTRODUODENOSCOPY  01/24/2011   UJW:[id number] 2o TO CERVICAL PLATES IN PT'S NECK AND MILD ESO MOTILITY DISORDER/Mild gastritis   ESOPHAGOGASTRODUODENOSCOPY  03/03/2011   FAO:ZHYQ gastritis    ESOPHAGOGASTRODUODENOSCOPY (EGD) WITH PROPOFOL N/A 09/19/2021   Procedure: ESOPHAGOGASTRODUODENOSCOPY (EGD) WITH PROPOFOL;  Surgeon: Lanelle Bal, DO;  [facility];  Service: Endoscopy;  Laterality: N/A;   FLEXIBLE SIGMOIDOSCOPY  01/24/2011   SLF: Internal Hemorrhoids   SAVORY DILATION  01/24/2011   Procedure: SAVORY DILATION;  Surgeon: Arlyce Harman, MD;  [facility];  Service: Endoscopy;  Laterality: N/A;   SHOULDER SURGERY  RIGHT   TUBAL LIGATION      Family History  Problem Relation Age of Onset   Lung cancer Mother    Heart disease Mother        Mitral valve  disease   Congenital heart disease Son        has a PPM   Diabetes Son    Colon cancer Neg Hx    Colon polyps Neg Hx     Allergies as of 08/17/2022 - Review Complete 04/20/2022  Allergen Reaction Noted   Ativan [lorazepam]  07/05/2015   Iodine 131 Other (See Comments) 07/21/2010   Phenytoin sodium extended Other (See Comments) 07/21/2010   Sulfa antibiotics Other (See Comments) 07/21/2010   Zarontin [ethosuximide]  09/09/2021    Social History   Socioeconomic History   Marital status: Widowed    Spouse name: Not on file   Number of children: Not on file   Years of education: Not on file   Highest education level: Not on file  Occupational History   Not on file  Tobacco Use   Smoking status: Former    Packs/day: 0.50    Years: 2.00    Additional pack years: 0.00    Total pack years: 1.00    Types: Cigarettes    Start date: 10/12/1966    Quit date: 10/11/1968    Years since quitting: 53.8   Smokeless tobacco: Never  Vaping Use   Vaping Use: Never used  Substance and Sexual Activity   Alcohol use: Yes    Alcohol/week: 0.0 standard drinks of alcohol    Comment: occasionally   Drug use: No   Sexual activity: Not on file  Other Topics Concern   Not on file  Social History Narrative   MARRIED, 2 KIDS, YOUNGEST 43 YO.   Son has DM, malabsorption   Works in Audiological scientist in [place]   EtOH: rare   No tobacco products   Social Determinants of Corporate investment banker Strain: Not on file  Food Insecurity: Not on file  Transportation Needs: Not on file  Physical Activity: Not on file  Stress: Not on file  Social Connections: Not on file     Review of Systems   Gen: Denies fever, chills, anorexia. Denies fatigue, weakness, weight loss.  CV: Denies chest pain, palpitations, syncope, peripheral edema, and claudication. Resp: Denies dyspnea at rest, cough, wheezing, coughing up blood, and  pleurisy. GI: See HPI Derm: Denies rash, itching, dry skin Psych: Denies  depression, anxiety, memory loss, confusion. No homicidal or suicidal ideation.  Heme: Denies bruising, bleeding, and enlarged lymph nodes.   Physical Exam   BP (!) 158/104 (BP Location: Right Arm, Patient Position: Sitting, Cuff Size: Normal)   Pulse 68   Temp 98.2 F (36.8 C) (Oral)   Ht 5\' 2"  (1.575 m)   Wt 189 lb 6.4 oz (85.9 kg)   SpO2 98%   BMI 34.64 kg/m   General:   Alert and oriented. No distress noted. Pleasant and cooperative.  Head:  Normocephalic and atraumatic. Eyes:  Conjuctiva clear without scleral icterus. Mouth:  Oral mucosa pink and moist. Good dentition. No lesions. Lungs:  Clear to auscultation bilaterally. No wheezes, rales, or rhonchi. No distress.  Heart:  S1, S2 present without murmurs appreciated.  Abdomen:  +BS, soft, non-tender and non-distended. No rebound or guarding. No HSM or masses noted. Rectal: deferred Msk:  Symmetrical without gross deformities. Normal posture. Extremities:  Without edema. Neurologic:  Alert and  oriented x4 Psych:  Alert and cooperative. Normal mood and affect.   Assessment  CORTNIE DISTLER is a 67 y.o. female with a history of diabetes, fibromyalgia, GERD, HTN, HLD, seizures, dysphagia, IBS, and chronic nausea presenting today for follow up.   GERD, Dysphagia: Dysphagia stable, continues to have some issues with breads and other similar items but overall no overt dysphagia with other solids or liquids.  GERD appears to be well-controlled with pantoprazole 40 mg twice daily.  She does have nausea and vomiting and early satiety as stated below.  Last upper endoscopy in July 2023 with mild gastritis.   Nausea, early satiety: Suspected gastroparesis given her diabetes.  GES normal in 2015 however no recent study on file.  She has failed Reglan and erythromycin in the past.  Most recent repeat trial of Reglan caused body aches and pains.  Does have improvement of symptoms with eating smaller frequent meals throughout the day.  Nausea  controlled with Dramamine and Zofran as needed.   IBS-diarrhea, bloating: Previously having significant urgency.  Continues to have some bloating/gassiness.  Continues to have 2-4 looser/soft stools daily.  When pain occurs it tends to occur in the mornings just prior to her first large bowel movement of the day.  Recently started probiotic 2 days ago.  Advised her to give this a trial for at least 2-4 weeks.  Suspect her symptoms are IBS related given some intermittent constipation which is controlled with stool softener however could consider checking fecal elastase and TSH in the future.  For now we will treat bloating with probiotic and anti-gas medication as needed.  Has Levsin on hand as needed for severe abdominal cramping but uses rarely.   PLAN   Continue pantoprazole 40 mg twice daily Continue dramamine as needed and zofran as needed.  Gastroparesis diet Avoid gas-producing foods Gas-X, Beano, or Phazyme as needed. Continue probiotic Could consider trial of Xifaxin Low FODMAP diet and decreasing to a bland diet when having IBS with diarrhea flare Follow up in 6 months.     Brooke Bonito, MSN, FNP-BC, AGACNP-BC [facility]

## 2022-08-17 NOTE — Patient Instructions (Addendum)
Continue pantoprazole 40 mg twice daily, 30 minutes prior to breakfast and dinner.  May continue to Dramamine daily as needed as well as Zofran for breakthrough.  Continue taking a probiotic.  I want to give this at least 2-4 weeks to see if this is going to help you improve any of your bloating.  Probiotics are good given your frequent need for antibiotics. Follow low FODMAP diet when having IBS flare.   Continue to eat smaller frequent meals throughout the day. Avoid gas-producing foods (eg, cabbage, legumes, onions, broccoli, brussel sprouts, wheat, and potatoes).   Gastroparesis recommendations:  4-6 small meals daily Low fat diet Low fiber diet (avoid raw fruits and vegetables).  Follow-up in 6 months.  It was a pleasure to see you today. I want to create trusting relationships with patients. If you receive a survey regarding your visit,  I greatly appreciate you taking time to fill this out on paper or through your MyChart. I value your feedback.  Brooke Bonito, MSN, FNP-BC, AGACNP-BC [facility]

## 2022-08-21 DIAGNOSIS — R350 Frequency of micturition: Secondary | ICD-10-CM | POA: Diagnosis not present

## 2022-08-21 DIAGNOSIS — R300 Dysuria: Secondary | ICD-10-CM | POA: Diagnosis not present

## 2022-08-21 DIAGNOSIS — R10819 Abdominal tenderness, unspecified site: Secondary | ICD-10-CM | POA: Diagnosis not present

## 2022-08-21 DIAGNOSIS — N3000 Acute cystitis without hematuria: Secondary | ICD-10-CM | POA: Diagnosis not present

## 2022-08-21 DIAGNOSIS — R030 Elevated blood-pressure reading, without diagnosis of hypertension: Secondary | ICD-10-CM | POA: Diagnosis not present

## 2022-09-12 DIAGNOSIS — R300 Dysuria: Secondary | ICD-10-CM | POA: Diagnosis not present

## 2022-09-12 DIAGNOSIS — Z6833 Body mass index (BMI) 33.0-33.9, adult: Secondary | ICD-10-CM | POA: Diagnosis not present

## 2022-09-12 DIAGNOSIS — R030 Elevated blood-pressure reading, without diagnosis of hypertension: Secondary | ICD-10-CM | POA: Diagnosis not present

## 2022-09-19 DIAGNOSIS — M170 Bilateral primary osteoarthritis of knee: Secondary | ICD-10-CM | POA: Diagnosis not present

## 2022-09-26 DIAGNOSIS — M170 Bilateral primary osteoarthritis of knee: Secondary | ICD-10-CM | POA: Diagnosis not present

## 2022-09-27 DIAGNOSIS — M545 Low back pain, unspecified: Secondary | ICD-10-CM | POA: Diagnosis not present

## 2022-09-27 DIAGNOSIS — I10 Essential (primary) hypertension: Secondary | ICD-10-CM | POA: Diagnosis not present

## 2022-09-27 DIAGNOSIS — Z6832 Body mass index (BMI) 32.0-32.9, adult: Secondary | ICD-10-CM | POA: Diagnosis not present

## 2022-09-27 DIAGNOSIS — G720 Drug-induced myopathy: Secondary | ICD-10-CM | POA: Diagnosis not present

## 2022-09-27 DIAGNOSIS — E7849 Other hyperlipidemia: Secondary | ICD-10-CM | POA: Diagnosis not present

## 2022-09-27 DIAGNOSIS — E1165 Type 2 diabetes mellitus with hyperglycemia: Secondary | ICD-10-CM | POA: Diagnosis not present

## 2022-09-27 DIAGNOSIS — D485 Neoplasm of uncertain behavior of skin: Secondary | ICD-10-CM | POA: Diagnosis not present

## 2022-09-29 DIAGNOSIS — E1143 Type 2 diabetes mellitus with diabetic autonomic (poly)neuropathy: Secondary | ICD-10-CM | POA: Diagnosis not present

## 2022-09-29 DIAGNOSIS — E039 Hypothyroidism, unspecified: Secondary | ICD-10-CM | POA: Diagnosis not present

## 2022-09-29 DIAGNOSIS — E7801 Familial hypercholesterolemia: Secondary | ICD-10-CM | POA: Diagnosis not present

## 2022-09-29 DIAGNOSIS — E7800 Pure hypercholesterolemia, unspecified: Secondary | ICD-10-CM | POA: Diagnosis not present

## 2022-09-29 DIAGNOSIS — E7849 Other hyperlipidemia: Secondary | ICD-10-CM | POA: Diagnosis not present

## 2022-09-29 DIAGNOSIS — R300 Dysuria: Secondary | ICD-10-CM | POA: Diagnosis not present

## 2022-09-29 DIAGNOSIS — K219 Gastro-esophageal reflux disease without esophagitis: Secondary | ICD-10-CM | POA: Diagnosis not present

## 2022-09-29 DIAGNOSIS — E876 Hypokalemia: Secondary | ICD-10-CM | POA: Diagnosis not present

## 2022-09-29 DIAGNOSIS — E782 Mixed hyperlipidemia: Secondary | ICD-10-CM | POA: Diagnosis not present

## 2022-10-03 DIAGNOSIS — M170 Bilateral primary osteoarthritis of knee: Secondary | ICD-10-CM | POA: Diagnosis not present

## 2022-10-16 DIAGNOSIS — N952 Postmenopausal atrophic vaginitis: Secondary | ICD-10-CM | POA: Insufficient documentation

## 2022-10-16 DIAGNOSIS — N200 Calculus of kidney: Secondary | ICD-10-CM | POA: Insufficient documentation

## 2022-10-16 DIAGNOSIS — M5020 Other cervical disc displacement, unspecified cervical region: Secondary | ICD-10-CM | POA: Insufficient documentation

## 2022-10-16 DIAGNOSIS — N3946 Mixed incontinence: Secondary | ICD-10-CM | POA: Insufficient documentation

## 2022-10-16 DIAGNOSIS — N390 Urinary tract infection, site not specified: Secondary | ICD-10-CM | POA: Insufficient documentation

## 2022-10-16 NOTE — Progress Notes (Unsigned)
Name: Kelly Wiley DOB: [birth year] MRN: [id number]  History of Present Illness: Kelly Wiley is a 67 y.o. female who presents today as a new patient at [facility]. All available relevant medical records have been reviewed.  - GU History: 1. Recurrent UTls. Previously followed by Dr. Ashley Royalty at [facility]. Per chart review:  - Patient reported history of pyelonephritis in childhood and an episode of urosepsis in [place] in 2023. - Risk factors for UTI: T2DM, vaginal atrophy, occasional fecal incontinence when her IBS flares up with watery stool.  - Cystoscopy unremarkable in March 2018 and on 05/18/2022.  - Previously treated with daily low dose Nitrofurantoin for UTI prophylaxis. Previously educated about OTC supplement recommendations for UTI prevention. 2. Vaginal atrophy. - Using topical vaginal estrogen cream 2x/week. 3. Kidney stones.  - RUS on 02/02/2022 showed a non-obstructing 5 mm left kidney stone. 4. Mixed stress and urge urinary incontinence. Not significantly bothersome.  Urine culture results in past 12 months: - 02/02/2022: Positive for >100k E. coli - 03/14/2022: Positive for Klebsiella aerogenes - 03/28/2022: Positive for E. coli - 04/19/2022: Positive for E. coli - 05/18/2022: Positive for E. coli  - 07/18/2022: Positive for E. coli - 08/10/2022: Positive for E. coli - 08/21/2022: Positive for E. coli  Today: She reports that when she's on antibiotics her symptoms are well controlled but then recur within a couple days after completing the antibiotics. She reports that her typical UTI symptoms include low back pain, bladder pain, increased urinary urgency, frequency. Does not typically experience dysuria with UTI.  At baseline she denies bladder pain, urgency, dysuria, gross hematuria, hesitancy, straining to void, or sensations of incomplete emptying. She reports baseline urinary frequency - voids 6-8x/day. She leaks small  volumes of urine several times per day, which she states is not significantly bothersome; wears pads. She reports the SUI is predominant. Reports occasional nocturia, which she states is manageable.  Reports some "pressure" in flank areas. Denies abdominal pain. Denies fever.   Fall Screening: Do you usually have a device to assist in your mobility? No   Medications: Current Outpatient Medications  Medication Sig Dispense Refill   carvedilol (COREG) 12.5 MG tablet TAKE (1) TABLET TWICE DAILY. 60 tablet 9   cephALEXin (KEFLEX) 250 MG capsule Take 1 capsule (250 mg total) by mouth daily. 30 capsule 11   cloNIDine (CATAPRES) 0.1 MG tablet Take 0.1 mg by mouth 2 (two) times daily.     diclofenac (VOLTAREN) 75 MG EC tablet Take by mouth.     dimenhyDRINATE (MOTION SICKNESS RELIEF) 50 MG tablet Take 50 mg by mouth in the morning and at bedtime.     estradiol (ESTRACE) 0.1 MG/GM vaginal cream Place vaginally.     ezetimibe (ZETIA) 10 MG tablet Take 10 mg by mouth daily.     HUMALOG KWIKPEN 100 UNIT/ML KwikPen 30 Units 2 (two) times daily before a meal.     HYDROcodone-acetaminophen (NORCO) 10-325 MG tablet Take 1 tablet by mouth 2 (two) times daily as needed.     hyoscyamine (LEVSIN) 0.125 MG tablet Take 0.125 mg by mouth every 4 (four) hours as needed for cramping.     LANTUS SOLOSTAR 100 UNIT/ML Solostar Pen SMARTSIG:60 Unit(s) SUB-Q Daily     NONFORMULARY OR COMPOUNDED ITEM Ellura supplement: 36 mg of soluble Proanthocyanidin (PAC). Take 1 capsule by mouth once daily in the morning to prevent recurrent UTI.  Can be purchased via online vendors or obtained at  a discount by sending this prescription to: [facility] Fax #: [phone] Address: [address], [place], [place] [address] NCPD# [id number] 30 each 11   pantoprazole (PROTONIX) 40 MG tablet Take 40 mg by mouth 2 (two) times daily.     tiZANidine (ZANAFLEX) 4 MG tablet Take 4 mg by mouth at bedtime.     Vitamin D,  Ergocalciferol, (DRISDOL) 1.25 MG (50000 UNIT) CAPS capsule Take 50,000 Units by mouth 2 (two) times a week.     No current facility-administered medications for this visit.    Allergies: Allergies  Allergen Reactions   Ativan [Lorazepam]     Lowered blood pressure   Iodine 131 Other (See Comments)    Blacked out  Dye*    Phenytoin Sodium Extended Other (See Comments)    Killed white blood cell at age of 41  Dilantin    Sulfa Antibiotics Other (See Comments)    Bones    Zarontin [Ethosuximide]     Pt does not remember reaction     Past Medical History:  Diagnosis Date   Cervical spine disease    DM (diabetes mellitus) (HCC)    Dysphagia    Fibromyalgia    GERD (gastroesophageal reflux disease)    HTN (hypertension)    Hypercholesteremia    PONV (postoperative nausea and vomiting)    Seasonal allergies    Seizures (HCC)    last one > 20 years ago   Past Surgical History:  Procedure Laterality Date   BACK SURGERY     BALLOON DILATION N/A 09/19/2021   Procedure: BALLOON DILATION;  Surgeon: Lanelle Bal, DO;  [facility];  Service: Endoscopy;  Laterality: N/A;   BIOPSY  09/19/2021   Procedure: BIOPSY;  Surgeon: Lanelle Bal, DO;  [facility];  Service: Endoscopy;;  gastric;random colon;   CARPAL TUNNEL RELEASE  RIGHT   CERVICAL SPINE SURGERY  06/19/2010   COLONOSCOPY  [facility]    FLEISCHMAN NL   COLONOSCOPY WITH PROPOFOL N/A 09/19/2021   Procedure: COLONOSCOPY WITH PROPOFOL;  Surgeon: Lanelle Bal, DO;  [facility];  Service: Endoscopy;  Laterality: N/A;  12:00pm   ESOPHAGOGASTRODUODENOSCOPY  01/24/2011   HQI:ONGEXBMWU 2o TO CERVICAL PLATES IN PT'S NECK AND MILD ESO MOTILITY DISORDER/Mild gastritis   ESOPHAGOGASTRODUODENOSCOPY  03/03/2011   XLK:GMWN gastritis    ESOPHAGOGASTRODUODENOSCOPY (EGD) WITH PROPOFOL N/A 09/19/2021   Procedure: ESOPHAGOGASTRODUODENOSCOPY (EGD) WITH PROPOFOL;  Surgeon: Lanelle Bal, DO;   [facility];  Service: Endoscopy;  Laterality: N/A;   FLEXIBLE SIGMOIDOSCOPY  01/24/2011   SLF: Internal Hemorrhoids   SAVORY DILATION  01/24/2011   Procedure: SAVORY DILATION;  Surgeon: Arlyce Harman, MD;  [facility];  Service: Endoscopy;  Laterality: N/A;   SHOULDER SURGERY  RIGHT   TUBAL LIGATION     Family History  Problem Relation Age of Onset   Lung cancer Mother    Heart disease Mother        Mitral valve disease   Congenital heart disease Son        has a PPM   Diabetes Son    Colon cancer Neg Hx    Colon polyps Neg Hx    Social History   Socioeconomic History   Marital status: Widowed    Spouse name: Not on file   Number of children: Not on file   Years of education: Not on file   Highest education  level: Not on file  Occupational History   Not on file  Tobacco Use   Smoking status: Former    Current packs/day: 0.00    Average packs/day: 0.5 packs/day for 2.0 years (1.0 ttl pk-yrs)    Types: Cigarettes    Start date: 10/12/1966    Quit date: 10/11/1968    Years since quitting: 54.0   Smokeless tobacco: Never  Vaping Use   Vaping status: Never Used  Substance and Sexual Activity   Alcohol use: Yes    Alcohol/week: 0.0 standard drinks of alcohol    Comment: occasionally   Drug use: No   Sexual activity: Not Currently  Other Topics Concern   Not on file  Social History Narrative   MARRIED, 2 KIDS, YOUNGEST 31 YO.   Son has DM, malabsorption   Works in Audiological scientist in [place]   EtOH: rare   No tobacco products   Social Determinants of Corporate investment banker Strain: Not on file  Food Insecurity: No Food Insecurity (11/11/2021)   Received from [facility], [facility]   Hunger Vital Sign    Worried About Running Out of Food in the Last Year: Never true    Ran Out of Food in the Last Year: Never true  Transportation Needs: No Transportation Needs (01/19/2022)   Received from [facility], [facility]   PRAPARE -  Transportation    Lack of Transportation (Medical): No    Lack of Transportation (Non-Medical): No  Physical Activity: Not on file  Stress: No Stress Concern Present (11/02/2020)   Received from [facility], [facility] - Occupational Stress Questionnaire    Feeling of Stress : Not at all  Social Connections: Unknown (07/28/2021)   Received from [facility], [facility]   Social Network    Social Network: Not on file  Intimate Partner Violence: Unknown (06/23/2021)   Received from [facility], [facility]   HITS    Physically Hurt: Not on file    Insult or Talk Down To: Not on file    Threaten Physical Harm: Not on file    Scream or Curse: Not on file    SUBJECTIVE  Review of Systems Constitutional: Patient reports general malaise lntegumentary: Patient denies any rashes or pruritus Cardiovascular: Patient denies chest pain or syncope Respiratory: Patient denies shortness of breath Gastrointestinal: Patient denies nausea, vomiting, constipation. Reports chronic diarrhea and recently some green/black colored stools. States she will be following up with GI about that soon. Musculoskeletal: Patient denies muscle cramps Neurologic: Patient denies convulsions or seizures Psychiatric: Patient denies memory problems Allergic/Immunologic: Patient denies recent allergic reaction(s) Hematologic/Lymphatic: Patient denies bleeding tendencies Endocrine: Patient denies heat/cold intolerance  GU: As per HPI.  OBJECTIVE Vitals:   10/17/22 0959  BP: (!) 144/84  Pulse: 76  Temp: 98.4 F (36.9 C)   There is no height or weight on file to calculate BMI.  Physical Examination  Constitutional: No obvious distress; patient is non-toxic appearing  Cardiovascular: No visible lower extremity edema.  Respiratory: The patient does not have audible wheezing/stridor; respirations do not appear labored  Gastrointestinal: Abdomen  non-distended Musculoskeletal: Normal ROM of UEs  Skin: No obvious rashes/open sores  Neurologic: CN 2-12 grossly intact Psychiatric: Answered questions appropriately with normal affect  Hematologic/Lymphatic/Immunologic: No obvious bruises or sites of spontaneous bleeding  UA & PVR: patient felt unable to void in office today; took cup for at-home urine specimen collection   ASSESSMENT  Recurrent UTI - Plan: DG Abd 1 View, US RENAL, cephALEXin (KEFLEX) 250 MG capsule, NONFORMULARY OR COMPOUNDED ITEM, CANCELED: Urinalysis, Routine w reflex microscopic, CANCELED: BLADDER SCAN AMB NON-IMAGING  Kidney stones - Plan: DG Abd 1 View, US RENAL, CANCELED: Urinalysis, Routine w reflex microscopic, CANCELED: BLADDER SCAN AMB NON-IMAGING  Atrophic vaginitis - Plan: CANCELED: Urinalysis, Routine w reflex microscopic, CANCELED: BLADDER SCAN AMB NON-IMAGING  Mixed stress and urge urinary incontinence - Plan: CANCELED: Urinalysis, Routine w reflex microscopic, CANCELED: BLADDER SCAN AMB NON-IMAGING  Irritable bowel syndrome with diarrhea  We discussed the possible etiologies of recurrent UTls including ascending infection related to intercourse; vaginal atrophy; transmural infection that has been treated incompletely; urinary tract stones; incomplete bladder emptying with urinary stasis; kidney or bladder tumor; urethral diverticulum; and colonization of  vagina and urinary tract with pathologic, adherent organisms.   For UTI prevention we discussed options including: Adequate fluid intake (>1.5 liters/day) to flush out the urinary tract. - Go to the bathroom to urinate every 4-6 hours while awake to minimize urinary stasis / bacterial overgrowth in the bladder. - Proanthocyanidin (PAC) supplement 36 mg daily; must be soluble (insoluble form of PAC will be ineffective). Recommend Ellura. Vitamin C supplement Probiotic to maintain healthy vaginal microbiome - Topical vaginal estrogen for vaginal atrophy.  The etiology and consequences of urogenital epithelial atrophy was explained to patient. The thinning of the epithelium of the urethra can contribute to urinary urgency and frequency syndromes. In addition, the normal bacterial flora that colonizes the perineum may contribute to UTI risk because the thin urethral epithelium allows the bacteria to become adherent and the change in vaginal pH can disrupt the vaginal / urethral microbiome and allow for bacterial overgrowth. Will continue using topical vaginal estrogen cream at least 2x/week. - UTI prophylaxis with a daily low dose antibiotic. We discussed the potential risks of prolonged antibiotic treatment particularly with the risks of developing antibiotic resistance. For daily low dose Nitrofurantoin use we discussed risks including pulmonary and liver fibrosis, therefore we agreed to use Keflex 250 mg daily instead.  Advised pt not to use D-mannose for UTI prevention due to sugar content given patient's history of diabetes.  For management of acute UTI symptoms: - Patient was advised that if/when UTI-like symptoms occur they can call our office to speak with a nurse, who can place an order for urinalysis (with reflex to urine culture). They may then proceed to a [facility] to provide their urine sample. This is required for evaluation in order for their Urology provider to make informed treatment recommendation(s), which may or may not include an antibiotic prescription.  She has had prior normal cystoscopy evaluations x2 by Dr. Ashley Royalty.   We discussed the possibility that her known kidney stone may be an infectious nidus. Agreed to proceed with RUS & KUB for recheck and discussed option to proceed with ESWL to eliminate stone burden in hopes that will minimize ongoing UTI recurrence. We discussed that procedure in detail including possible risks and benefits such as pain, infection, sepsis, UTI, ureter perforation, need for stenting,  post-op ureteral stricture, hematuria, bruising. She elected to proceed with ESWL if Dr. Ronne Binning is in agreement following review of imaging results.   All questions were answered.   PLAN Advised the following: 1. RUS & KUB.  2. Start Keflex 250 mg daily. 3. Ellura supplement for UTI prevention. 4. Continue topical vaginal estrogen cream at least 2 nights per week. 5. Continue maintaining daily adequate fluid intake.  6. Likely left ESWL; to be determined by Dr. Ronne Binning.  7. Follow up with GI re: abnormal stool color and consistency.    Orders Placed This Encounter  Procedures   DG Abd 1 View    Standing Status:   Future    Standing Expiration Date:   10/17/2023    Order Specific Question:   Reason for Exam (SYMPTOM  OR DIAGNOSIS REQUIRED)    Answer:   kidney stone    Order Specific Question:   Preferred imaging location?    Answer:   [facility]   US RENAL    Standing Status:   Future    Standing Expiration Date:   10/17/2023    Order Specific Question:   Reason for Exam (SYMPTOM  OR DIAGNOSIS REQUIRED)    Answer:   kidney stone known or suspected    Order Specific Question:   Preferred imaging location?    Answer:   [facility]   It has been explained that the patient is to follow regularly with their PCP in addition to all other providers involved in their care and to follow instructions provided by these respective offices. Patient advised to contact urology clinic if any urologic-pertaining questions, concerns, new symptoms or problems arise in the interim period.  Patient Instructions  Recommendations regarding UTI prevention / management:  When UTI symptoms occur: Call urology office to request order for urine culture. We recommend waiting for urine culture result prior to use of any antibiotics.  For bladder pain/ burning with urination: Over the counter Pyridium (phenazopyridine) as needed (commonly known under the "AZO" brand). No more than 3 days  consecutively at a time due to risk for methemoglobinemia, liver function issues, and bone health damage with long term use of Pyridium.  Routine use for UTI prevention: - Low dose antibiotic daily for UTI prophylaxis. - Topical vaginal estrogen for vaginal atrophy. Adequate fluid intake (>1.5 liters/day) to flush out the urinary tract. - Go to the bathroom to urinate every 4-6 hours while awake to minimize urinary stasis / bacterial overgrowth in the bladder. - Proanthocyanidin (PAC) supplement 36 mg daily; must be soluble (insoluble form of PAC will be ineffective). Recommended brand: Ellura. This is an over-the-counter supplement (often must be found/ purchased online) supplement derived from cranberries with concentrated active component: Proanthocyanidin (PAC) 36 mg daily. Decreases bacterial adherence to bladder lining. Not recommended for patients with interstitial cystitis due to acidity. - Vitamin C supplement to acidify urine to minimize bacterial growth. Not recommended for patients with interstitial cystitis due to acidity. - Probiotic to maintain healthy vaginal microbiome to suppress bacteria at urethral opening. Brand recommendations: Feminine Balance (highest concentration of lactobacillus) or Hyperbiotic Pro 15.  Note for patients with diabetes: You may read about D-mannose powder for UTI prevention. That is an over the-counter supplement which decreases bacterial adherence to bladder lining. I would NOT advise that for you as a person with diabetes due to its sugar content.   Electronically signed by:  Donnita Falls, MSN, FNP-C, CUNP 10/17/2022 11:08 AM

## 2022-10-17 ENCOUNTER — Encounter: Payer: Self-pay | Admitting: Urology

## 2022-10-17 ENCOUNTER — Ambulatory Visit (INDEPENDENT_AMBULATORY_CARE_PROVIDER_SITE_OTHER): Payer: Medicare Other | Admitting: Urology

## 2022-10-17 VITALS — BP 144/84 | HR 76 | Temp 98.4°F

## 2022-10-17 DIAGNOSIS — Z87442 Personal history of urinary calculi: Secondary | ICD-10-CM | POA: Diagnosis not present

## 2022-10-17 DIAGNOSIS — N3946 Mixed incontinence: Secondary | ICD-10-CM | POA: Diagnosis not present

## 2022-10-17 DIAGNOSIS — Z87440 Personal history of urinary (tract) infections: Secondary | ICD-10-CM | POA: Diagnosis not present

## 2022-10-17 DIAGNOSIS — N952 Postmenopausal atrophic vaginitis: Secondary | ICD-10-CM

## 2022-10-17 DIAGNOSIS — K580 Irritable bowel syndrome with diarrhea: Secondary | ICD-10-CM

## 2022-10-17 DIAGNOSIS — N390 Urinary tract infection, site not specified: Secondary | ICD-10-CM

## 2022-10-17 DIAGNOSIS — N200 Calculus of kidney: Secondary | ICD-10-CM

## 2022-10-17 MED ORDER — NONFORMULARY OR COMPOUNDED ITEM: 11 refills | Status: AC

## 2022-10-17 MED ORDER — CEPHALEXIN 250 MG PO CAPS: 250.0000 mg | ORAL_CAPSULE | Freq: Every day | ORAL | 11 refills | Status: DC

## 2022-10-17 NOTE — Patient Instructions (Signed)
Recommendations regarding UTI prevention / management:  When UTI symptoms occur: Call urology office to request order for urine culture. We recommend waiting for urine culture result prior to use of any antibiotics.  For bladder pain/ burning with urination: Over the counter Pyridium (phenazopyridine) as needed (commonly known under the "AZO" brand). No more than 3 days consecutively at a time due to risk for methemoglobinemia, liver function issues, and bone health damage with long term use of Pyridium.  Routine use for UTI prevention: - Low dose antibiotic daily for UTI prophylaxis. - Topical vaginal estrogen for vaginal atrophy. Adequate fluid intake {>1.5 liters/day) to flush out the urinary tract. - Go to the bathroom to urinate every 4-6 hours while awake to minimize urinary stasis / bacterial overgrowth in the bladder. - Proanthocyanidin (PAC) supplement 36 mg daily; must be soluble (insoluble form of PAC will be ineffective). Recommended brand: Ellura. This is an over-the-counter supplement (often must be found/ purchased online) supplement derived from cranberries with concentrated active component: Proanthocyanidin (PAC) 36 mg daily. Decreases bacterial adherence to bladder lining. Not recommended for patients with interstitial cystitis due to acidity. - Vitamin C supplement to acidify urine to minimize bacterial growth. Not recommended for patients with interstitial cystitis due to acidity. - Probiotic to maintain healthy vaginal microbiome to suppress bacteria at urethral opening. Brand recommendations: Feminine Balance (highest concentration of lactobacillus) or Hyperbiotic Pro 15.  Note for patients with diabetes: You may read about D-mannose powder for UTI prevention. That is an over the-counter supplement which decreases bacterial adherence to bladder lining. I would NOT advise that for you as a person with diabetes due to its sugar content.  

## 2022-10-17 NOTE — Progress Notes (Signed)
GI Office Note    Referring Provider: Lianne Moris, PA-C Primary Care Physician:  Lianne Moris, PA-C Primary Gastroenterologist: Hennie Duos. Marletta Lor, DO  Date:  10/18/2022  ID:  Kelly Wiley, DOB 01/15/1956, MRN [id number]  Chief Complaint   Chief Complaint  Patient presents with   Follow-up    IBS-D diarrhea blackish-green and a lot of painful gas   History of Present Illness  Kelly Wiley is a 67 y.o. female with a history of seizures, HLD, HTN, GERD, fibromyalgia, diabetes, IBS, chronic nausea, and dysphagia presenting today with complaint of bloating/gas and dark green stools.  Last office visit 08/17/22.  Reported Reglan caused her to have body pain and felt better after she stopped.  Nausea was good up until day before her office visit.  Realize she is not able to eat larger meals.  Taking Dramamine daily to help with nausea that occurs from her pain medication.  States her nausea makes her memory and brain fog worse.  Continues to watch the size of her bites of food given her dysphagia.  Reported 2 weeks flare of IBS with diarrhea.  Will have pain and eventually go to the bathroom and goes a lot.  Denied frequent bowel movements.  Her normal without IBS flare is 2-4 stools daily.  Noted to have more frequent UTIs recently.  Reflux well-controlled but with constant fullness sensation and gassiness.  Had recently started [place] women's probiotic but had only been taking for 2 days.  Advised to continue PPI twice daily, Dramamine as needed and Zofran as needed.  Continue smaller more frequent meals and to use Gas-X, Beano, or Phazyme as needed.  Continue daily probiotic advised that we could consider Xifaxan in the future and also consider fecal elastase and TSH if ongoing diarrhea.  Today: Has had more gas recently. Stools had been better until yesterday. In the last couple weeks she is going 5-10 BM daily (usually smaller amounts each time), but usually 3-5 times per day. Ore  oftne than not she just needs to go.  Yesterday her gas was better but for the past 3 days she has seen dark green stools. 2 days ago she had diarrhea. Yesterday she had some more formed type stool. Denies dark/green leafy vegetables. Has not had much of an appetite. Has some abdominal pain but feels like it is possibly more from the gas pain. Has been taking the gas ex but can not tell much of a difference. Has bene trying the Levsin as well with the gas ex and that did not help either.   Has had some grapes and cherries but not an excessive amount. Does drink orange juice but mainly when sugar goes low. Has eaten watermelon and peaches. Has eaten Ramen noodle but does not recall eating anything with food dye in it.  Has been taking a probiotic daily - [place] Women's Probiotic. Does eat yogurt. Uses keefer  in her smoothies.   Has had a constant UTI for 2 months essentially. Seeing urology. Has [place] and everything set up for further investigation.   Dramamine is helping her nausea. Trying to eat healthier and smaller meals but when she feels so full and bloated and makes her not want to eat much but hinders her ability to eat good food.   GERD controlled with BID PPI.   Current Outpatient Medications  Medication Sig Dispense Refill   carvedilol (COREG) 12.5 MG tablet TAKE (1) TABLET TWICE DAILY. 60 tablet 9  cloNIDine (CATAPRES) 0.1 MG tablet Take 0.1 mg by mouth 2 (two) times daily.     diclofenac (VOLTAREN) 75 MG EC tablet Take by mouth.     dimenhyDRINATE (MOTION SICKNESS RELIEF) 50 MG tablet Take 50 mg by mouth in the morning and at bedtime.     estradiol (ESTRACE) 0.1 MG/GM vaginal cream Place vaginally.     ezetimibe (ZETIA) 10 MG tablet Take 10 mg by mouth daily.     HUMALOG KWIKPEN 100 UNIT/ML KwikPen 30 Units 2 (two) times daily before a meal.     HYDROcodone-acetaminophen (NORCO) 10-325 MG tablet Take 1 tablet by mouth 2 (two) times daily as needed.     hyoscyamine (LEVSIN)  0.125 MG tablet Take 0.125 mg by mouth every 4 (four) hours as needed for cramping.     LANTUS SOLOSTAR 100 UNIT/ML Solostar Pen SMARTSIG:60 Unit(s) SUB-Q Daily     pantoprazole (PROTONIX) 40 MG tablet Take 40 mg by mouth 2 (two) times daily.     Simethicone (GAS-X PO) Take by mouth.     tiZANidine (ZANAFLEX) 4 MG tablet Take 4 mg by mouth at bedtime.     Vitamin D, Ergocalciferol, (DRISDOL) 1.25 MG (50000 UNIT) CAPS capsule Take 50,000 Units by mouth 2 (two) times a week.     cephALEXin (KEFLEX) 250 MG capsule Take 1 capsule (250 mg total) by mouth daily. (Patient not taking: Reported on 10/18/2022) 30 capsule 11   NONFORMULARY OR COMPOUNDED ITEM Ellura supplement: 36 mg of soluble Proanthocyanidin (PAC). Take 1 capsule by mouth once daily in the morning to prevent recurrent UTI.  Can be purchased via online vendors or obtained at a discount by sending this prescription to: [facility] Fax #: [phone] Address: [address] [address], [place], [place] [address] NCPD# [id number] (Patient not taking: Reported on 10/18/2022) 30 each 11   No current facility-administered medications for this visit.    Past Medical History:  Diagnosis Date   Cervical spine disease    DM (diabetes mellitus) (HCC)    Dysphagia    Fibromyalgia    GERD (gastroesophageal reflux disease)    HTN (hypertension)    Hypercholesteremia    PONV (postoperative nausea and vomiting)    Seasonal allergies    Seizures (HCC)    last one > 20 years ago    Past Surgical History:  Procedure Laterality Date   BACK SURGERY     BALLOON DILATION N/A 09/19/2021   Procedure: BALLOON DILATION;  Surgeon: Lanelle Bal, DO;  [facility];  Service: Endoscopy;  Laterality: N/A;   BIOPSY  09/19/2021   Procedure: BIOPSY;  Surgeon: Lanelle Bal, DO;  [facility];  Service: Endoscopy;;  gastric;random colon;   CARPAL TUNNEL RELEASE  RIGHT   CERVICAL SPINE SURGERY  06/19/2010   COLONOSCOPY  2008  [facility]    FLEISCHMAN NL   COLONOSCOPY WITH PROPOFOL N/A 09/19/2021   Procedure: COLONOSCOPY WITH PROPOFOL;  Surgeon: Lanelle Bal, DO;  [facility];  Service: Endoscopy;  Laterality: N/A;  12:00pm   ESOPHAGOGASTRODUODENOSCOPY  01/24/2011   FAO:ZHYQMVHQI 2o TO CERVICAL PLATES IN PT'S NECK AND MILD ESO MOTILITY DISORDER/Mild gastritis   ESOPHAGOGASTRODUODENOSCOPY  03/03/2011   ONG:EXBM gastritis    ESOPHAGOGASTRODUODENOSCOPY (EGD) WITH PROPOFOL N/A 09/19/2021   Procedure: ESOPHAGOGASTRODUODENOSCOPY (EGD) WITH PROPOFOL;  Surgeon: Lanelle Bal, DO;  [facility];  Service: Endoscopy;  Laterality: N/A;   FLEXIBLE SIGMOIDOSCOPY  01/24/2011  SLF: Internal Hemorrhoids   SAVORY DILATION  01/24/2011   Procedure: SAVORY DILATION;  Surgeon: Arlyce Harman, MD;  [facility];  Service: Endoscopy;  Laterality: N/A;   SHOULDER SURGERY  RIGHT   TUBAL LIGATION      Family History  Problem Relation Age of Onset   Lung cancer Mother    Heart disease Mother        Mitral valve disease   Congenital heart disease Son        has a PPM   Diabetes Son    Colon cancer Neg Hx    Colon polyps Neg Hx     Allergies as of 10/18/2022 - Review Complete 10/18/2022  Allergen Reaction Noted   Ativan [lorazepam]  07/05/2015   Iodine 131 Other (See Comments) 07/21/2010   Phenytoin sodium extended Other (See Comments) 07/21/2010   Sulfa antibiotics Other (See Comments) 07/21/2010   Zarontin [ethosuximide]  09/09/2021    Social History   Socioeconomic History   Marital status: Widowed    Spouse name: Not on file   Number of children: Not on file   Years of education: Not on file   Highest education level: Not on file  Occupational History   Not on file  Tobacco Use   Smoking status: Former    Current packs/day: 0.00    Average packs/day: 0.5 packs/day for 2.0 years (1.0 ttl pk-yrs)    Types: Cigarettes    Start date: 10/12/1966    Quit date: 10/11/1968    Years  since quitting: 54.0   Smokeless tobacco: Never  Vaping Use   Vaping status: Never Used  Substance and Sexual Activity   Alcohol use: Yes    Alcohol/week: 0.0 standard drinks of alcohol    Comment: occasionally   Drug use: No   Sexual activity: Not Currently  Other Topics Concern   Not on file  Social History Narrative   MARRIED, 2 KIDS, YOUNGEST 45 YO.   Son has DM, malabsorption   Works in Audiological scientist in [place]   EtOH: rare   No tobacco products   Social Determinants of Corporate investment banker Strain: Not on file  Food Insecurity: No Food Insecurity (11/11/2021)   Received from [facility], [facility]   Hunger Vital Sign    Worried About Running Out of Food in the Last Year: Never true    Ran Out of Food in the Last Year: Never true  Transportation Needs: No Transportation Needs (01/19/2022)   [facility], [facility]   PRAPARE - Transportation    Lack of Transportation (Medical): No    Lack of Transportation (Non-Medical): No  Physical Activity: Not on file  Stress: No Stress Concern Present (11/02/2020)   Received from [facility], [facility] of Occupational Health - Occupational Stress Questionnaire    Feeling of Stress : Not at all  Social Connections: Unknown (07/28/2021)   Received from [facility], [facility]   Social Network    Social Network: Not on file   Review of Systems   Gen: Denies fever, chills, anorexia. Denies fatigue, weakness, weight loss.  CV: Denies chest pain, palpitations, syncope, peripheral edema, and claudication. Resp: Denies dyspnea at rest, cough, wheezing, coughing up blood, and pleurisy. GI: See HPI Derm: Denies rash, itching, dry skin Psych: Denies depression, anxiety, memory loss, confusion. No homicidal or suicidal ideation.  Heme: Denies bruising, bleeding, and enlarged lymph nodes.  Physical Exam  BP 119/77   Pulse 73   Temp 98.6 F (37 C)   Ht 5\' 2"  (1.575 m)    Wt 186 lb 3.2 oz (84.5 kg)   BMI 34.06 kg/m   General:   Alert and oriented. No distress noted. Pleasant and cooperative.  Head:  Normocephalic and atraumatic. Eyes:  Conjuctiva clear without scleral icterus. Mouth:  Oral mucosa pink and moist. Good dentition. No lesions. Abdomen:  +BS, soft, non-distended.  TTP to the umbilical region.  No rebound or guarding. No HSM or masses noted. Rectal: deferred Msk:  Symmetrical without gross deformities. Normal posture. Extremities:  Without edema. Neurologic:  Alert and  oriented x4 Psych:  Alert and cooperative. Normal mood and affect.   Assessment  EVEE ABUNDIZ is a 67 y.o. female with a history of diabetes, fibromyalgia, GERD, HTN, HLD, seizures, dysphagia, IBS, and chronic nausea presenting today with ongoing IBS symptoms.   IBS-diarrhea, bloating: Baseline is 3-5 times per day however having a flare the last couple of weeks with 5-10 daily. Continues to experience significant bloating and recently having darker greens tools. Intermittently will have some softer more formed stools. Abdominal pain most likely secondary to gsa, Takes womens probiotic daily and has not seen much improvement with gas ex. No significant improvement with Levsin. Admits to intermittent consumption of keefer which may be contributing to bloating. Worsening diarrhea could be exacerbated by use of antibiotics for chronic UTI. Advised using famotidine along with anti gas medication to see if this helps with bloating and we will trial Xifaxin for 2 weeks for IBS. Discussed avoiding potential dietary triggers. If Xifaxin unhelpful then can perform celiac labs, fecal elastase, and sucrase deficiency testing to complete workup.   Nausea, early satiety: Given diabetes gastroparesis remains potential etiology for early satiety, fullness, and nausea. Has failed Reglan and erythromycin in the past. She finds that dramamine otc works well for her. Sees good improvement in symptoms  with smaller/healthier meals. GES normal in 2015. If symptoms began to worsen we could consider repeating GES however given her treatment failure in the past, formal diagnostic testing would likely not change management.   GERD: Well controlled on pantoprazole 40 mg twice daily. Continues to have intermittent dysphagia symptoms but stable overall. Last EGD in July 2023 with mild gastritis. Nausea as noted above.   PLAN   Xifaxin 550mg  TID for 2 weeks Fecal elastase, celiac labs in future if Xifaxin unhelpful.  Request most recent labs form PCP Low FODMAP diet Avoid lactose.  Continue dramamine as needed Continue levsin as needed.  Continue Gas-X as needed and can add famotidine for severe symptoms. Gastroparesis diet Continue pantoprazole 40 mg twice daily Follow up in 3 months.     Brooke Bonito, MSN, FNP-BC, AGACNP-BC [facility]

## 2022-10-18 ENCOUNTER — Ambulatory Visit: Payer: Medicare Other

## 2022-10-18 ENCOUNTER — Telehealth: Payer: Self-pay

## 2022-10-18 ENCOUNTER — Ambulatory Visit (INDEPENDENT_AMBULATORY_CARE_PROVIDER_SITE_OTHER): Payer: Medicare Other | Admitting: Gastroenterology

## 2022-10-18 ENCOUNTER — Encounter: Payer: Self-pay | Admitting: Gastroenterology

## 2022-10-18 VITALS — BP 119/77 | HR 73 | Temp 98.6°F | Ht 62.0 in | Wt 186.2 lb

## 2022-10-18 DIAGNOSIS — K219 Gastro-esophageal reflux disease without esophagitis: Secondary | ICD-10-CM

## 2022-10-18 DIAGNOSIS — R140 Abdominal distension (gaseous): Secondary | ICD-10-CM

## 2022-10-18 DIAGNOSIS — R6881 Early satiety: Secondary | ICD-10-CM

## 2022-10-18 DIAGNOSIS — N390 Urinary tract infection, site not specified: Secondary | ICD-10-CM

## 2022-10-18 DIAGNOSIS — K580 Irritable bowel syndrome with diarrhea: Secondary | ICD-10-CM

## 2022-10-18 DIAGNOSIS — R110 Nausea: Secondary | ICD-10-CM | POA: Diagnosis not present

## 2022-10-18 LAB — MICROSCOPIC EXAMINATION: Bacteria, UA: NONE SEEN

## 2022-10-18 LAB — URINALYSIS, ROUTINE W REFLEX MICROSCOPIC
Bilirubin, UA: NEGATIVE
Glucose, UA: NEGATIVE
Ketones, UA: NEGATIVE
Nitrite, UA: NEGATIVE
Protein,UA: NEGATIVE
RBC, UA: NEGATIVE
Specific Gravity, UA: 1.03 (ref 1.005–1.030)
Urobilinogen, Ur: 0.2 mg/dL (ref 0.2–1.0)
pH, UA: 5 (ref 5.0–7.5)

## 2022-10-18 MED ORDER — RIFAXIMIN 200 MG PO TABS: 200.0000 mg | ORAL_TABLET | Freq: Three times a day (TID) | ORAL | 0 refills | Status: AC

## 2022-10-18 NOTE — Progress Notes (Signed)
Patient presents today with complaints of  UTI sxs.  UA and Culture done today.  Dr. Ronne Binning reviewed results and no treatment .  Patient aware of MD recommendations and that we will reach out with culture results.      [id number], CMA

## 2022-10-18 NOTE — Addendum Note (Signed)
Addended by: Christoper Fabian R on: 10/18/2022 03:21 PM   Modules accepted: Orders

## 2022-10-18 NOTE — Telephone Encounter (Signed)
Patient drop off urine for UTI sxs

## 2022-10-18 NOTE — Patient Instructions (Addendum)
We will try Xifaxin for 2 weeks to see if this helps with diarrhea and bloating.  It may require a prior authorization.  If there is any issues with cost/co-pay please let us know and we can try submitting for patient assistance if needed.  You can continue to take the Levsin as needed even while taking Xifaxan.  We will request your most recent blood work from your primary care.  Avoid gas-producing foods (eg, cabbage, legumes, onions, broccoli, brussel sprouts, wheat, and potatoes).  Try to limit your intake of dairy products, you can preemptively take Lactaid prior to meals to help with gas and see if this improves her symptoms.  When you have severe gas symptoms you can continue taking simethicone and you can also add famotidine.  Gastroparesis recommendations:  4-6 small meals daily Low fat diet Low fiber diet (avoid raw fruits and vegetables).  Continue pantoprazole 40 mg twice daily.  We will follow-up in about 3 months.  Please keep me updated regarding her Xifaxan prescription and if you obtain it please let me know if this is helpful.  It was a pleasure to see you today. I want to create trusting relationships with patients. If you receive a survey regarding your visit,  I greatly appreciate you taking time to fill this out on paper or through your MyChart. I value your feedback.  Brooke Bonito, MSN, FNP-BC, AGACNP-BC [facility]

## 2022-10-18 NOTE — Telephone Encounter (Signed)
Patient has a UTI. She will need an antibiotic. Asking when will she need to start taking maintenance Rx to help with UTI'S since she will need to take the antibiotic for current uti?  [facility] [phone] - [place], [place] - [address] AT [facility] [address] Phone: [phone]  Fax: [phone]     Please advise.

## 2022-10-18 NOTE — Telephone Encounter (Signed)
Patient is made aware urinalysis was clear per Dr. Ronne Binning  and no treatment needed. Patient is made aware her urine been sent for culture. Patient states Kelly Wiley put her on a long term abt and wanted to know if she should started taking her abt.

## 2022-10-19 NOTE — Telephone Encounter (Signed)
Patient is made aware per Maralyn Sago "Yes - Start Keflex 250 mg daily for UTI prevention" Patient voiced understanding

## 2022-10-20 ENCOUNTER — Telehealth: Payer: Self-pay | Admitting: *Deleted

## 2022-10-20 NOTE — Telephone Encounter (Signed)
Xifaxan was approved.

## 2022-10-24 ENCOUNTER — Telehealth: Payer: Self-pay

## 2022-10-24 ENCOUNTER — Other Ambulatory Visit: Payer: Self-pay | Admitting: Urology

## 2022-10-24 DIAGNOSIS — N390 Urinary tract infection, site not specified: Secondary | ICD-10-CM

## 2022-10-24 MED ORDER — AMOXICILLIN-POT CLAVULANATE 500-125 MG PO TABS
1.0000 | ORAL_TABLET | Freq: Two times a day (BID) | ORAL | 0 refills | Status: DC
Start: 2022-10-24 — End: 2023-01-01

## 2022-10-24 NOTE — Telephone Encounter (Signed)
Spoke with patient and she states her symptoms have not gotten any better. Please advise on alternative antibiotic.

## 2022-10-24 NOTE — Telephone Encounter (Signed)
Patient called and made aware.

## 2022-10-24 NOTE — Telephone Encounter (Signed)
-----   Message from Bertram Millard Dahlstedt sent at 10/24/2022  1:01 PM EDT ----- It looks like there a put this lady on cephalexin daily.  Unfortunately, the culture shows that this will not work for the bacteria.  She is allergic to sulfa, cannot give back.  If she is not having symptoms it is okay to continue that cephalexin.  If she does feel like she has an infection we might need to change that, let me know. ----- Message ----- From: Gustavus Messing, LPN Sent: 03/25/1094   7:58 AM EDT To: Marcine Matar, MD  Can you please review in Ms. Larocco FNP absence.

## 2022-10-25 ENCOUNTER — Ambulatory Visit (HOSPITAL_COMMUNITY)
Admission: RE | Admit: 2022-10-25 | Discharge: 2022-10-25 | Disposition: A | Discharge status: Home / Self Care | Payer: Medicare Other | Source: Ambulatory Visit | Attending: Urology | Admitting: Urology

## 2022-10-25 DIAGNOSIS — N200 Calculus of kidney: Secondary | ICD-10-CM | POA: Diagnosis not present

## 2022-10-25 DIAGNOSIS — Z87440 Personal history of urinary (tract) infections: Secondary | ICD-10-CM | POA: Diagnosis not present

## 2022-10-25 DIAGNOSIS — Z87442 Personal history of urinary calculi: Secondary | ICD-10-CM | POA: Diagnosis not present

## 2022-10-25 DIAGNOSIS — N390 Urinary tract infection, site not specified: Secondary | ICD-10-CM

## 2022-10-25 DIAGNOSIS — R93421 Abnormal radiologic findings on diagnostic imaging of right kidney: Secondary | ICD-10-CM | POA: Diagnosis not present

## 2022-10-25 DIAGNOSIS — Z981 Arthrodesis status: Secondary | ICD-10-CM | POA: Diagnosis not present

## 2022-10-29 ENCOUNTER — Other Ambulatory Visit: Payer: Self-pay | Admitting: Gastroenterology

## 2022-11-06 DIAGNOSIS — Z87891 Personal history of nicotine dependence: Secondary | ICD-10-CM | POA: Diagnosis not present

## 2022-11-06 DIAGNOSIS — M47816 Spondylosis without myelopathy or radiculopathy, lumbar region: Secondary | ICD-10-CM | POA: Diagnosis not present

## 2022-11-06 DIAGNOSIS — Z79899 Other long term (current) drug therapy: Secondary | ICD-10-CM | POA: Diagnosis not present

## 2022-11-06 DIAGNOSIS — Z882 Allergy status to sulfonamides status: Secondary | ICD-10-CM | POA: Diagnosis not present

## 2022-11-06 DIAGNOSIS — I10 Essential (primary) hypertension: Secondary | ICD-10-CM | POA: Diagnosis not present

## 2022-11-06 DIAGNOSIS — R569 Unspecified convulsions: Secondary | ICD-10-CM | POA: Diagnosis not present

## 2022-11-06 DIAGNOSIS — Z7951 Long term (current) use of inhaled steroids: Secondary | ICD-10-CM | POA: Diagnosis not present

## 2022-11-06 DIAGNOSIS — K219 Gastro-esophageal reflux disease without esophagitis: Secondary | ICD-10-CM | POA: Diagnosis not present

## 2022-11-06 DIAGNOSIS — Z791 Long term (current) use of non-steroidal anti-inflammatories (NSAID): Secondary | ICD-10-CM | POA: Diagnosis not present

## 2022-11-06 DIAGNOSIS — Z888 Allergy status to other drugs, medicaments and biological substances status: Secondary | ICD-10-CM | POA: Diagnosis not present

## 2022-11-06 DIAGNOSIS — Z794 Long term (current) use of insulin: Secondary | ICD-10-CM | POA: Diagnosis not present

## 2022-11-06 DIAGNOSIS — E119 Type 2 diabetes mellitus without complications: Secondary | ICD-10-CM | POA: Diagnosis not present

## 2022-11-07 DIAGNOSIS — H61002 Unspecified perichondritis of left external ear: Secondary | ICD-10-CM | POA: Diagnosis not present

## 2022-11-10 ENCOUNTER — Other Ambulatory Visit: Payer: Self-pay | Admitting: Urology

## 2022-11-10 ENCOUNTER — Telehealth: Payer: Self-pay

## 2022-11-10 DIAGNOSIS — N390 Urinary tract infection, site not specified: Secondary | ICD-10-CM

## 2022-11-10 DIAGNOSIS — N200 Calculus of kidney: Secondary | ICD-10-CM

## 2022-11-10 NOTE — Telephone Encounter (Signed)
-----   Message from Donnita Falls sent at 11/10/2022  9:26 AM EDT ----- Please let patient know that Dr. Ronne Binning was consulted about her kidney stone / recurrent UTIs (as we discussed at last office visit). He agrees with plan to proceed with ESWL to address stone as a potential infectious nidus. Surgery request order placed today.

## 2022-11-10 NOTE — Progress Notes (Signed)
Dr. Ronne Binning was consulted about patient's right kidney stone / recurrent UTIs. As we discussed at last office visit, he agrees with plan to proceed with ESWL to address stone as a potential infectious nidus. Surgery request order placed today.   Evette Georges, MSN, FNP-C, [facility] Nurse Practitioner [facility] [place]

## 2022-11-10 NOTE — Telephone Encounter (Signed)
Patient is made aware of Sarah recommendation and response. Voiced understanding

## 2022-11-10 NOTE — H&P (View-Only) (Signed)
Dr. Ronne Binning was consulted about patient's right kidney stone / recurrent UTIs. As we discussed at last office visit, he agrees with plan to proceed with ESWL to address stone as a potential infectious nidus. Surgery request order placed today.   Evette Georges, MSN, FNP-C, [facility] Nurse Practitioner [facility] [place]

## 2022-11-22 ENCOUNTER — Other Ambulatory Visit: Payer: Self-pay

## 2022-11-22 DIAGNOSIS — N200 Calculus of kidney: Secondary | ICD-10-CM

## 2022-11-24 ENCOUNTER — Encounter (HOSPITAL_COMMUNITY)
Admission: RE | Admit: 2022-11-24 | Discharge: 2022-11-24 | Disposition: A | Discharge status: Home / Self Care | Payer: Medicare Other | Source: Ambulatory Visit | Attending: Urology | Admitting: Urology

## 2022-11-24 ENCOUNTER — Encounter (HOSPITAL_COMMUNITY): Payer: Self-pay

## 2022-11-24 ENCOUNTER — Other Ambulatory Visit: Payer: Self-pay

## 2022-11-28 ENCOUNTER — Other Ambulatory Visit: Payer: Self-pay | Admitting: Urology

## 2022-11-28 ENCOUNTER — Ambulatory Visit (HOSPITAL_COMMUNITY)
Admission: RE | Admit: 2022-11-28 | Discharge: 2022-11-28 | Disposition: A | Discharge status: Home / Self Care | Payer: Medicare Other | Attending: Urology | Admitting: Urology

## 2022-11-28 ENCOUNTER — Ambulatory Visit (HOSPITAL_COMMUNITY): Payer: Medicare Other

## 2022-11-28 ENCOUNTER — Encounter (HOSPITAL_COMMUNITY)
Admission: RE | Disposition: A | Discharge status: Home / Self Care | Payer: Self-pay | Source: Home / Self Care | Attending: Urology

## 2022-11-28 DIAGNOSIS — E119 Type 2 diabetes mellitus without complications: Secondary | ICD-10-CM | POA: Insufficient documentation

## 2022-11-28 DIAGNOSIS — Z794 Long term (current) use of insulin: Secondary | ICD-10-CM | POA: Insufficient documentation

## 2022-11-28 DIAGNOSIS — I10 Essential (primary) hypertension: Secondary | ICD-10-CM | POA: Insufficient documentation

## 2022-11-28 DIAGNOSIS — E669 Obesity, unspecified: Secondary | ICD-10-CM | POA: Diagnosis not present

## 2022-11-28 DIAGNOSIS — M797 Fibromyalgia: Secondary | ICD-10-CM | POA: Diagnosis not present

## 2022-11-28 DIAGNOSIS — N200 Calculus of kidney: Secondary | ICD-10-CM | POA: Diagnosis present

## 2022-11-28 DIAGNOSIS — Z87440 Personal history of urinary (tract) infections: Secondary | ICD-10-CM | POA: Insufficient documentation

## 2022-11-28 DIAGNOSIS — Z981 Arthrodesis status: Secondary | ICD-10-CM | POA: Diagnosis not present

## 2022-11-28 HISTORY — PX: EXTRACORPOREAL SHOCK WAVE LITHOTRIPSY: SHX1557

## 2022-11-28 LAB — GLUCOSE, CAPILLARY
Glucose-Capillary: 111 mg/dL — ABNORMAL HIGH (ref 70–99)
Glucose-Capillary: 113 mg/dL — ABNORMAL HIGH (ref 70–99)
Glucose-Capillary: 65 mg/dL — ABNORMAL LOW (ref 70–99)
Glucose-Capillary: 144 mg/dL — ABNORMAL HIGH (ref 70–99)

## 2022-11-28 SURGERY — LITHOTRIPSY, ESWL
Anesthesia: LOCAL | Laterality: Right

## 2022-11-28 MED ORDER — DIPHENHYDRAMINE HCL 25 MG PO CAPS
25.0000 mg | ORAL_CAPSULE | ORAL | Status: AC
  Administered 2022-11-28: 25 mg via ORAL
  Filled 2022-11-28: qty 1

## 2022-11-28 MED ORDER — DEXTROSE 50 % IV SOLN
25.0000 mL | Freq: Once | INTRAVENOUS | Status: AC
  Administered 2022-11-28: 25 mL via INTRAVENOUS

## 2022-11-28 MED ORDER — HYDROCODONE-ACETAMINOPHEN 10-325 MG PO TABS: 1.0000 | ORAL_TABLET | Freq: Four times a day (QID) | ORAL | 0 refills | Status: AC | PRN

## 2022-11-28 MED ORDER — DEXTROSE 50 % IV SOLN
INTRAVENOUS | Status: AC
  Filled 2022-11-28: qty 50

## 2022-11-28 MED ORDER — SODIUM CHLORIDE 0.9 % IV SOLN: INTRAVENOUS | Status: DC

## 2022-11-28 NOTE — Progress Notes (Signed)
On arrival to post op finger stick glucose is 65, anesthesia notified patient received  25ml of D50 IV as well as sprite and graham crackers, 25 minutes later recheck of glucose is increased to 144

## 2022-11-28 NOTE — Interval H&P Note (Signed)
History and Physical Interval Note:  11/28/2022 7:55 AM  Kelly Wiley  has presented today for surgery, with the diagnosis of Right Nephrolithiasis.  The various methods of treatment have been discussed with the patient and family. After consideration of risks, benefits and other options for treatment, the patient has consented to  ESWL Left as a surgical intervention.  The patient's history has been reviewed, patient examined, no change in status, stable for surgery.  I have reviewed the patient's chart and labs.  Questions were answered to the patient's satisfaction.     Wilkie Aye

## 2022-11-30 ENCOUNTER — Encounter (HOSPITAL_COMMUNITY): Payer: Self-pay | Admitting: Urology

## 2022-12-19 ENCOUNTER — Encounter: Payer: Medicare Other | Admitting: Urology

## 2022-12-19 DIAGNOSIS — E876 Hypokalemia: Secondary | ICD-10-CM | POA: Diagnosis not present

## 2022-12-19 DIAGNOSIS — E559 Vitamin D deficiency, unspecified: Secondary | ICD-10-CM | POA: Diagnosis not present

## 2022-12-19 DIAGNOSIS — E7849 Other hyperlipidemia: Secondary | ICD-10-CM | POA: Diagnosis not present

## 2022-12-19 DIAGNOSIS — M4726 Other spondylosis with radiculopathy, lumbar region: Secondary | ICD-10-CM | POA: Diagnosis not present

## 2022-12-19 DIAGNOSIS — K219 Gastro-esophageal reflux disease without esophagitis: Secondary | ICD-10-CM | POA: Diagnosis not present

## 2022-12-19 DIAGNOSIS — E1143 Type 2 diabetes mellitus with diabetic autonomic (poly)neuropathy: Secondary | ICD-10-CM | POA: Diagnosis not present

## 2022-12-19 DIAGNOSIS — M47816 Spondylosis without myelopathy or radiculopathy, lumbar region: Secondary | ICD-10-CM | POA: Diagnosis not present

## 2022-12-19 DIAGNOSIS — M961 Postlaminectomy syndrome, not elsewhere classified: Secondary | ICD-10-CM | POA: Diagnosis not present

## 2022-12-26 NOTE — Progress Notes (Signed)
Name: Kelly Wiley DOB: Jul 12, 1955 MRN: [id number]  Diagnoses: 1) Post-operative state  HPI: Kelly Wiley presents post-operatively.  - GU History: 1. Recurrent UTls. - Her typical UTI symptoms include low back pain, bladder pain, increased urinary urgency, frequency. Does not typically experience dysuria with UTI.  - Past history of pyelonephritis in childhood. - Prior episode of urosepsis in 2023 (in [place]). - Previously followed by Dr. Ashley Royalty at [facility]. Cystoscopy was unremarkable in March 2018 and on 05/18/2022.  - Patient's risk factors for UTI include T2DM, vaginal atrophy, occasional fecal incontinence.  - Previously took Nitrofurantoin daily and Ellura supplement for UTI prophylaxis. Currently taking Keflex 250 mg daily for UTI prevention. 2. Vaginal atrophy. - Using topical vaginal estrogen cream 2x/week. 3. Kidney stones.  4. Mixed urinary incontinence; stress predominant. Not significantly bothersome per patient.  Procedure:  She underwent left ESWL procedure on 11/28/2022 by Dr. Ronne Binning for management of a LEFT intrarenal stone, which was thought to be a potential infectious nidus in this patient which history of recurrent UTIs.  Postop course: She did not get KUB prior to appointment today.  Today She reports that over the past few days she has had right flank pain   She denies increased urinary urgency, frequency, nocturia, dysuria, gross hematuria, hesitancy, straining to void, or sensations of incomplete emptying.  Pt denies  abdominal pain.   Fall Screening: Do you usually have a device to assist in your mobility? No   Medications: Current Outpatient Medications  Medication Sig Dispense Refill   carvedilol (COREG) 12.5 MG tablet TAKE (1) TABLET TWICE DAILY. 60 tablet 9   cloNIDine (CATAPRES) 0.1 MG tablet Take 0.1 mg by mouth 2 (two) times daily.     diclofenac (VOLTAREN) 75 MG EC tablet Take by mouth.     dimenhyDRINATE  (MOTION SICKNESS RELIEF) 50 MG tablet Take 50 mg by mouth in the morning and at bedtime.     estradiol (ESTRACE) 0.1 MG/GM vaginal cream Place vaginally.     ezetimibe (ZETIA) 10 MG tablet Take 10 mg by mouth daily.     HUMALOG KWIKPEN 100 UNIT/ML KwikPen 30 Units 2 (two) times daily before a meal.     HYDROcodone-acetaminophen (NORCO) 10-325 MG tablet Take 1 tablet by mouth every 6 (six) hours as needed. 30 tablet 0   hyoscyamine (LEVSIN) 0.125 MG tablet Take 0.125 mg by mouth every 4 (four) hours as needed for cramping.     LANTUS SOLOSTAR 100 UNIT/ML Solostar Pen SMARTSIG:60 Unit(s) SUB-Q Daily     NONFORMULARY OR COMPOUNDED ITEM Ellura supplement: 36 mg of soluble Proanthocyanidin (PAC). Take 1 capsule by mouth once daily in the morning to prevent recurrent UTI.  Can be purchased via online vendors or obtained at a discount by sending this prescription to: [facility] Fax #: [phone] Address: [address] [place], [place], [place] [address] NCPD# [id number] 30 each 11   pantoprazole (PROTONIX) 40 MG tablet Take 40 mg by mouth 2 (two) times daily.     Simethicone (GAS-X PO) Take by mouth.     tiZANidine (ZANAFLEX) 4 MG tablet Take 4 mg by mouth at bedtime.     Vitamin D, Ergocalciferol, (DRISDOL) 1.25 MG (50000 UNIT) CAPS capsule Take 50,000 Units by mouth 2 (two) times a week.     No current facility-administered medications for this visit.    Allergies: Allergies  Allergen Reactions   Ativan [Lorazepam]     Lowered  blood pressure   Iodine 131 Other (See Comments)    Blacked out  Dye*    Phenytoin Sodium Extended Other (See Comments)    Killed white blood cell at age of 16  Dilantin    Sulfa Antibiotics Other (See Comments)    Bones    Zarontin [Ethosuximide]     Pt does not remember reaction     Past Medical History:  Diagnosis Date   Cervical spine disease    DM (diabetes mellitus) (HCC)    Dysphagia    Fibromyalgia    GERD (gastroesophageal reflux  disease)    HTN (hypertension)    Hypercholesteremia    PONV (postoperative nausea and vomiting)    Seasonal allergies    Seizures (HCC)    last one > 20 years ago   Past Surgical History:  Procedure Laterality Date   BACK SURGERY     BALLOON DILATION N/A 09/19/2021   Procedure: BALLOON DILATION;  Surgeon: Lanelle Bal, DO;  [facility];  Service: Endoscopy;  Laterality: N/A;   BIOPSY  09/19/2021   Procedure: BIOPSY;  Surgeon: Lanelle Bal, DO;  [facility];  Service: Endoscopy;;  gastric;random colon;   CARPAL TUNNEL RELEASE  RIGHT   CERVICAL SPINE SURGERY  06/19/2010   COLONOSCOPY  [facility]   COLONOSCOPY WITH PROPOFOL N/A 09/19/2021   Procedure: COLONOSCOPY WITH PROPOFOL;  Surgeon: Lanelle Bal, DO;  [facility];  Service: Endoscopy;  Laterality: N/A;  12:00pm   ESOPHAGOGASTRODUODENOSCOPY  01/24/2011   ZOX:[id number] 2o TO CERVICAL PLATES IN PT'S NECK AND MILD ESO MOTILITY DISORDER/Mild gastritis   ESOPHAGOGASTRODUODENOSCOPY  03/03/2011   WJX:BJYN gastritis    ESOPHAGOGASTRODUODENOSCOPY (EGD) WITH PROPOFOL N/A 09/19/2021   Procedure: ESOPHAGOGASTRODUODENOSCOPY (EGD) WITH PROPOFOL;  Surgeon: Lanelle Bal, DO;  [facility];  Service: Endoscopy;  Laterality: N/A;   EXTRACORPOREAL SHOCK WAVE LITHOTRIPSY Right 11/28/2022   Procedure: EXTRACORPOREAL SHOCK WAVE LITHOTRIPSY (ESWL);  Surgeon: Malen Gauze, MD;  Location: [facility];  Service: Urology;  Laterality: Right;   FLEXIBLE SIGMOIDOSCOPY  01/24/2011   SLF: Internal Hemorrhoids   SAVORY DILATION  01/24/2011   Procedure: SAVORY DILATION;  Surgeon: Arlyce Harman, MD;  [facility];  Service: Endoscopy;  Laterality: N/A;   SHOULDER SURGERY  RIGHT   TUBAL LIGATION     Family History  Problem Relation Age of Onset   Lung cancer Mother    Heart disease Mother        Mitral valve disease   Congenital heart disease Son        has a PPM    Diabetes Son    Colon cancer Neg Hx    Colon polyps Neg Hx    Social History   Socioeconomic History   Marital status: Widowed    Spouse name: Not on file   Number of children: Not on file   Years of education: Not on file   Highest education level: Not on file  Occupational History   Not on file  Tobacco Use   Smoking status: Former    Current packs/day: 0.00    Average packs/day: 0.5 packs/day for 2.0 years (1.0 ttl pk-yrs)    Types: Cigarettes    Start date: 10/12/1966    Quit date: 10/11/1968    Years since quitting: 54.2   Smokeless tobacco: Never  Vaping Use   Vaping status: Never Used  Substance and Sexual Activity  Alcohol use: Yes    Alcohol/week: 0.0 standard drinks of alcohol    Comment: occasionally   Drug use: No   Sexual activity: Not Currently  Other Topics Concern   Not on file  Social History Narrative   MARRIED, 2 KIDS, YOUNGEST 69 YO.   Son has DM, malabsorption   Works in Audiological scientist in [place]   EtOH: rare   No tobacco products   Social Determinants of Corporate investment banker Strain: Not on file  Food Insecurity: No Food Insecurity (11/11/2021)   Received from [facility], [facility]   Hunger Vital Sign    Worried About Running Out of Food in the Last Year: Never true    Ran Out of Food in the Last Year: Never true  Transportation Needs: No Transportation Needs (01/19/2022)   Received [facility], [facility]   PRAPARE - Transportation    Lack of Transportation (Medical): No    Lack of Transportation (Non-Medical): No  Physical Activity: Not on file  Stress: No Stress Concern Present (11/02/2020)   Received from [facility], [facility] - Occupational Stress Questionnaire    Feeling of Stress : Not at all  Social Connections: Unknown (07/28/2021)   [facility] from [facility], [facility]   Social Network    Social Network: Not on file  Intimate Partner Violence:  Unknown (06/23/2021)   Received from [facility], [facility]   HITS    Physically Hurt: Not on file    Insult or Talk Down To: Not on file    Threaten Physical Harm: Not on file    Scream or Curse: Not on file    SUBJECTIVE  Review of Systems Constitutional: Patient denies any unintentional weight loss or change in strength lntegumentary: Patient denies any rashes or pruritus Cardiovascular: Patient denies chest pain or syncope Respiratory: Patient denies shortness of breath Gastrointestinal: Patient denies vomiting, constipation, or diarrhea. Reports chronic intermittent nausea.  Musculoskeletal: Patient denies muscle cramps or weakness Neurologic: Patient denies convulsions or seizures Allergic/Immunologic: Patient denies recent allergic reaction(s) Hematologic/Lymphatic: Patient denies bleeding tendencies Endocrine: Patient denies heat/cold intolerance  GU: As per HPI.  OBJECTIVE Vitals:   01/01/23 1357  BP: (!) 142/83  Pulse: 82  Temp: 98.2 F (36.8 C)   Body mass index is 33.84 kg/m.  Physical Examination Constitutional: No obvious distress; patient is non-toxic appearing  Cardiovascular: No visible lower extremity edema.  Respiratory: The patient does not have audible wheezing/stridor; respirations do not appear labored  Gastrointestinal: Abdomen non-distended Musculoskeletal: Normal ROM of UEs  Skin: No obvious rashes/open sores  Neurologic: CN 2-12 grossly intact Psychiatric: Answered questions appropriately with normal affect  Hematologic/Lymphatic/Immunologic: No obvious bruises or sites of spontaneous bleeding  UA: >30 WBC/hpf, 11-30 RBC/hpf, bacteria (many)  ASSESSMENT Recurrent UTI - Plan: Urine Culture  Kidney stones - Plan: Urinalysis, Routine w reflex microscopic, US RENAL  Atrophic vaginitis  Postop check  Right flank pain  We reviewed the operative procedures and findings. Advised KUB & RUS this week to re-assess stone burden. We  discussed possible etiologies for her right flank pain such as stone, musculoskeletal pain, etc. She was advised that we will notify her of recommendations following review of imaging results; will plan for Urology follow up in 3 months if no acute findings.   We discussed her abnormal UA today. Other than her right flank pain she is asymptomatic for UTI, therefore we agreed to check  urine culture and treat as indicated based on results. We discussed goal for antibiotic stewardship to minimize her risk for developing antibiotic resistance pathogens. Advised to continue routine use of topical vaginal estrogen cream and Keflex 250 mg daily for UTI prevention.   Pt verbalized understanding and agreement. All questions were answered.  PLAN Advised the following: 1. Urine culture. 2. RUS & KUB. 3. Continue Keflex 250 mg daily for UTI prevention. 4. Continue topical vaginal estrogen cream use 2x/week. 5. Return in about 3 months (around 04/03/2023) for UA, PVR, & f/u with Evette Georges NP.   Orders Placed This Encounter  Procedures   Urine Culture   US RENAL    Standing Status:   Future    Standing Expiration Date:   01/01/2024    Order Specific Question:   Reason for Exam (SYMPTOM  OR DIAGNOSIS REQUIRED)    Answer:   right flank pain; stones    Order Specific Question:   Preferred imaging location?    Answer:   [facility]   Urinalysis, Routine w reflex microscopic    It has been explained that the patient is to follow regularly with their PCP in addition to all other providers involved in their care and to follow instructions provided by these respective offices. Patient advised to contact urology clinic if any urologic-pertaining questions, concerns, new symptoms or problems arise in the interim period.  There are no Patient Instructions on file for this visit.  Electronically signed by:  Donnita Falls, MSN, FNP-C, CUNP 01/01/2023 2:32 PM

## 2022-12-26 NOTE — H&P (Signed)
History of Present Illness: Kelly Wiley is a 67 y.o. female who presents today as a new patient at [facility] [place]. All available relevant medical records have been reviewed.  - GU History: 1. Recurrent UTls. Previously followed by Dr. Ashley Royalty at [facility]. Per chart review:  - Patient reported history of pyelonephritis in childhood and an episode of urosepsis in [place] in 2023. - Risk factors for UTI: T2DM, vaginal atrophy, occasional fecal incontinence when her IBS flares up with watery stool.  - Cystoscopy unremarkable in March 2018 and on 05/18/2022.  - Previously treated with daily low dose Nitrofurantoin for UTI prophylaxis. Previously educated about OTC supplement recommendations for UTI prevention. 2. Vaginal atrophy. - Using topical vaginal estrogen cream 2x/week. 3. Kidney stones.  - RUS on 02/02/2022 showed a non-obstructing 5 mm left kidney stone. 4. Mixed stress and urge urinary incontinence. Not significantly bothersome.   Urine culture results in past 12 months: - 02/02/2022: Positive for >100k E. coli - 03/14/2022: Positive for Klebsiella aerogenes - 03/28/2022: Positive for E. coli - 04/19/2022: Positive for E. coli - 05/18/2022: Positive for E. coli  - 07/18/2022: Positive for E. coli - 08/10/2022: Positive for E. coli - 08/21/2022: Positive for E. coli   Today: She reports that when she's on antibiotics her symptoms are well controlled but then recur within a couple days after completing the antibiotics. She reports that her typical UTI symptoms include low back pain, bladder pain, increased urinary urgency, frequency. Does not typically experience dysuria with UTI.   At baseline she denies bladder pain, urgency, dysuria, gross hematuria, hesitancy, straining to void, or sensations of incomplete emptying. She reports baseline urinary frequency - voids 6-8x/day. She leaks small volumes of urine several times per day, which she states  is not significantly bothersome; wears pads. She reports the SUI is predominant. Reports occasional nocturia, which she states is manageable.   Reports some "pressure" in flank areas. Denies abdominal pain. Denies fever.     Fall Screening: Do you usually have a device to assist in your mobility? No    Medications:       Current Outpatient Medications  Medication Sig Dispense Refill   carvedilol (COREG) 12.5 MG tablet TAKE (1) TABLET TWICE DAILY. 60 tablet 9   cephALEXin (KEFLEX) 250 MG capsule Take 1 capsule (250 mg total) by mouth daily. 30 capsule 11   cloNIDine (CATAPRES) 0.1 MG tablet Take 0.1 mg by mouth 2 (two) times daily.       diclofenac (VOLTAREN) 75 MG EC tablet Take by mouth.       dimenhyDRINATE (MOTION SICKNESS RELIEF) 50 MG tablet Take 50 mg by mouth in the morning and at bedtime.       estradiol (ESTRACE) 0.1 MG/GM vaginal cream Place vaginally.       ezetimibe (ZETIA) 10 MG tablet Take 10 mg by mouth daily.       HUMALOG KWIKPEN 100 UNIT/ML KwikPen 30 Units 2 (two) times daily before a meal.       HYDROcodone-acetaminophen (NORCO) 10-325 MG tablet Take 1 tablet by mouth 2 (two) times daily as needed.       hyoscyamine (LEVSIN) 0.125 MG tablet Take 0.125 mg by mouth every 4 (four) hours as needed for cramping.       LANTUS SOLOSTAR 100 UNIT/ML Solostar Pen SMARTSIG:60 Unit(s) SUB-Q Daily       NONFORMULARY OR COMPOUNDED ITEM Ellura supplement: 36 mg of soluble Proanthocyanidin (PAC). Take 1 capsule  by mouth once daily in the morning to prevent recurrent UTI.   Can be purchased via online vendors or obtained at a discount by sending this prescription to: [facility] Fax #: [phone] Address: [address] [place], [place], [place] [address] NCPD# [id number] 30 each 11   pantoprazole (PROTONIX) 40 MG tablet Take 40 mg by mouth 2 (two) times daily.       tiZANidine (ZANAFLEX) 4 MG tablet Take 4 mg by mouth at bedtime.       Vitamin D, Ergocalciferol, (DRISDOL)  1.25 MG (50000 UNIT) CAPS capsule Take 50,000 Units by mouth 2 (two) times a week.          No current facility-administered medications for this visit.        Allergies: Allergies       Allergies  Allergen Reactions   Ativan [Lorazepam]        Lowered blood pressure   Iodine 131 Other (See Comments)      Blacked out   Dye*     Phenytoin Sodium Extended Other (See Comments)      Killed white blood cell at age of 8   Dilantin    Sulfa Antibiotics Other (See Comments)      Bones     Zarontin [Ethosuximide]        Pt does not remember reaction             Past Medical History:  Diagnosis Date   Cervical spine disease     DM (diabetes mellitus) (HCC)     Dysphagia     Fibromyalgia     GERD (gastroesophageal reflux disease)     HTN (hypertension)     Hypercholesteremia     PONV (postoperative nausea and vomiting)     Seasonal allergies     Seizures (HCC)      last one > 20 years ago             Past Surgical History:  Procedure Laterality Date   BACK SURGERY       BALLOON DILATION N/A 09/19/2021    Procedure: BALLOON DILATION;  Surgeon: Lanelle Bal, DO;  [facility];  Service: Endoscopy;  Laterality: N/A;   BIOPSY   09/19/2021    Procedure: BIOPSY;  Surgeon: Lanelle Bal, DO;  [facility];  Service: Endoscopy;;  gastric;random colon;   CARPAL TUNNEL RELEASE   RIGHT   CERVICAL SPINE SURGERY   06/19/2010   COLONOSCOPY   2008 [facility]     FLEISCHMAN NL   COLONOSCOPY WITH PROPOFOL N/A 09/19/2021    Procedure: COLONOSCOPY WITH PROPOFOL;  Surgeon: Lanelle Bal, DO;  [facility];  Service: Endoscopy;  Laterality: N/A;  12:00pm   ESOPHAGOGASTRODUODENOSCOPY   01/24/2011    EXB:[id number] 2o TO CERVICAL PLATES IN PT'S NECK AND MILD ESO MOTILITY DISORDER/Mild gastritis   ESOPHAGOGASTRODUODENOSCOPY   03/03/2011    NUU:VOZD gastritis    ESOPHAGOGASTRODUODENOSCOPY (EGD) WITH PROPOFOL N/A 09/19/2021    Procedure:  ESOPHAGOGASTRODUODENOSCOPY (EGD) WITH PROPOFOL;  Surgeon: Lanelle Bal, DO;  [facility];  Service: Endoscopy;  Laterality: N/A;   FLEXIBLE SIGMOIDOSCOPY   01/24/2011    SLF: Internal Hemorrhoids   SAVORY DILATION   01/24/2011    Procedure: SAVORY DILATION;  Surgeon: Arlyce Harman, MD;  [facility];  Service: Endoscopy;  Laterality: N/A;   SHOULDER SURGERY   RIGHT   TUBAL LIGATION  Family History  Problem Relation Age of Onset   Lung cancer Mother     Heart disease Mother          Mitral valve disease   Congenital heart disease Son          has a PPM   Diabetes Son     Colon cancer Neg Hx     Colon polyps Neg Hx          Social History         Socioeconomic History   Marital status: Widowed      Spouse name: Not on file   Number of children: Not on file   Years of education: Not on file   Highest education level: Not on file  Occupational History   Not on file  Tobacco Use   Smoking status: Former      Current packs/day: 0.00      Average packs/day: 0.5 packs/day for 2.0 years (1.0 ttl pk-yrs)      Types: Cigarettes      Start date: 10/12/1966      Quit date: 10/11/1968      Years since quitting: 54.0   Smokeless tobacco: Never  Vaping Use   Vaping status: Never Used  Substance and Sexual Activity   Alcohol use: Yes      Alcohol/week: 0.0 standard drinks of alcohol      Comment: occasionally   Drug use: No   Sexual activity: Not Currently  Other Topics Concern   Not on file  Social History Narrative    MARRIED, 2 KIDS, YOUNGEST 15 YO.    Son has DM, malabsorption    Works in Audiological scientist in [place]    EtOH: rare    No tobacco products    Social Determinants of Acupuncturist Strain: Not on file  Food Insecurity: No Food Insecurity (11/11/2021)    Received from [facility], [facility]    Hunger Vital Sign     Worried About Running Out of Food in the Last Year: Never true     Ran Out  of Food in the Last Year: Never true  Transportation Needs: No Transportation Needs (01/19/2022)    Received from [facility], [facility]    PRAPARE - Transportation     Lack of Transportation (Medical): No     Lack of Transportation (Non-Medical): No  Physical Activity: Not on file  Stress: No Stress Concern Present (11/02/2020)    Received from [facility], [facility] - Occupational Stress Questionnaire     Feeling of Stress : Not at all  Social Connections: Unknown (07/28/2021)    Received [facility], [facility]    Social Network     Social Network: Not on file  Intimate Partner Violence: Unknown (06/23/2021)    Received from [facility], [facility]    HITS     Physically Hurt: Not on file     Insult or Talk Down To: Not on file     Threaten Physical Harm: Not on file     Scream or Curse: Not on file      SUBJECTIVE   Review of Systems Constitutional: Patient reports general malaise lntegumentary: Patient denies any rashes or pruritus Cardiovascular: Patient denies chest pain or syncope Respiratory: Patient denies shortness of breath Gastrointestinal: Patient denies nausea, vomiting, constipation. Reports chronic diarrhea and recently some green/black colored  stools. States she will be following up with GI about that soon. Musculoskeletal: Patient denies muscle cramps Neurologic: Patient denies convulsions or seizures Psychiatric: Patient denies memory problems Allergic/Immunologic: Patient denies recent allergic reaction(s) Hematologic/Lymphatic: Patient denies bleeding tendencies Endocrine: Patient denies heat/cold intolerance   GU: As per HPI.   OBJECTIVE    Vitals:    10/17/22 0959  BP: (!) 144/84  Pulse: 76  Temp: 98.4 F (36.9 C)    There is no height or weight on file to calculate BMI.   Physical Examination  Constitutional: No obvious distress; patient is non-toxic appearing   Cardiovascular: No visible lower extremity edema.  Respiratory: The patient does not have audible wheezing/stridor; respirations do not appear labored  Gastrointestinal: Abdomen non-distended Musculoskeletal: Normal ROM of UEs  Skin: No obvious rashes/open sores  Neurologic: CN 2-12 grossly intact Psychiatric: Answered questions appropriately with normal affect  Hematologic/Lymphatic/Immunologic: No obvious bruises or sites of spontaneous bleeding   UA & PVR: patient felt unable to void in office today; took cup for at-home urine specimen collection    ASSESSMENT Recurrent UTI - Plan: DG Abd 1 View, US RENAL, cephALEXin (KEFLEX) 250 MG capsule, NONFORMULARY OR COMPOUNDED ITEM, CANCELED: Urinalysis, Routine w reflex microscopic, CANCELED: BLADDER SCAN AMB NON-IMAGING   Kidney stones - Plan: DG Abd 1 View, US RENAL, CANCELED: Urinalysis, Routine w reflex microscopic, CANCELED: BLADDER SCAN AMB NON-IMAGING   Atrophic vaginitis - Plan: CANCELED: Urinalysis, Routine w reflex microscopic, CANCELED: BLADDER SCAN AMB NON-IMAGING   Mixed stress and urge urinary incontinence - Plan: CANCELED: Urinalysis, Routine w reflex microscopic, CANCELED: BLADDER SCAN AMB NON-IMAGING   Irritable bowel syndrome with diarrhea   We discussed the possible etiologies of recurrent UTls including ascending infection related to intercourse; vaginal atrophy; transmural infection that has been treated incompletely; urinary tract stones; incomplete bladder emptying with urinary stasis; kidney or bladder tumor; urethral diverticulum; and colonization of  vagina and urinary tract with pathologic, adherent organisms.    For UTI prevention we discussed options including: Adequate fluid intake (>1.5 liters/day) to flush out the urinary tract. - Go to the bathroom to urinate every 4-6 hours while awake to minimize urinary stasis / bacterial overgrowth in the bladder. - Proanthocyanidin (PAC) supplement 36 mg daily; must be  soluble (insoluble form of PAC will be ineffective). Recommend Ellura. Vitamin C supplement Probiotic to maintain healthy vaginal microbiome - Topical vaginal estrogen for vaginal atrophy. The etiology and consequences of urogenital epithelial atrophy was explained to patient. The thinning of the epithelium of the urethra can contribute to urinary urgency and frequency syndromes. In addition, the normal bacterial flora that colonizes the perineum may contribute to UTI risk because the thin urethral epithelium allows the bacteria to become adherent and the change in vaginal pH can disrupt the vaginal / urethral microbiome and allow for bacterial overgrowth. Will continue using topical vaginal estrogen cream at least 2x/week. - UTI prophylaxis with a daily low dose antibiotic. We discussed the potential risks of prolonged antibiotic treatment particularly with the risks of developing antibiotic resistance. For daily low dose Nitrofurantoin use we discussed risks including pulmonary and liver fibrosis, therefore we agreed to use Keflex 250 mg daily instead.   Advised pt not to use D-mannose for UTI prevention due to sugar content given patient's history of diabetes.   For management of acute UTI symptoms: - Patient was advised that if/when UTI-like symptoms occur they can call our office to speak with a nurse, who can place an order for  urinalysis (with reflex to urine culture). They may then proceed to a [facility] to provide their urine sample. This is required for evaluation in order for their Urology provider to make informed treatment recommendation(s), which may or may not include an antibiotic prescription.   She has had prior normal cystoscopy evaluations x2 by Dr. Ashley Royalty.    We discussed the possibility that her known kidney stone may be an infectious nidus. Agreed to proceed with RUS & KUB for recheck and discussed option to proceed with ESWL to eliminate stone burden in hopes that  will minimize ongoing UTI recurrence. We discussed that procedure in detail including possible risks and benefits such as pain, infection, sepsis, UTI, ureter perforation, need for stenting, post-op ureteral stricture, hematuria, bruising. She elected to proceed with ESWL if Dr. Ronne Binning is in agreement following review of imaging results.    All questions were answered.     PLAN -We discussed the management of kidney stones. These options include observation, ureteroscopy, shockwave lithotripsy (ESWL) and percutaneous nephrolithotomy (PCNL). We discussed which options are relevant to the patient's stone(s). We discussed the natural history of kidney stones as well as the complications of untreated stones and the impact on quality of life without treatment as well as with each of the above listed treatments. We also discussed the efficacy of each treatment in its ability to clear the stone burden. With any of these management options I discussed the signs and symptoms of infection and the need for emergent treatment should these be experienced. For each option we discussed the ability of each procedure to clear the patient of their stone burden.   For observation I described the risks which include but are not limited to silent renal damage, life-threatening infection, need for emergent surgery, failure to pass stone and pain.   For ureteroscopy I described the risks which include bleeding, infection, damage to contiguous structures, positioning injury, ureteral stricture, ureteral avulsion, ureteral injury, need for prolonged ureteral stent, inability to perform ureteroscopy, need for an interval procedure, inability to clear stone burden, stent discomfort/pain, heart attack, stroke, pulmonary embolus and the inherent risks with general anesthesia.   For shockwave lithotripsy I described the risks which include arrhythmia, kidney contusion, kidney hemorrhage, need for transfusion, pain, inability to  adequately break up stone, inability to pass stone fragments, Steinstrasse, infection associated with obstructing stones, need for alternate surgical procedure, need for repeat shockwave lithotripsy, MI, CVA, PE and the inherent risks with anesthesia/conscious sedation.   For PCNL I described the risks including positioning injury, pneumothorax, hydrothorax, need for chest tube, inability to clear stone burden, renal laceration, arterial venous fistula or malformation, need for embolization of kidney, loss of kidney or renal function, need for repeat procedure, need for prolonged nephrostomy tube, ureteral avulsion, MI, CVA, PE and the inherent risks of general anesthesia.   - The patient would like to proceed with ESWL

## 2022-12-27 DIAGNOSIS — Z6832 Body mass index (BMI) 32.0-32.9, adult: Secondary | ICD-10-CM | POA: Diagnosis not present

## 2022-12-27 DIAGNOSIS — G720 Drug-induced myopathy: Secondary | ICD-10-CM | POA: Diagnosis not present

## 2022-12-27 DIAGNOSIS — E1165 Type 2 diabetes mellitus with hyperglycemia: Secondary | ICD-10-CM | POA: Diagnosis not present

## 2022-12-27 DIAGNOSIS — Z0001 Encounter for general adult medical examination with abnormal findings: Secondary | ICD-10-CM | POA: Diagnosis not present

## 2022-12-27 DIAGNOSIS — I10 Essential (primary) hypertension: Secondary | ICD-10-CM | POA: Diagnosis not present

## 2022-12-27 DIAGNOSIS — E782 Mixed hyperlipidemia: Secondary | ICD-10-CM | POA: Diagnosis not present

## 2022-12-27 DIAGNOSIS — E7849 Other hyperlipidemia: Secondary | ICD-10-CM | POA: Diagnosis not present

## 2022-12-27 DIAGNOSIS — M545 Low back pain, unspecified: Secondary | ICD-10-CM | POA: Diagnosis not present

## 2022-12-27 DIAGNOSIS — Z23 Encounter for immunization: Secondary | ICD-10-CM | POA: Diagnosis not present

## 2023-01-01 ENCOUNTER — Ambulatory Visit (INDEPENDENT_AMBULATORY_CARE_PROVIDER_SITE_OTHER): Payer: Medicare Other | Admitting: Urology

## 2023-01-01 ENCOUNTER — Encounter: Payer: Self-pay | Admitting: Urology

## 2023-01-01 ENCOUNTER — Ambulatory Visit (HOSPITAL_COMMUNITY)
Admission: RE | Admit: 2023-01-01 | Discharge: 2023-01-01 | Disposition: A | Discharge status: Home / Self Care | Payer: Medicare Other | Source: Ambulatory Visit | Attending: Urology | Admitting: Urology

## 2023-01-01 VITALS — BP 142/83 | HR 82 | Temp 98.2°F | Ht 62.0 in | Wt 185.0 lb

## 2023-01-01 DIAGNOSIS — Z87442 Personal history of urinary calculi: Secondary | ICD-10-CM

## 2023-01-01 DIAGNOSIS — N390 Urinary tract infection, site not specified: Secondary | ICD-10-CM | POA: Diagnosis not present

## 2023-01-01 DIAGNOSIS — Z981 Arthrodesis status: Secondary | ICD-10-CM | POA: Diagnosis not present

## 2023-01-01 DIAGNOSIS — N2889 Other specified disorders of kidney and ureter: Secondary | ICD-10-CM | POA: Diagnosis not present

## 2023-01-01 DIAGNOSIS — Z09 Encounter for follow-up examination after completed treatment for conditions other than malignant neoplasm: Secondary | ICD-10-CM | POA: Diagnosis not present

## 2023-01-01 DIAGNOSIS — N200 Calculus of kidney: Secondary | ICD-10-CM

## 2023-01-01 DIAGNOSIS — R109 Unspecified abdominal pain: Secondary | ICD-10-CM | POA: Insufficient documentation

## 2023-01-01 DIAGNOSIS — N952 Postmenopausal atrophic vaginitis: Secondary | ICD-10-CM

## 2023-01-01 LAB — URINALYSIS, ROUTINE W REFLEX MICROSCOPIC
Bilirubin, UA: NEGATIVE
Glucose, UA: NEGATIVE
Nitrite, UA: NEGATIVE
Specific Gravity, UA: 1.03 (ref 1.005–1.030)
Urobilinogen, Ur: 1 mg/dL (ref 0.2–1.0)
pH, UA: 5.5 (ref 5.0–7.5)

## 2023-01-01 LAB — MICROSCOPIC EXAMINATION: WBC, UA: >30 /[HPF] — AB (ref 0–5)

## 2023-01-04 LAB — URINE CULTURE

## 2023-01-05 NOTE — Progress Notes (Signed)
Letter sent.

## 2023-01-08 NOTE — Telephone Encounter (Signed)
Please see pt message below.

## 2023-01-09 ENCOUNTER — Other Ambulatory Visit: Payer: Self-pay | Admitting: Urology

## 2023-01-09 DIAGNOSIS — R109 Unspecified abdominal pain: Secondary | ICD-10-CM

## 2023-01-09 DIAGNOSIS — N200 Calculus of kidney: Secondary | ICD-10-CM

## 2023-01-10 ENCOUNTER — Ambulatory Visit (HOSPITAL_COMMUNITY)
Admission: RE | Admit: 2023-01-10 | Discharge: 2023-01-10 | Disposition: A | Discharge status: Home / Self Care | Payer: Medicare Other | Source: Ambulatory Visit | Attending: Urology | Admitting: Urology

## 2023-01-10 DIAGNOSIS — K7689 Other specified diseases of liver: Secondary | ICD-10-CM | POA: Diagnosis not present

## 2023-01-10 DIAGNOSIS — R109 Unspecified abdominal pain: Secondary | ICD-10-CM | POA: Insufficient documentation

## 2023-01-10 DIAGNOSIS — N200 Calculus of kidney: Secondary | ICD-10-CM | POA: Insufficient documentation

## 2023-01-10 DIAGNOSIS — K8689 Other specified diseases of pancreas: Secondary | ICD-10-CM | POA: Diagnosis not present

## 2023-01-10 DIAGNOSIS — K7460 Unspecified cirrhosis of liver: Secondary | ICD-10-CM | POA: Diagnosis not present

## 2023-01-10 DIAGNOSIS — N202 Calculus of kidney with calculus of ureter: Secondary | ICD-10-CM | POA: Diagnosis not present

## 2023-01-11 ENCOUNTER — Ambulatory Visit (HOSPITAL_COMMUNITY): Payer: Medicare Other

## 2023-01-15 DIAGNOSIS — M961 Postlaminectomy syndrome, not elsewhere classified: Secondary | ICD-10-CM | POA: Diagnosis not present

## 2023-01-15 DIAGNOSIS — M4726 Other spondylosis with radiculopathy, lumbar region: Secondary | ICD-10-CM | POA: Diagnosis not present

## 2023-01-15 DIAGNOSIS — E119 Type 2 diabetes mellitus without complications: Secondary | ICD-10-CM | POA: Diagnosis not present

## 2023-01-15 DIAGNOSIS — Z9109 Other allergy status, other than to drugs and biological substances: Secondary | ICD-10-CM | POA: Diagnosis not present

## 2023-01-15 DIAGNOSIS — Z79899 Other long term (current) drug therapy: Secondary | ICD-10-CM | POA: Diagnosis not present

## 2023-01-15 DIAGNOSIS — Z87892 Personal history of anaphylaxis: Secondary | ICD-10-CM | POA: Diagnosis not present

## 2023-01-15 DIAGNOSIS — M51362 Other intervertebral disc degeneration, lumbar region with discogenic back pain and lower extremity pain: Secondary | ICD-10-CM | POA: Diagnosis not present

## 2023-01-15 DIAGNOSIS — Z888 Allergy status to other drugs, medicaments and biological substances status: Secondary | ICD-10-CM | POA: Diagnosis not present

## 2023-01-15 DIAGNOSIS — Z882 Allergy status to sulfonamides status: Secondary | ICD-10-CM | POA: Diagnosis not present

## 2023-01-15 DIAGNOSIS — Z87891 Personal history of nicotine dependence: Secondary | ICD-10-CM | POA: Diagnosis not present

## 2023-01-15 DIAGNOSIS — Z794 Long term (current) use of insulin: Secondary | ICD-10-CM | POA: Diagnosis not present

## 2023-01-15 DIAGNOSIS — Z7951 Long term (current) use of inhaled steroids: Secondary | ICD-10-CM | POA: Diagnosis not present

## 2023-01-22 ENCOUNTER — Telehealth: Payer: Self-pay

## 2023-01-22 NOTE — Telephone Encounter (Signed)
Patient is aware of NP's response.  Patient states she was having pain, she is unsure if she passed a stone but is no longer having pain.  Patient instructed to call back if she has any new pain and we will move her follow up sooner if needed.  Patient voiced understanding.

## 2023-01-22 NOTE — Telephone Encounter (Signed)
-----   Message from Donnita [place] sent at 01/22/2023 12:53 PM EST ----- Please let pt know CT stone from 01/10/2023 showed small bilateral kidney stones with no hydronephrosis; no ureteral stones. Nothing to suggest urologic etiology for her right flank pain.

## 2023-01-24 DIAGNOSIS — R300 Dysuria: Secondary | ICD-10-CM | POA: Diagnosis not present

## 2023-01-25 DIAGNOSIS — M549 Dorsalgia, unspecified: Secondary | ICD-10-CM | POA: Diagnosis not present

## 2023-01-25 DIAGNOSIS — Z6832 Body mass index (BMI) 32.0-32.9, adult: Secondary | ICD-10-CM | POA: Diagnosis not present

## 2023-01-25 DIAGNOSIS — E119 Type 2 diabetes mellitus without complications: Secondary | ICD-10-CM | POA: Diagnosis not present

## 2023-01-25 DIAGNOSIS — R300 Dysuria: Secondary | ICD-10-CM | POA: Diagnosis not present

## 2023-01-25 DIAGNOSIS — I10 Essential (primary) hypertension: Secondary | ICD-10-CM | POA: Diagnosis not present

## 2023-01-25 DIAGNOSIS — Z794 Long term (current) use of insulin: Secondary | ICD-10-CM | POA: Diagnosis not present

## 2023-01-30 DIAGNOSIS — Z6832 Body mass index (BMI) 32.0-32.9, adult: Secondary | ICD-10-CM | POA: Diagnosis not present

## 2023-01-30 DIAGNOSIS — I10 Essential (primary) hypertension: Secondary | ICD-10-CM | POA: Diagnosis not present

## 2023-01-30 DIAGNOSIS — N3090 Cystitis, unspecified without hematuria: Secondary | ICD-10-CM | POA: Diagnosis not present

## 2023-01-30 DIAGNOSIS — M545 Low back pain, unspecified: Secondary | ICD-10-CM | POA: Diagnosis not present

## 2023-02-09 DIAGNOSIS — Z6832 Body mass index (BMI) 32.0-32.9, adult: Secondary | ICD-10-CM | POA: Diagnosis not present

## 2023-02-09 DIAGNOSIS — I10 Essential (primary) hypertension: Secondary | ICD-10-CM | POA: Diagnosis not present

## 2023-02-09 DIAGNOSIS — M545 Low back pain, unspecified: Secondary | ICD-10-CM | POA: Diagnosis not present

## 2023-02-09 DIAGNOSIS — E1143 Type 2 diabetes mellitus with diabetic autonomic (poly)neuropathy: Secondary | ICD-10-CM | POA: Diagnosis not present

## 2023-02-09 DIAGNOSIS — M5432 Sciatica, left side: Secondary | ICD-10-CM | POA: Diagnosis not present

## 2023-02-13 ENCOUNTER — Telehealth: Payer: Self-pay | Admitting: Gastroenterology

## 2023-02-13 ENCOUNTER — Encounter: Payer: Self-pay | Admitting: Gastroenterology

## 2023-02-13 ENCOUNTER — Ambulatory Visit (INDEPENDENT_AMBULATORY_CARE_PROVIDER_SITE_OTHER): Payer: Medicare Other | Admitting: Gastroenterology

## 2023-02-13 VITALS — BP 146/83 | HR 67 | Temp 98.2°F | Ht 62.0 in | Wt 182.4 lb

## 2023-02-13 DIAGNOSIS — K219 Gastro-esophageal reflux disease without esophagitis: Secondary | ICD-10-CM | POA: Diagnosis not present

## 2023-02-13 DIAGNOSIS — K580 Irritable bowel syndrome with diarrhea: Secondary | ICD-10-CM

## 2023-02-13 DIAGNOSIS — R6881 Early satiety: Secondary | ICD-10-CM

## 2023-02-13 DIAGNOSIS — R110 Nausea: Secondary | ICD-10-CM

## 2023-02-13 DIAGNOSIS — R140 Abdominal distension (gaseous): Secondary | ICD-10-CM

## 2023-02-13 NOTE — Patient Instructions (Addendum)
Continue pantoprazole 40 mg twice daily.   Copntienu dramamine as needed for nausea. Continue levsin as needed.  Continue to avoid lactose containing products is much as possible.  We can consider testing for pancreatic insufficiency and alpha gal after the first of the year per your preference.  I will send a reminder for [place] to send you these lab slips after the first of the year.  Gastroparesis recommendations:  4-6 small meals daily Low fat diet Low fiber diet (avoid raw fruits and vegetables).  Follow up in 4 months.   It was a pleasure to see you today. I want to create trusting relationships with patients. If you receive a survey regarding your visit,  I greatly appreciate you taking time to fill this out on paper or through your MyChart. I value your feedback.  Brooke Bonito, MSN, FNP-BC, AGACNP-BC [facility]

## 2023-02-13 NOTE — Telephone Encounter (Signed)
Please recall for pancreatic elastase and alpha gal panel in January.  You can send her labs to her after March 21, 2023.  Brooke Bonito, MSN, APRN, FNP-BC, AGACNP-BC [facility]

## 2023-02-13 NOTE — Progress Notes (Signed)
GI Office Note    Referring Provider: Lianne Moris, PA-C Primary Care Physician:  Lianne Moris, PA-C Primary Gastroenterologist: Hennie Duos. Marletta Lor, DO  Date:  02/13/2023  ID:  Kelly Wiley, DOB [birth year], MRN [id number]   Chief Complaint   Chief Complaint  Patient presents with   Follow-up    Follow up. No problems     History of Present Illness  Kelly Wiley is a 67 y.o. female with a history of seizures, HLD, HTN, GERD, fibromyalgia, diabetes, IBS, and chronic nausea and dysphagia presenting today for follow-up.  OV 08/17/22.  Reported Reglan caused her to have body pain and felt better after she stopped.  Nausea was good up until day before her office visit.  Realize she is not able to eat larger meals.  Taking Dramamine daily to help with nausea that occurs from her pain medication.  States her nausea makes her memory and brain fog worse.  Continues to watch the size of her bites of food given her dysphagia.  Reported 2 weeks flare of IBS with diarrhea.  Will have pain and eventually go to the bathroom and goes a lot.  Denied frequent bowel movements.  Her normal without IBS flare is 2-4 stools daily.  Noted to have more frequent UTIs recently.  Reflux well-controlled but with constant fullness sensation and gassiness.  Had recently started [place] women's probiotic but had only been taking for 2 days.  Advised to continue PPI twice daily, Dramamine as needed and Zofran as needed.  Continue smaller more frequent meals and to use Gas-X, Beano, or Phazyme as needed.  Continue daily probiotic advised that we could consider Xifaxan in the future and also consider fecal elastase and TSH if ongoing diarrhea.   Last office visit 10/18/2022.  Continue to experience bloating and brief worsening of diarrhea.  Overall nausea was fairly well-controlled with Dramamine.  Recommendations: Xifaxan 550 mg 3 times daily for 2 weeks.  Fecal elastase and celiac labs in the future if Xifaxan not  helpful.  Request labs from PCP.  Low FODMAP diet.  Avoid lactose.  Continue Dramamine and Levsin.  Continue Gas-X, add famotidine for severe symptoms.  Gastroparesis diet.  PPI BID.   Today: IBS-diarrhea: has only had about 2-3 bouts of diarrhea since last visit. Currently not taking imodium or gas ex or Levsin. Has not taken levsin for about a week. Still continue the keiffer and yogurt almost daily, at least 3 times per week. Has increased her fruit intake as well.   GERD: not having much issues with reflux. Still has fullness feeling but not actual indigestion. Still tacking pantoprazole twice daily.   Nausea: Has been better even without nausea medication. Takes the dramamine with the oxycodone. No identified trigger for it. For sure if she eats more meat that upsets her stomach more often. Does not eat much meat.   Has had issues with kidney stones and UTIs within the last 6 months. Still having some issues. Currently on preventive keflex.    Wt Readings from Last 3 Encounters:  02/13/23 182 lb 6.4 oz (82.7 kg)  01/01/23 185 lb (83.9 kg)  11/24/22 185 lb (83.9 kg)   Current Outpatient Medications  Medication Sig Dispense Refill   carvedilol (COREG) 12.5 MG tablet TAKE (1) TABLET TWICE DAILY. 60 tablet 9   cloNIDine (CATAPRES) 0.1 MG tablet Take 0.1 mg by mouth 2 (two) times daily.     estradiol (ESTRACE) 0.1 MG/GM vaginal cream Place vaginally.  HUMALOG KWIKPEN 100 UNIT/ML KwikPen 30 Units 2 (two) times daily before a meal.     HYDROcodone-acetaminophen (NORCO) 10-325 MG tablet Take 1 tablet by mouth every 6 (six) hours as needed. 30 tablet 0   LANTUS SOLOSTAR 100 UNIT/ML Solostar Pen SMARTSIG:60 Unit(s) SUB-Q Daily     methocarbamol (ROBAXIN) 500 MG tablet Take 500 mg by mouth daily.     NONFORMULARY OR COMPOUNDED ITEM [place] supplement: 36 mg of soluble Proanthocyanidin (PAC). Take 1 capsule by mouth once daily in the morning to prevent recurrent UTI.  Can be purchased via  online vendors or obtained at a discount by sending this prescription to: [facility] Fax #: [phone] Address: [address] [place], [place], [place] [address] NCPD# [id number] 30 each 11   pantoprazole (PROTONIX) 40 MG tablet Take 40 mg by mouth 2 (two) times daily.     Simethicone (GAS-X PO) Take by mouth.     tiZANidine (ZANAFLEX) 4 MG tablet Take 4 mg by mouth at bedtime.     Vitamin D, Ergocalciferol, (DRISDOL) 1.25 MG (50000 UNIT) CAPS capsule Take 50,000 Units by mouth 2 (two) times a week.     dimenhyDRINATE (MOTION SICKNESS RELIEF) 50 MG tablet Take 50 mg by mouth in the morning and at bedtime. (Patient not taking: Reported on 02/13/2023)     ezetimibe (ZETIA) 10 MG tablet Take 10 mg by mouth daily. (Patient not taking: Reported on 02/13/2023)     hyoscyamine (LEVSIN) 0.125 MG tablet Take 0.125 mg by mouth every 4 (four) hours as needed for cramping. (Patient not taking: Reported on 02/13/2023)     No current facility-administered medications for this visit.    Past Medical History:  Diagnosis Date   Cervical spine disease    DM (diabetes mellitus) (HCC)    Dysphagia    Fibromyalgia    GERD (gastroesophageal reflux disease)    HTN (hypertension)    Hypercholesteremia    PONV (postoperative nausea and vomiting)    Seasonal allergies    Seizures (HCC)    last one > 20 years ago    Past Surgical History:  Procedure Laterality Date   BACK SURGERY     BALLOON DILATION N/A 09/19/2021   Procedure: BALLOON DILATION;  Surgeon: Lanelle Bal, DO;  [facility];  Service: Endoscopy;  Laterality: N/A;   BIOPSY  09/19/2021   Procedure: BIOPSY;  Surgeon: Lanelle Bal, DO;  [facility];  Service: Endoscopy;;  gastric;random colon;   CARPAL TUNNEL RELEASE  RIGHT   CERVICAL SPINE SURGERY  06/19/2010   COLONOSCOPY  [facility]    FLEISCHMAN NL   COLONOSCOPY WITH PROPOFOL N/A 09/19/2021   Procedure: COLONOSCOPY WITH PROPOFOL;  Surgeon: Lanelle Bal, DO;  [facility]: [facility];  Service: Endoscopy;  Laterality: N/A;  12:00pm   ESOPHAGOGASTRODUODENOSCOPY  01/24/2011   JOA:[id number] 2o TO CERVICAL PLATES IN PT'S NECK AND MILD ESO MOTILITY DISORDER/Mild gastritis   ESOPHAGOGASTRODUODENOSCOPY  03/03/2011   SWF:UXNA gastritis    ESOPHAGOGASTRODUODENOSCOPY (EGD) WITH PROPOFOL N/A 09/19/2021   Procedure: ESOPHAGOGASTRODUODENOSCOPY (EGD) WITH PROPOFOL;  Surgeon: Lanelle Bal, DO;  [facility];  Service: Endoscopy;  Laterality: N/A;   EXTRACORPOREAL SHOCK WAVE LITHOTRIPSY Right 11/28/2022   Procedure: EXTRACORPOREAL SHOCK WAVE LITHOTRIPSY (ESWL);  Surgeon: Malen Gauze, MD;  Location: [facility];  Service: Urology;  Laterality: Right;   FLEXIBLE SIGMOIDOSCOPY  01/24/2011   SLF: Internal Hemorrhoids   SAVORY DILATION  01/24/2011   Procedure: SAVORY DILATION;  Surgeon: Arlyce Harman, MD;  [facility];  Service: Endoscopy;  Laterality: N/A;   SHOULDER SURGERY  RIGHT   TUBAL LIGATION      Family History  Problem Relation Age of Onset   Lung cancer Mother    Heart disease Mother        Mitral valve disease   Congenital heart disease Son        has a PPM   Diabetes Son    Colon cancer Neg Hx    Colon polyps Neg Hx     Allergies as of 02/13/2023 - Review Complete 02/13/2023  Allergen Reaction Noted   Ativan [lorazepam]  07/05/2015   Iodine 131 Other (See Comments) 07/21/2010   Phenytoin sodium extended Other (See Comments) 07/21/2010   Sulfa antibiotics Other (See Comments) 07/21/2010   Zarontin [ethosuximide]  09/09/2021    Social History   Socioeconomic History   Marital status: Widowed    Spouse name: Not on file   Number of children: Not on file   Years of education: Not on file   Highest education level: Not on file  Occupational History   Not on file  Tobacco Use   Smoking status: Former    Current packs/day: 0.00    Average packs/day: 0.5 packs/day for 2.0 years (1.0 ttl pk-yrs)     Types: Cigarettes    Start date: 10/12/1966    Quit date: 10/11/1968    Years since quitting: 54.3   Smokeless tobacco: Never  Vaping Use   Vaping status: Never Used  Substance and Sexual Activity   Alcohol use: Yes    Alcohol/week: 0.0 standard drinks of alcohol    Comment: occasionally   Drug use: No   Sexual activity: Not Currently  Other Topics Concern   Not on file  Social History Narrative   MARRIED, 2 KIDS, YOUNGEST 17 YO.   Son has DM, malabsorption   Works in Audiological scientist in [place]   EtOH: rare   No tobacco products   Social Determinants of Corporate investment banker Strain: Not on file  Food Insecurity: No Food Insecurity (11/11/2021)   Received from [facility], [facility]   Hunger Vital Sign    Worried About Running Out of Food in the Last Year: Never true    Ran Out of Food in the Last Year: Never true  Transportation Needs: No Transportation Needs (01/19/2022)   Received from [facility], [facility]   PRAPARE - Transportation    Lack of Transportation (Medical): No    Lack of Transportation (Non-Medical): No  Physical Activity: Not on file  Stress: No Stress Concern Present (11/02/2020)   Received from [facility], [facility] - Occupational Stress Questionnaire    Feeling of Stress : Not at all  Social Connections: Unknown (07/28/2021)   Received from [facility], [facility]   Social Network    Social Network: Not on file   Review of Systems   Gen: Denies fever, chills, anorexia. Denies fatigue, weakness, weight loss.  CV: Denies chest pain, palpitations, syncope, peripheral edema, and claudication. Resp: Denies dyspnea at rest, cough, wheezing, coughing up blood, and pleurisy. GI: See HPI Derm: Denies rash, itching, dry skin Psych: Denies depression, anxiety, memory loss, confusion. No homicidal or suicidal ideation.  Heme: Denies bruising, bleeding, and enlarged lymph  nodes.  Physical Exam   BP (!) 146/83 (BP Location:  Right Arm, Patient Position: Sitting, Cuff Size: Normal)   Pulse 67   Temp 98.2 F (36.8 C) (Temporal)   Ht 5\' 2"  (1.575 m)   Wt 182 lb 6.4 oz (82.7 kg)   BMI 33.36 kg/m   General:   Alert and oriented. No distress noted. Pleasant and cooperative.  Head:  Normocephalic and atraumatic. Eyes:  Conjuctiva clear without scleral icterus. Mouth:  Oral mucosa pink and moist. Good dentition. No lesions. Lungs:  Clear to auscultation bilaterally. No wheezes, rales, or rhonchi. No distress.  Heart:  S1, S2 present without murmurs appreciated.  Abdomen:  +BS, soft, non-tender and non-distended. No rebound or guarding. No HSM or masses noted. Rectal: deferred Msk:  Symmetrical without gross deformities. Normal posture. Extremities:  Without edema. Neurologic:  Alert and  oriented x4 Psych:  Alert and cooperative. Normal mood and affect.  Assessment  Kelly Wiley is a 67 y.o. female with a history of seizures, HLD, HTN, GERD, fibromyalgia, diabetes, IBS, and chronic nausea and dysphagia presenting today for follow-up.  IBS-diarrhea, bloating: Usually at baseline has 3-5 bowel movements per day, recently has not had any flares.  She reports about 2-3 episodes since her last visit.  Overall previously was having significant amount of bloating as well with associated abdominal pain.  With previously taking an antibiotic as well as Gas-X.  Since her last visit she has continued to drink Keefer and eat yogurt.  I suspected previously that these could be contributing to her bloating however she has continued.  Recommended trial of Xifaxan but patient reports she was unable to afford the $100 co-pay.  Reassuringly she has had an improvement of symptoms recently.  Has not had testing for pancreatic insufficiency or alpha gal in regards to her nausea as well as her diarrhea.  She has had previous negative celiac panel.  We discussed testing for alpha gal  and pancreatic insufficiency if she would like given her chronic nature of symptoms and she would like to wait until after the first of the year.  For now advised her to continue avoiding lactose is much as possible and that she can resume Gas-X and Levsin as needed.  We discussed the cyclical nature of this with periods of quiescent disease along with flares.  Nausea, early satiety: Suspected to be secondary to diabetic gastroparesis.  Failed Reglan and erythromycin in the past but Dramamine continues to work well for her.  She continues to take this with her pain medications and overall feels as though symptoms are fairly well-controlled currently.  We have previously discussed repeating GES given her prior treatment failures in the past but likely would not change management therefore we have deferred.  If symptoms were to return or worsen we could consider this.  GERD: Currently well-controlled with pantoprazole 40 mg twice daily.  PLAN   Gastroparesis diet Pantoprazole 40 mg twice daily.  Fecal elastase, alpha gal - can wait until after first of the year per patient request.  Avoid lactose Continue Dramamine as needed Continue Levsin as needed Resume Gas-X if needed Follow-up in 4 months.    Brooke Bonito, MSN, FNP-BC, AGACNP-BC [facility]

## 2023-02-20 ENCOUNTER — Other Ambulatory Visit: Payer: Self-pay | Admitting: Gastroenterology

## 2023-02-20 ENCOUNTER — Other Ambulatory Visit: Payer: Self-pay | Admitting: *Deleted

## 2023-02-20 DIAGNOSIS — K580 Irritable bowel syndrome with diarrhea: Secondary | ICD-10-CM

## 2023-02-20 NOTE — Telephone Encounter (Signed)
Labs entered into Epic. Will mail closer to time to have labs done.

## 2023-02-26 DIAGNOSIS — M810 Age-related osteoporosis without current pathological fracture: Secondary | ICD-10-CM | POA: Diagnosis not present

## 2023-03-07 DIAGNOSIS — R109 Unspecified abdominal pain: Secondary | ICD-10-CM | POA: Diagnosis not present

## 2023-03-07 DIAGNOSIS — R300 Dysuria: Secondary | ICD-10-CM | POA: Diagnosis not present

## 2023-03-07 DIAGNOSIS — R5383 Other fatigue: Secondary | ICD-10-CM | POA: Diagnosis not present

## 2023-03-07 DIAGNOSIS — I10 Essential (primary) hypertension: Secondary | ICD-10-CM | POA: Diagnosis not present

## 2023-03-07 DIAGNOSIS — Z6833 Body mass index (BMI) 33.0-33.9, adult: Secondary | ICD-10-CM | POA: Diagnosis not present

## 2023-03-19 DIAGNOSIS — Z6832 Body mass index (BMI) 32.0-32.9, adult: Secondary | ICD-10-CM | POA: Diagnosis not present

## 2023-03-19 DIAGNOSIS — I10 Essential (primary) hypertension: Secondary | ICD-10-CM | POA: Diagnosis not present

## 2023-03-19 DIAGNOSIS — M545 Low back pain, unspecified: Secondary | ICD-10-CM | POA: Diagnosis not present

## 2023-03-20 ENCOUNTER — Other Ambulatory Visit: Payer: Self-pay | Admitting: *Deleted

## 2023-03-20 DIAGNOSIS — K580 Irritable bowel syndrome with diarrhea: Secondary | ICD-10-CM

## 2023-03-23 DIAGNOSIS — M545 Low back pain, unspecified: Secondary | ICD-10-CM | POA: Diagnosis not present

## 2023-03-23 DIAGNOSIS — R5383 Other fatigue: Secondary | ICD-10-CM | POA: Diagnosis not present

## 2023-03-23 DIAGNOSIS — I10 Essential (primary) hypertension: Secondary | ICD-10-CM | POA: Diagnosis not present

## 2023-03-23 DIAGNOSIS — Z6832 Body mass index (BMI) 32.0-32.9, adult: Secondary | ICD-10-CM | POA: Diagnosis not present

## 2023-03-29 DIAGNOSIS — R110 Nausea: Secondary | ICD-10-CM | POA: Diagnosis not present

## 2023-03-29 DIAGNOSIS — M797 Fibromyalgia: Secondary | ICD-10-CM | POA: Diagnosis not present

## 2023-03-29 DIAGNOSIS — E1143 Type 2 diabetes mellitus with diabetic autonomic (poly)neuropathy: Secondary | ICD-10-CM | POA: Diagnosis not present

## 2023-03-29 DIAGNOSIS — E039 Hypothyroidism, unspecified: Secondary | ICD-10-CM | POA: Diagnosis not present

## 2023-03-29 DIAGNOSIS — E7849 Other hyperlipidemia: Secondary | ICD-10-CM | POA: Diagnosis not present

## 2023-03-29 DIAGNOSIS — E782 Mixed hyperlipidemia: Secondary | ICD-10-CM | POA: Diagnosis not present

## 2023-03-29 DIAGNOSIS — Z1321 Encounter for screening for nutritional disorder: Secondary | ICD-10-CM | POA: Diagnosis not present

## 2023-03-29 DIAGNOSIS — I10 Essential (primary) hypertension: Secondary | ICD-10-CM | POA: Diagnosis not present

## 2023-03-29 LAB — VITAMIN D 25 HYDROXY (VIT D DEFICIENCY, FRACTURES): Vit D, 25-Hydroxy: 51.9

## 2023-03-29 LAB — BASIC METABOLIC PANEL WITH GFR
BUN: 26 — AB (ref 4–21)
Creatinine: 1 (ref 0.5–1.1)
Glucose: 237

## 2023-03-29 LAB — LIPID PANEL: Triglycerides: 511 — AB (ref 40–160)

## 2023-03-29 LAB — HEMOGLOBIN A1C: Hemoglobin A1C: 7.8

## 2023-03-29 LAB — COMPREHENSIVE METABOLIC PANEL WITH GFR: eGFR: 64

## 2023-03-29 LAB — TSH: TSH: 3.94 (ref 0.41–5.90)

## 2023-03-29 NOTE — Progress Notes (Signed)
 Name: Kelly Wiley DOB: Oct 03, 1955 MRN: [id number]  History of Present Illness: Kelly Wiley is a 68 y.o. female who presents today for follow up visit at [facility] [place]. - GU History: 1. Recurrent UTls. - Her typical UTI symptoms include low back pain, bladder pain, increased urinary urgency, frequency. Does not typically experience dysuria with UTI.  - Past history of pyelonephritis in childhood. - Prior episode of urosepsis in 2023 (in [place] ). - Previously followed by Dr. Alvia at [facility]. Cystoscopy was unremarkable in March 2018 and on 05/18/2022.  - Risk factors include T2DM, vaginal atrophy, occasional fecal incontinence.  - Previously took Nitrofurantoin daily and Ellura supplement for UTI prophylaxis.  - Currently taking Keflex  250 mg daily for UTI prevention. 2. Vaginal atrophy. - Using topical vaginal estrogen cream 2x/week. 3. Kidney stones.  - 11/28/2022: Underwent left ESWL by Dr. Sherrilee for management of a left intrarenal stone, which was thought to be a potential infectious nidus in this patient which history of recurrent UTIs. 4. Mixed urinary incontinence; stress predominant.  - Not significantly bothersome per patient.   At last visit on 01/01/2023: - Reported right flank pain. Asymptomatic for UTI. - UA: >30 WBC/hpf, 11-30 RBC/hpf, bacteria (many)  - Advised the following: 1. Urine culture. 2. RUS & KUB. 3. Continue Keflex  250 mg daily for UTI prevention. 4. Continue topical vaginal estrogen cream use 2x/week. 5. Return in about 3 months (around 04/03/2023) for UA, PVR, & f/u with [name] NP.  Since last visit: > 01/10/2023: CT stone showed small bilateral kidney stones with no hydronephrosis; no ureteral stones. Nothing to suggest urologic etiology for her right flank pain.   Today: She reports 1 UTI since last visit in late December 2024 which was managed by her PCP. Asymptomatic for UTI today.  She  denies recent stone passage. She reports intermittent bilateral flank / low back pain; denies abdominal pain. She denies fevers, nausea, or vomiting.  She denies increased urinary urgency, nocturia, dysuria, gross hematuria, hesitancy, straining to void, or sensations of incomplete emptying. Continues to deny significant bother from her MUI. Reports some urinary frequency which she correlates with her blood pressure medications.    Fall Screening: Do you usually have a device to assist in your mobility? No   Medications: Current Outpatient Medications  Medication Sig Dispense Refill   alfuzosin  (UROXATRAL ) 10 MG 24 hr tablet Take 1 tablet (10 mg total) by mouth daily as needed. For stone symptoms. 30 tablet 0   calcium carbonate (TUMS - DOSED IN MG ELEMENTAL CALCIUM) 500 MG chewable tablet Chew 1 tablet by mouth 2 (two) times daily.     carvedilol  (COREG ) 12.5 MG tablet TAKE (1) TABLET TWICE DAILY. 60 tablet 9   cephALEXin  (KEFLEX ) 250 MG capsule Take 1 capsule (250 mg total) by mouth daily. 30 capsule 11   diclofenac (VOLTAREN) 75 MG EC tablet Take 75 mg by mouth 2 (two) times daily.     dimenhyDRINATE (MOTION SICKNESS RELIEF) 50 MG tablet Take 50 mg by mouth in the morning and at bedtime.     estradiol  (ESTRACE ) 0.1 MG/GM vaginal cream Discard plastic applicator. Insert a blueberry size amount (approximately 1 gram) of cream on fingertip inside vagina at bedtime two nights per week. 30 g 3   ezetimibe  (ZETIA ) 10 MG tablet Take 10 mg by mouth daily.     HUMALOG KWIKPEN 100 UNIT/ML KwikPen 30 Units 2 (two) times daily before a meal.  HYDROcodone -acetaminophen  (NORCO) 10-325 MG tablet Take 1 tablet by mouth every 6 (six) hours as needed. 30 tablet 0   hyoscyamine (LEVSIN) 0.125 MG tablet Take 0.125 mg by mouth every 4 (four) hours as needed for cramping.     LANTUS SOLOSTAR 100 UNIT/ML Solostar Pen SMARTSIG:60 Unit(s) SUB-Q Daily     methocarbamol (ROBAXIN) 500 MG tablet Take 500 mg by  mouth daily.     pantoprazole  (PROTONIX ) 40 MG tablet Take 40 mg by mouth 2 (two) times daily.     Simethicone  (GAS-X PO) Take by mouth.     tiZANidine (ZANAFLEX) 4 MG tablet Take 4 mg by mouth at bedtime.     Vitamin D , Ergocalciferol , (DRISDOL) 1.25 MG (50000 UNIT) CAPS capsule Take 50,000 Units by mouth 2 (two) times a week.     cloNIDine (CATAPRES) 0.1 MG tablet Take 0.1 mg by mouth 2 (two) times daily. (Patient not taking: Reported on 04/03/2023)     NONFORMULARY OR COMPOUNDED ITEM Ellura supplement: 36 mg of soluble Proanthocyanidin (PAC). Take 1 capsule by mouth once daily in the morning to prevent recurrent UTI.  Can be purchased via online vendors or obtained at a discount by sending this prescription to: [facility] Fax #: [phone] Address: [address] [place], [place], [place] [address] NCPD# [id number] (Patient not taking: Reported on 04/03/2023) 30 each 11   No current facility-administered medications for this visit.    Allergies: Allergies  Allergen Reactions   Ativan [Lorazepam]     Lowered blood pressure   Iodine 131 Other (See Comments)    Blacked out  Dye*    Phenytoin Sodium Extended Other (See Comments)    Killed white blood cell at age of 89  Dilantin    Sulfa Antibiotics Other (See Comments)    Bones    Zarontin [Ethosuximide]     Pt does not remember reaction     Past Medical History:  Diagnosis Date   Cervical spine disease    DM (diabetes mellitus) (HCC)    Dysphagia    Fibromyalgia    GERD (gastroesophageal reflux disease)    HTN (hypertension)    Hypercholesteremia    PONV (postoperative nausea and vomiting)    Seasonal allergies    Seizures (HCC)    last one > 20 years ago   Past Surgical History:  Procedure Laterality Date   BACK SURGERY     BALLOON DILATION N/A 09/19/2021   Procedure: BALLOON DILATION;  Surgeon: Cindie Carlin POUR, DO;  [facility];  Service: Endoscopy;  Laterality: N/A;   BIOPSY  09/19/2021    Procedure: BIOPSY;  Surgeon: Cindie Carlin POUR, DO;  [facility]: [facility];  Service: Endoscopy;;  gastric;random colon;   CARPAL TUNNEL RELEASE  RIGHT   CERVICAL SPINE SURGERY  06/19/2010   COLONOSCOPY  2008 [facility]    [place]   COLONOSCOPY WITH PROPOFOL  N/A 09/19/2021   Procedure: COLONOSCOPY WITH PROPOFOL ;  Surgeon: Cindie Carlin POUR, DO;  [facility];  Service: Endoscopy;  Laterality: N/A;  12:00pm   ESOPHAGOGASTRODUODENOSCOPY  01/24/2011   DOQ:IBDEYJHPJ 2o TO CERVICAL PLATES IN PT'S NECK AND MILD ESO MOTILITY DISORDER/Mild gastritis   ESOPHAGOGASTRODUODENOSCOPY  03/03/2011   DOQ:fpoi gastritis    ESOPHAGOGASTRODUODENOSCOPY (EGD) WITH PROPOFOL  N/A 09/19/2021   Procedure: ESOPHAGOGASTRODUODENOSCOPY (EGD) WITH PROPOFOL ;  Surgeon: Cindie Carlin POUR, DO;  [facility];  Service: Endoscopy;  Laterality: N/A;   EXTRACORPOREAL SHOCK WAVE LITHOTRIPSY Right 11/28/2022  Procedure: EXTRACORPOREAL SHOCK WAVE LITHOTRIPSY (ESWL);  Surgeon: Sherrilee Belvie CROME, MD;  Location: [facility];  Service: Urology;  Laterality: Right;   FLEXIBLE SIGMOIDOSCOPY  01/24/2011   SLF: Internal Hemorrhoids   SAVORY DILATION  01/24/2011   Procedure: SAVORY DILATION;  Surgeon: Margo CHRISTELLA Haddock, MD;  [facility];  Service: Endoscopy;  Laterality: N/A;   SHOULDER SURGERY  RIGHT   TUBAL LIGATION     Family History  Problem Relation Age of Onset   Lung cancer Mother    Heart disease Mother        Mitral valve disease   Congenital heart disease Son        has a PPM   Diabetes Son    Colon cancer Neg Hx    Colon polyps Neg Hx    Social History   Socioeconomic History   Marital status: Widowed    Spouse name: Not on file   Number of children: Not on file   Years of education: Not on file   Highest education level: Not on file  Occupational History   Not on file  Tobacco Use   Smoking status: Former    Current packs/day: 0.00    Average packs/day: 0.5 packs/day for 2.0 years (1.0  ttl pk-yrs)    Types: Cigarettes    Start date: 10/12/1966    Quit date: 10/11/1968    Years since quitting: 54.5   Smokeless tobacco: Never  Vaping Use   Vaping status: Never Used  Substance and Sexual Activity   Alcohol  use: Yes    Alcohol /week: 0.0 standard drinks of alcohol     Comment: occasionally   Drug use: No   Sexual activity: Not Currently  Other Topics Concern   Not on file  Social History Narrative   MARRIED, 2 KIDS, YOUNGEST 31 YO.   Son has DM, malabsorption   Works in audiological scientist in [place]   EtOH: rare   No tobacco products   Social Drivers of Corporate Investment Banker Strain: Not on file  Food Insecurity: No Food Insecurity (11/11/2021)   Received from [facility], [facility]   Hunger Vital Sign    Worried About Running Out of Food in the Last Year: Never true    Ran Out of Food in the Last Year: Never true  Transportation Needs: No Transportation Needs (01/19/2022)   Received from [facility], [facility]   PRAPARE - Transportation    Lack of Transportation (Medical): No    Lack of Transportation (Non-Medical): No  Physical Activity: Not on file  Stress: No Stress Concern Present (11/02/2020)   Received from [facility], [facility] - Occupational Stress Questionnaire    Feeling of Stress : Not at all  Social Connections: Unknown (07/28/2021)   Received from [facility], [facility]   Social Network    Social Network: Not on file  Intimate Partner Violence: Unknown (06/23/2021)   Received from [facility], [facility]   HITS    Physically Hurt: Not on file    Insult or Talk Down To: Not on file    Threaten Physical Harm: Not on file    Scream or Curse: Not on file    SUBJECTIVE  Review of Systems Constitutional: Patient denies any unintentional weight loss or change in strength lntegumentary: Patient denies any rashes or pruritus Cardiovascular: Patient denies chest pain or  syncope Respiratory: Patient denies shortness of breath Gastrointestinal: Patient denies  vomiting, constipation, or diarrhea. Reports chronic nausea.  Musculoskeletal: Patient denies muscle cramps or weakness Neurologic: Patient denies convulsions or seizures Allergic/Immunologic: Patient denies recent allergic reaction(s) Hematologic/Lymphatic: Patient denies bleeding tendencies Endocrine: Patient denies heat/cold intolerance  GU: As per HPI.  OBJECTIVE Vitals:   04/03/23 1330 04/03/23 1412  BP: (!) 189/119 (!) 168/100  Pulse: 76 75  Temp: 98.2 F (36.8 C)    There is no height or weight on file to calculate BMI.  Physical Examination Constitutional: No obvious distress; patient is non-toxic appearing  Cardiovascular: No visible lower extremity edema.  Respiratory: The patient does not have audible wheezing/stridor; respirations do not appear labored  Gastrointestinal: Abdomen non-distended Musculoskeletal: Normal ROM of UEs  Skin: No obvious rashes/open sores  Neurologic: CN 2-12 grossly intact Psychiatric: Answered questions appropriately with normal affect  Hematologic/Lymphatic/Immunologic: No obvious bruises or sites of spontaneous bleeding  Urine microscopy: 6-10 WBC/hpf, 0-2 RBC/hpf, many bacteria PVR: 1 ml  ASSESSMENT Kidney stones - Plan: Urinalysis, Routine w reflex microscopic, BLADDER SCAN AMB NON-IMAGING, US  RENAL, DG Abd 1 View, alfuzosin  (UROXATRAL ) 10 MG 24 hr tablet  Recurrent UTI - Plan: Urinalysis, Routine w reflex microscopic, BLADDER SCAN AMB NON-IMAGING, estradiol  (ESTRACE ) 0.1 MG/GM vaginal cream, cephALEXin  (KEFLEX ) 250 MG capsule, US  RENAL  Atrophic vaginitis - Plan: Urinalysis, Routine w reflex microscopic, BLADDER SCAN AMB NON-IMAGING, estradiol  (ESTRACE ) 0.1 MG/GM vaginal cream  1. Recurrent UTls. Asymptomatic today so no urine culture. Will continue Keflex  250 mg daily for UTI prevention.  2. Vaginal atrophy. Advised to continue topical  vaginal estrogen cream use 2x/week.  3. Kidney stones.  - Low suspicion for stones as etiology for her intermittent bilateral flank / low back pain; likely musculoskeletal. We agreed that she can take Alfuzosin  10 mg daily PRN (along with OTC analgesics PRN) when that pain occurs to see if it helps and she was encouraged to contact Urology if at any point she wants updated imaging to assess for possible passing stone. Alfuzosin  selected as alternative to Flomax due to patient's sulfa allergy and based on preferred medication options per insurance coverage. - Advised adequate hydration and low oxalate diet for stone prevention given that calcium oxalate is the most common type of stone. Handout provided about stone prevention diet.  - Discussed option to do 24 hour urinalysis ([facility]) for metabolic stone evaluation, which may help with targeted recommendations for dietary I medication therapies for stone prevention. Patient elected to hold off on that.  4. Mixed urinary incontinence; stress predominant. Not significantly bothersome per patient.  Will plan to follow up in 6 months with KUB and RUS for stone surveillance or sooner if needed. Pt verbalized understanding and agreement. All questions were answered.  PLAN Advised the following: Maintain adequate fluid intake daily. Drink citrus juice (lemon, lime or orange juice) routinely. Low oxalate diet. Alfuzosin  10 mg daily PRN plus OTC analgesics PRN for stone symptoms. Continue Keflex  250 mg daily for UTI prevention. Continue topical vaginal estrogen cream 2x/week. Return in about 6 months (around 10/01/2023) for RUS, KUB, UA, PVR, & f/u with [name] NP.  Orders Placed This Encounter  Procedures   Microscopic Examination   US  RENAL    Standing Status:   Future    Expected Date:   10/01/2023    Expiration Date:   04/02/2024    Reason for Exam (SYMPTOM  OR DIAGNOSIS REQUIRED):   kidney stone known or suspected    Preferred imaging  location?:   [facility]  DG Abd 1 View    Standing Status:   Future    Expected Date:   10/01/2023    Expiration Date:   04/02/2024    Reason for Exam (SYMPTOM  OR DIAGNOSIS REQUIRED):   kidney stone    Preferred imaging location?:   [facility]   Urinalysis, Routine w reflex microscopic   BLADDER SCAN AMB NON-IMAGING    It has been explained that the patient is to follow regularly with their PCP in addition to all other providers involved in their care and to follow instructions provided by these respective offices. Patient advised to contact urology clinic if any urologic-pertaining questions, concerns, new symptoms or problems arise in the interim period.  Patient Instructions  >80% of stones are calcium oxalate. This type of stones forms when body either isn't clearing oxalate well enough, is making too much oxalate, or too little citrate. This results in oxalate binding to form crystals, which continue to aggregate and form stones.  Limiting calcium does not help, but limiting oxalate in the diet can help. Increasing citric acid intake may also help.  The following measures may help to prevent the recurrence of stones: Increase water  intake to 2-2.5 liters per day May add citrus juice (lemon, lime or orange juice) to water  Moderation in dairy foods Decrease in salt content 5. Low Oxalate diet: Oxylates are found in foods like Tomato, Spinach, red wine and chocolate (see additional resources below).  Internet resources for information regarding low oxalate diet: https://kidneystones.yangchunwu.com https://my.verticalstretch.be  Foods Low in Sodium or Oxalate Foods You Can Eat  Drinks Coffee, fruit and veggie juice (using the recommended veggies), fruit punch  Fruits Apples, apricots (fresh or canned), avocado, bananas, cherries (sweet), cranberries, grapefruit, red or green  grapes, lemon and lime juice, melons, nectarines, papayas, peaches, pears, pineapples, oranges, strawberries (fresh), tangerines  Veggies Artichokes, asparagus, bamboo shoots, broccoli, brussels sprouts, cabbage, cauliflower, chayote squash, chicory, corn, cucumbers, endive, lettuce, lima beans, mushrooms, onions, peas, peppers, potatoes, radishes, rutabagas, zucchini  Breads, Cereals, Grains Egg noodles, rye bread, cooked and dry cereals without nuts or bran, crackers with unsalted tops, white or wild rice  Meat, Meat Replacements, Fish, Recruitment Consultant, fish, poultry, eggs, egg whites, egg replacements  Soup Homemade soup (using the recommended veggies and meat), low-sodium bouillon, low-sodium canned  Desserts Cookies, cakes, ice cream, pudding without chocolate or nuts, candy without chocolate or nuts  Fats and Oils Butter, margarine, cream, oil, salad dressing, mayo  Other Foods Unsalted potato chips or pretzels, herbs (like garlic, garlic powder, onion powder), lemon juice, salt-free seasoning blends, vinegar  Other Foods Low in Oxalate Foods You Can Eat  Drinks Beer, cola, wine, buttermilk, lemonade or limeade (without added vitamin C), milk  Meat, Meat Replacements, Fish, [employer] meat, ham, bacon, hot dogs, bratwurst, sausage, chicken nuggets, cheddar cheese, canned fish and shellfish  Soup Tomato soup, cheese soup  Other Foods Coconuts, lemon or lime juices, sugar or sweeteners, jellies or jams (from the recommended list)   Moderate-Oxalate Foods Foods to Limit   Drinks Fruit and veggie juices (from the list below), chocolate milk, rice milk, hot cocoa, tea   Fruits Blackberries, blueberries, black currants, cherries (sour), fruit cocktail, mangoes, orange peel, prunes, purple plums   Veggies Baked beans, carrots, celery, green beans, parsnips, summer squash, tomatoes, turnips   Breads, Cereals, Grains White bread, cornbread or cornmeal, white English muffins, saltine or soda  crackers, brown rice, vanilla wafers, spaghetti and other noodles, firm tofu, bagels,  oatmeal   Meat/meat replacements, fish, poultry Sardines   Desserts Chocolate cake   Fats and Oils Macadamia nuts, pistachio nuts, English walnuts   Other Foods Jams or jellies (made with the fruits above), pepper    High-Oxalate Foods Foods to Avoid Drinks Chocolate drink mixes, soy milk, Ovaltine, instant iced tea, fruit juices of fruits listed below Fruits Apricots (dried), red currants, figs, kiwi, plums, rhubarb Veggies Beans (wax, dried), beets and beet greens, chives, collard greens, eggplant, escarole, dark greens of all kinds, leeks, okra, parsley, rutabagas, spinach, Swiss chard, tomato paste, watercress Breads, Cereals, Grains Amaranth, barley, white corn flour, fried potatoes, fruitcake, grits, soybean products, sweet potatoes, wheat germ and bran, buckwheat flour, All Bran cereal, graham crackers, pretzels, whole wheat bread Meat/meat replacements, fish, poultry  Dried beans, peanut butter, soy burgers, miso  Desserts Carob, chocolate, marmalades Fats and Oils Nuts (peanuts, almonds, pecans, cashews, hazelnuts), nut butters, sesame seeds, tahini paste Other Foods Poppy seeds   Electronically signed by:  Lauraine KYM Oz, MSN, FNP-C, CUNP 04/03/2023 3:37 PM

## 2023-04-03 ENCOUNTER — Encounter: Payer: Self-pay | Admitting: Urology

## 2023-04-03 ENCOUNTER — Ambulatory Visit (INDEPENDENT_AMBULATORY_CARE_PROVIDER_SITE_OTHER): Payer: Medicare Other | Admitting: Urology

## 2023-04-03 ENCOUNTER — Other Ambulatory Visit: Payer: Self-pay | Admitting: Urology

## 2023-04-03 VITALS — BP 168/100 | HR 75 | Temp 98.2°F

## 2023-04-03 DIAGNOSIS — N200 Calculus of kidney: Secondary | ICD-10-CM | POA: Diagnosis not present

## 2023-04-03 DIAGNOSIS — N390 Urinary tract infection, site not specified: Secondary | ICD-10-CM

## 2023-04-03 DIAGNOSIS — N952 Postmenopausal atrophic vaginitis: Secondary | ICD-10-CM | POA: Diagnosis not present

## 2023-04-03 DIAGNOSIS — Z87440 Personal history of urinary (tract) infections: Secondary | ICD-10-CM

## 2023-04-03 DIAGNOSIS — N3946 Mixed incontinence: Secondary | ICD-10-CM | POA: Diagnosis not present

## 2023-04-03 LAB — URINALYSIS, ROUTINE W REFLEX MICROSCOPIC
Bilirubin, UA: NEGATIVE
Glucose, UA: NEGATIVE
Ketones, UA: NEGATIVE
Nitrite, UA: POSITIVE — AB
RBC, UA: NEGATIVE
Specific Gravity, UA: 1.025 (ref 1.005–1.030)
Urobilinogen, Ur: 1 mg/dL (ref 0.2–1.0)
pH, UA: 6.5 (ref 5.0–7.5)

## 2023-04-03 LAB — BLADDER SCAN AMB NON-IMAGING: Scan Result: 1

## 2023-04-03 LAB — MICROSCOPIC EXAMINATION: Epithelial Cells (non renal): >10 /[HPF] — AB (ref 0–10)

## 2023-04-03 MED ORDER — CEPHALEXIN 250 MG PO CAPS
250.0000 mg | ORAL_CAPSULE | Freq: Every day | ORAL | 11 refills | Status: AC
Start: 2023-04-03 — End: ?

## 2023-04-03 MED ORDER — ALFUZOSIN HCL ER 10 MG PO TB24
10.0000 mg | ORAL_TABLET | Freq: Every day | ORAL | 0 refills | Status: DC | PRN
Start: 2023-04-03 — End: 2023-05-15

## 2023-04-03 MED ORDER — ESTRADIOL 0.1 MG/GM VA CREA
TOPICAL_CREAM | VAGINAL | 3 refills | Status: AC
Start: 2023-04-03 — End: ?

## 2023-04-03 NOTE — Telephone Encounter (Signed)
 Received labs from PCP with negative alpha gal panel.  I had also requested a pancreatic elastase (fecal elastase) lab as well.  Does not appear that this has been performed as of yet.  Please reach out to patient to see if she can complete this as part of her workup of diarrhea.  Charmaine Melia, MSN, APRN, FNP-BC, AGACNP-BC [facility] at [facility]

## 2023-04-03 NOTE — Patient Instructions (Signed)

## 2023-04-04 DIAGNOSIS — I5189 Other ill-defined heart diseases: Secondary | ICD-10-CM | POA: Diagnosis not present

## 2023-04-04 DIAGNOSIS — R0602 Shortness of breath: Secondary | ICD-10-CM | POA: Diagnosis not present

## 2023-04-04 NOTE — Telephone Encounter (Signed)
 LMOM for pt to call office

## 2023-04-05 ENCOUNTER — Telehealth: Payer: Self-pay

## 2023-04-05 ENCOUNTER — Other Ambulatory Visit: Payer: Self-pay | Admitting: Sports Medicine

## 2023-04-05 ENCOUNTER — Other Ambulatory Visit: Payer: Self-pay

## 2023-04-05 DIAGNOSIS — I10 Essential (primary) hypertension: Secondary | ICD-10-CM | POA: Diagnosis not present

## 2023-04-05 DIAGNOSIS — M5432 Sciatica, left side: Secondary | ICD-10-CM | POA: Diagnosis not present

## 2023-04-05 DIAGNOSIS — Z6833 Body mass index (BMI) 33.0-33.9, adult: Secondary | ICD-10-CM | POA: Diagnosis not present

## 2023-04-05 DIAGNOSIS — E119 Type 2 diabetes mellitus without complications: Secondary | ICD-10-CM | POA: Diagnosis not present

## 2023-04-05 DIAGNOSIS — E782 Mixed hyperlipidemia: Secondary | ICD-10-CM | POA: Diagnosis not present

## 2023-04-05 NOTE — Telephone Encounter (Signed)
Auth Submission: NO AUTH NEEDED Site of care: Site of care: [facility] Payer: medicare a/b, uhc supp Medication & CPT/J Code(s) submitted: reclast [id number] Route of submission (phone, fax, portal): portal Phone # Fax # Auth type: Buy/Bill HB Units/visits requested: 1 dose, 5mg  Reference number:  Approval from: 04/05/23 to 03/19/24

## 2023-04-12 ENCOUNTER — Encounter: Payer: Medicare Other | Attending: Sports Medicine | Admitting: Emergency Medicine

## 2023-04-12 VITALS — BP 139/72 | HR 80 | Temp 98.1°F | Resp 18

## 2023-04-12 DIAGNOSIS — Z87442 Personal history of urinary calculi: Secondary | ICD-10-CM | POA: Diagnosis not present

## 2023-04-12 DIAGNOSIS — R109 Unspecified abdominal pain: Secondary | ICD-10-CM | POA: Diagnosis not present

## 2023-04-12 DIAGNOSIS — Z882 Allergy status to sulfonamides status: Secondary | ICD-10-CM | POA: Diagnosis not present

## 2023-04-12 DIAGNOSIS — M810 Age-related osteoporosis without current pathological fracture: Secondary | ICD-10-CM | POA: Insufficient documentation

## 2023-04-12 DIAGNOSIS — R1030 Lower abdominal pain, unspecified: Secondary | ICD-10-CM | POA: Diagnosis not present

## 2023-04-12 DIAGNOSIS — K8020 Calculus of gallbladder without cholecystitis without obstruction: Secondary | ICD-10-CM | POA: Diagnosis not present

## 2023-04-12 DIAGNOSIS — N132 Hydronephrosis with renal and ureteral calculous obstruction: Secondary | ICD-10-CM | POA: Diagnosis not present

## 2023-04-12 DIAGNOSIS — I10 Essential (primary) hypertension: Secondary | ICD-10-CM | POA: Diagnosis not present

## 2023-04-12 DIAGNOSIS — I700 Atherosclerosis of aorta: Secondary | ICD-10-CM | POA: Diagnosis not present

## 2023-04-12 DIAGNOSIS — N200 Calculus of kidney: Secondary | ICD-10-CM | POA: Diagnosis not present

## 2023-04-12 DIAGNOSIS — E119 Type 2 diabetes mellitus without complications: Secondary | ICD-10-CM | POA: Diagnosis not present

## 2023-04-12 DIAGNOSIS — Z87891 Personal history of nicotine dependence: Secondary | ICD-10-CM | POA: Diagnosis not present

## 2023-04-12 MED ORDER — ACETAMINOPHEN 325 MG PO TABS: 650.0000 mg | ORAL_TABLET | Freq: Once | ORAL | Status: DC

## 2023-04-12 MED ORDER — DIPHENHYDRAMINE HCL 25 MG PO CAPS: 25.0000 mg | ORAL_CAPSULE | Freq: Once | ORAL | Status: DC

## 2023-04-12 MED ORDER — ZOLEDRONIC ACID 5 MG/100ML IV SOLN
5.0000 mg | Freq: Once | INTRAVENOUS | Status: AC
  Administered 2023-04-12: 5 mg via INTRAVENOUS

## 2023-04-12 NOTE — Progress Notes (Signed)
Diagnosis: Osteoporosis  Provider:  Albertha Ghee MD  Procedure: IV Infusion  IV Type: Peripheral, IV Location: R Antecubital  Reclast (Zolendronic Acid), Dose: 5 mg  Infusion Start Time: 1426  Infusion Stop Time: 1456  Post Infusion IV Care: Peripheral IV Discontinued  Discharge: Condition: Good, Destination: Home . AVS Provided  Performed by:  Arrie Senate, RN

## 2023-04-13 NOTE — Telephone Encounter (Signed)
Tried several times to reach pt. Mailed lab requisitions so pt can have stool studies.

## 2023-04-23 DIAGNOSIS — M791 Myalgia, unspecified site: Secondary | ICD-10-CM | POA: Diagnosis not present

## 2023-04-23 DIAGNOSIS — M542 Cervicalgia: Secondary | ICD-10-CM | POA: Diagnosis not present

## 2023-04-23 DIAGNOSIS — M25511 Pain in right shoulder: Secondary | ICD-10-CM | POA: Diagnosis not present

## 2023-04-24 DIAGNOSIS — M2550 Pain in unspecified joint: Secondary | ICD-10-CM | POA: Diagnosis not present

## 2023-04-24 DIAGNOSIS — M961 Postlaminectomy syndrome, not elsewhere classified: Secondary | ICD-10-CM | POA: Diagnosis not present

## 2023-04-24 DIAGNOSIS — M4726 Other spondylosis with radiculopathy, lumbar region: Secondary | ICD-10-CM | POA: Diagnosis not present

## 2023-04-24 DIAGNOSIS — M47816 Spondylosis without myelopathy or radiculopathy, lumbar region: Secondary | ICD-10-CM | POA: Diagnosis not present

## 2023-04-27 DIAGNOSIS — M170 Bilateral primary osteoarthritis of knee: Secondary | ICD-10-CM | POA: Diagnosis not present

## 2023-04-30 DIAGNOSIS — M5412 Radiculopathy, cervical region: Secondary | ICD-10-CM | POA: Diagnosis not present

## 2023-04-30 DIAGNOSIS — M7541 Impingement syndrome of right shoulder: Secondary | ICD-10-CM | POA: Diagnosis not present

## 2023-05-02 DIAGNOSIS — M5412 Radiculopathy, cervical region: Secondary | ICD-10-CM | POA: Diagnosis not present

## 2023-05-02 DIAGNOSIS — M7541 Impingement syndrome of right shoulder: Secondary | ICD-10-CM | POA: Diagnosis not present

## 2023-05-04 DIAGNOSIS — M170 Bilateral primary osteoarthritis of knee: Secondary | ICD-10-CM | POA: Diagnosis not present

## 2023-05-11 ENCOUNTER — Other Ambulatory Visit: Payer: Self-pay | Admitting: Urology

## 2023-05-11 DIAGNOSIS — N200 Calculus of kidney: Secondary | ICD-10-CM

## 2023-05-16 DIAGNOSIS — R300 Dysuria: Secondary | ICD-10-CM | POA: Diagnosis not present

## 2023-05-16 DIAGNOSIS — Z6832 Body mass index (BMI) 32.0-32.9, adult: Secondary | ICD-10-CM | POA: Diagnosis not present

## 2023-05-16 DIAGNOSIS — M5432 Sciatica, left side: Secondary | ICD-10-CM | POA: Diagnosis not present

## 2023-05-16 DIAGNOSIS — I10 Essential (primary) hypertension: Secondary | ICD-10-CM | POA: Diagnosis not present

## 2023-05-18 DIAGNOSIS — M170 Bilateral primary osteoarthritis of knee: Secondary | ICD-10-CM | POA: Diagnosis not present

## 2023-05-22 DIAGNOSIS — M5412 Radiculopathy, cervical region: Secondary | ICD-10-CM | POA: Diagnosis not present

## 2023-05-22 DIAGNOSIS — M7541 Impingement syndrome of right shoulder: Secondary | ICD-10-CM | POA: Diagnosis not present

## 2023-05-24 DIAGNOSIS — M7541 Impingement syndrome of right shoulder: Secondary | ICD-10-CM | POA: Diagnosis not present

## 2023-05-24 DIAGNOSIS — M5412 Radiculopathy, cervical region: Secondary | ICD-10-CM | POA: Diagnosis not present

## 2023-05-28 DIAGNOSIS — M5412 Radiculopathy, cervical region: Secondary | ICD-10-CM | POA: Diagnosis not present

## 2023-05-28 DIAGNOSIS — M7541 Impingement syndrome of right shoulder: Secondary | ICD-10-CM | POA: Diagnosis not present

## 2023-05-30 DIAGNOSIS — M5412 Radiculopathy, cervical region: Secondary | ICD-10-CM | POA: Diagnosis not present

## 2023-05-30 DIAGNOSIS — M7541 Impingement syndrome of right shoulder: Secondary | ICD-10-CM | POA: Diagnosis not present

## 2023-06-06 DIAGNOSIS — M4726 Other spondylosis with radiculopathy, lumbar region: Secondary | ICD-10-CM | POA: Diagnosis not present

## 2023-06-06 DIAGNOSIS — M961 Postlaminectomy syndrome, not elsewhere classified: Secondary | ICD-10-CM | POA: Diagnosis not present

## 2023-06-06 DIAGNOSIS — M5116 Intervertebral disc disorders with radiculopathy, lumbar region: Secondary | ICD-10-CM | POA: Diagnosis not present

## 2023-06-11 DIAGNOSIS — M47816 Spondylosis without myelopathy or radiculopathy, lumbar region: Secondary | ICD-10-CM | POA: Diagnosis not present

## 2023-06-11 DIAGNOSIS — E119 Type 2 diabetes mellitus without complications: Secondary | ICD-10-CM | POA: Diagnosis not present

## 2023-06-11 DIAGNOSIS — I10 Essential (primary) hypertension: Secondary | ICD-10-CM | POA: Diagnosis not present

## 2023-06-11 DIAGNOSIS — K219 Gastro-esophageal reflux disease without esophagitis: Secondary | ICD-10-CM | POA: Diagnosis not present

## 2023-06-12 ENCOUNTER — Ambulatory Visit: Payer: Medicare Other | Admitting: Gastroenterology

## 2023-06-13 ENCOUNTER — Other Ambulatory Visit: Payer: Self-pay | Admitting: Urology

## 2023-06-13 DIAGNOSIS — N200 Calculus of kidney: Secondary | ICD-10-CM

## 2023-06-25 DIAGNOSIS — M810 Age-related osteoporosis without current pathological fracture: Secondary | ICD-10-CM | POA: Diagnosis not present

## 2023-06-25 DIAGNOSIS — M47816 Spondylosis without myelopathy or radiculopathy, lumbar region: Secondary | ICD-10-CM | POA: Diagnosis not present

## 2023-06-25 DIAGNOSIS — M545 Low back pain, unspecified: Secondary | ICD-10-CM | POA: Diagnosis not present

## 2023-06-26 ENCOUNTER — Other Ambulatory Visit (HOSPITAL_COMMUNITY): Payer: Self-pay | Admitting: Sports Medicine

## 2023-06-26 ENCOUNTER — Encounter (HOSPITAL_COMMUNITY): Payer: Self-pay | Admitting: Sports Medicine

## 2023-06-26 DIAGNOSIS — Z780 Asymptomatic menopausal state: Secondary | ICD-10-CM

## 2023-06-26 DIAGNOSIS — Z13820 Encounter for screening for osteoporosis: Secondary | ICD-10-CM

## 2023-07-19 DIAGNOSIS — I10 Essential (primary) hypertension: Secondary | ICD-10-CM | POA: Diagnosis not present

## 2023-07-19 DIAGNOSIS — Z6831 Body mass index (BMI) 31.0-31.9, adult: Secondary | ICD-10-CM | POA: Diagnosis not present

## 2023-07-26 ENCOUNTER — Ambulatory Visit: Payer: Self-pay | Admitting: Nurse Practitioner

## 2023-07-26 DIAGNOSIS — E1165 Type 2 diabetes mellitus with hyperglycemia: Secondary | ICD-10-CM

## 2023-07-26 DIAGNOSIS — I10 Essential (primary) hypertension: Secondary | ICD-10-CM

## 2023-07-26 DIAGNOSIS — E782 Mixed hyperlipidemia: Secondary | ICD-10-CM

## 2023-07-26 DIAGNOSIS — Z794 Long term (current) use of insulin: Secondary | ICD-10-CM

## 2023-08-01 DIAGNOSIS — R300 Dysuria: Secondary | ICD-10-CM | POA: Diagnosis not present

## 2023-08-01 DIAGNOSIS — Z6832 Body mass index (BMI) 32.0-32.9, adult: Secondary | ICD-10-CM | POA: Diagnosis not present

## 2023-08-01 DIAGNOSIS — E162 Hypoglycemia, unspecified: Secondary | ICD-10-CM | POA: Diagnosis not present

## 2023-08-02 DIAGNOSIS — E119 Type 2 diabetes mellitus without complications: Secondary | ICD-10-CM | POA: Diagnosis not present

## 2023-08-02 DIAGNOSIS — I10 Essential (primary) hypertension: Secondary | ICD-10-CM | POA: Diagnosis not present

## 2023-08-02 DIAGNOSIS — E7849 Other hyperlipidemia: Secondary | ICD-10-CM | POA: Diagnosis not present

## 2023-08-02 DIAGNOSIS — R42 Dizziness and giddiness: Secondary | ICD-10-CM | POA: Diagnosis not present

## 2023-08-02 DIAGNOSIS — Z1329 Encounter for screening for other suspected endocrine disorder: Secondary | ICD-10-CM | POA: Diagnosis not present

## 2023-08-02 DIAGNOSIS — E782 Mixed hyperlipidemia: Secondary | ICD-10-CM | POA: Diagnosis not present

## 2023-08-09 DIAGNOSIS — Z6832 Body mass index (BMI) 32.0-32.9, adult: Secondary | ICD-10-CM | POA: Diagnosis not present

## 2023-08-09 DIAGNOSIS — E119 Type 2 diabetes mellitus without complications: Secondary | ICD-10-CM | POA: Diagnosis not present

## 2023-08-09 DIAGNOSIS — M5432 Sciatica, left side: Secondary | ICD-10-CM | POA: Diagnosis not present

## 2023-08-09 DIAGNOSIS — R300 Dysuria: Secondary | ICD-10-CM | POA: Diagnosis not present

## 2023-08-09 DIAGNOSIS — I10 Essential (primary) hypertension: Secondary | ICD-10-CM | POA: Diagnosis not present

## 2023-08-21 IMAGING — MR MR HEAD W/O CM
12 of 14 series · 35 of 48 positions shown · non-contrast
Comparison: None Available.

CLINICAL DATA: Headache, pulsatile tinnitus, weakness, and hand
tremors. The patient declined IV contrast.



[Series 1: DWI · axial · 4.0mm · 0.88mm/px · z∈[-55,+84]mm · 2 of 36 slices shown (1 of 6)]
[im 1/36]
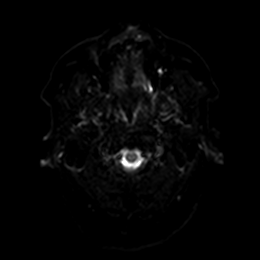
[im 36/36]
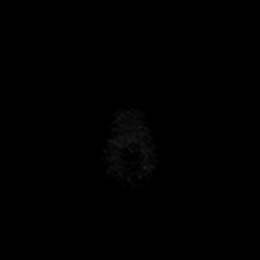

[Series 1: DWI · axial · 4.0mm · 0.88mm/px · z∈[-55,+84]mm · 2 of 36 slices shown (2 of 6)]
[im 1/36]
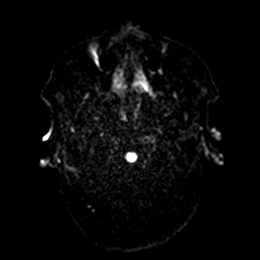
[im 36/36]
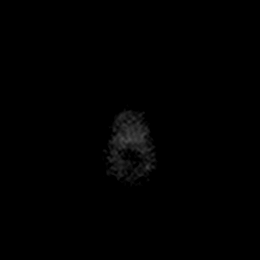

[Series 2: DWI · axial · 4.0mm · 0.88mm/px · z∈[-55,+84]mm · 2 of 35 slices shown (3 of 6)]
[im 1/35]
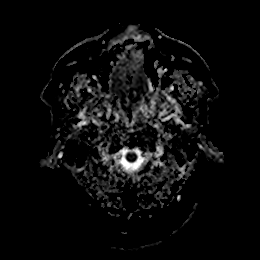
[im 35/35]
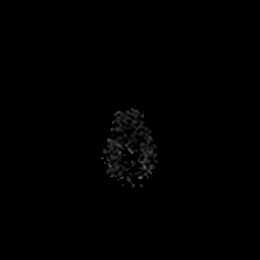

[Series 3: DWI · coronal · 4.0mm · 0.88mm/px · 2 of 35 slices shown (4 of 6)]
[im 1/35]
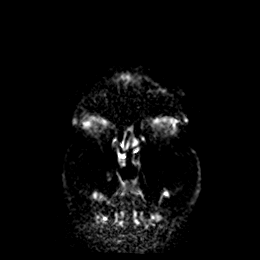
[im 35/35]
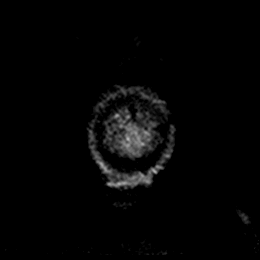

[Series 3: DWI · coronal · 4.0mm · 0.88mm/px · 2 of 35 slices shown (5 of 6)]
[im 1/35]
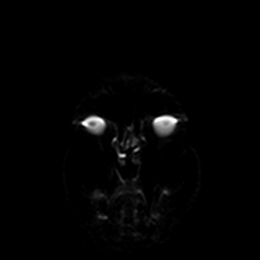
[im 35/35]
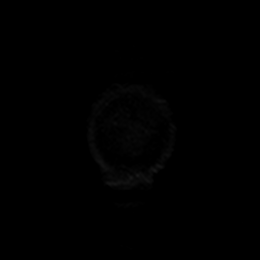

[Series 4: DWI · coronal · 4.0mm · 0.88mm/px · 2 of 35 slices shown (6 of 6)]
[im 1/35]
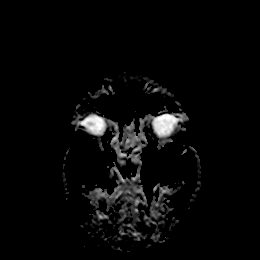
[im 35/35]
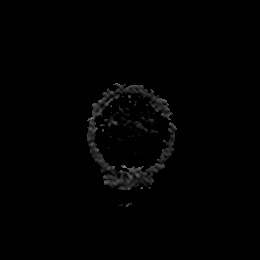

[Series 19: T1 · sagittal · 5.0mm · 0.80mm/px · 1 of 23 slices shown]
[im 1/23]
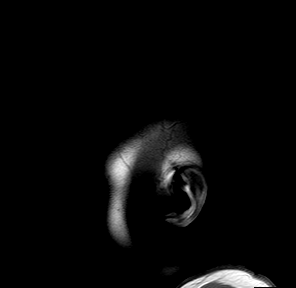

[Series 20: T2 · axial · 5.0mm · 0.72mm/px · 1 of 23 slices shown (1 of 2)]
[im 1/23]
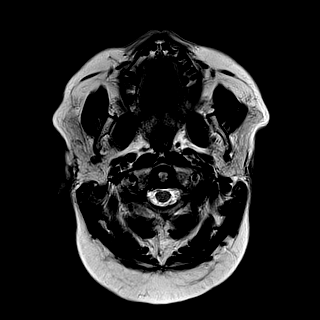

[Series 21: ax hemo · axial · 5.0mm · 0.86mm/px · 1 of 25 slices shown]
[im 1/25]
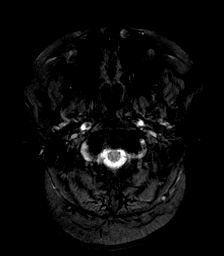

[Series 22: FLAIR · axial · 4.0mm · 0.43mm/px · z∈[-58,+89]mm · 2 of 38 slices shown]
[im 1/38]
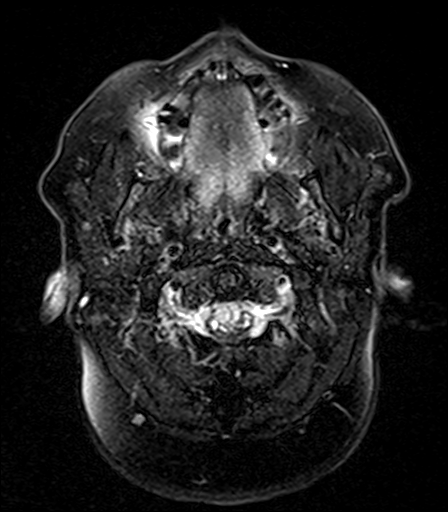
[im 38/38]
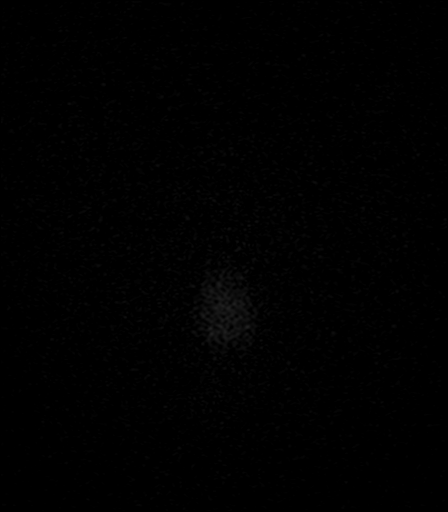

[Series 24: T2 · coronal · 5.0mm · 0.72mm/px · 1 of 28 slices shown (2 of 2)]
[im 1/28]
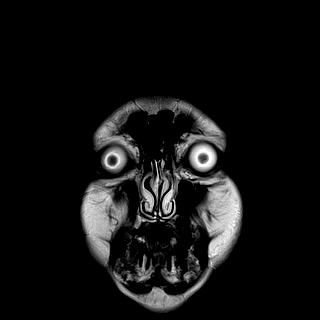

[Series 1056: tof_fl3d_tra_iso · axial · 0.9mm · 1.03mm/px · z∈[-285,+12]mm · 17 of 332 slices shown]
[im 1/332]
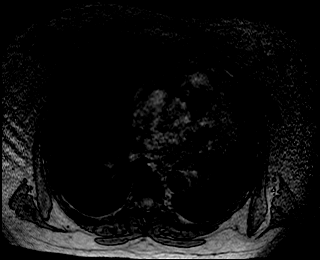
[im 21/332]
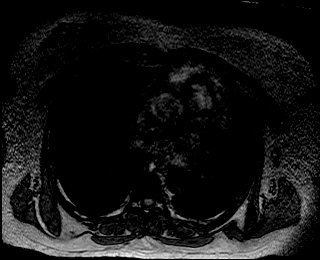
[im 42/332]
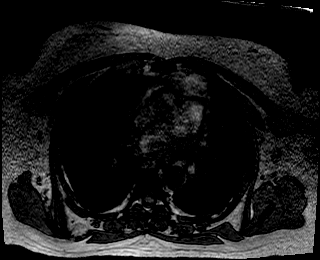
[im 63/332]
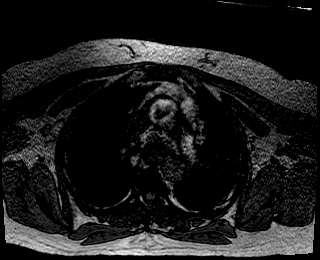
[im 83/332]
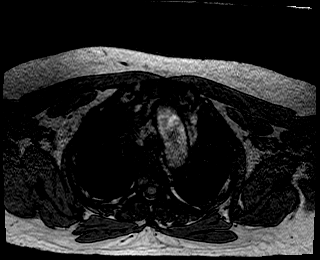
[im 104/332]
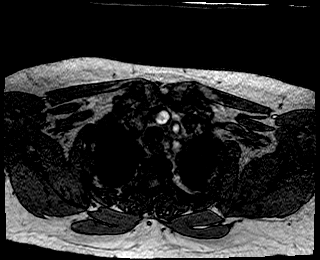
[im 125/332]
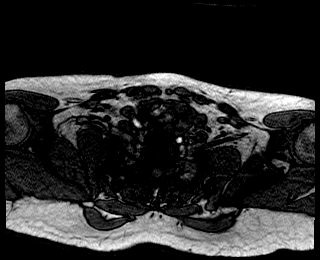
[im 145/332]
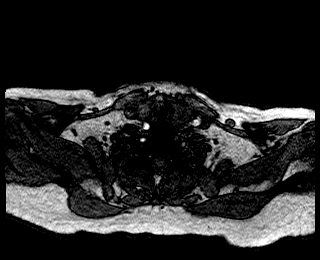
[im 166/332]
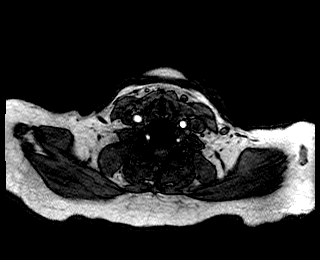
[im 187/332]
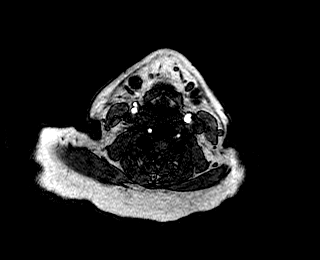
[im 207/332]
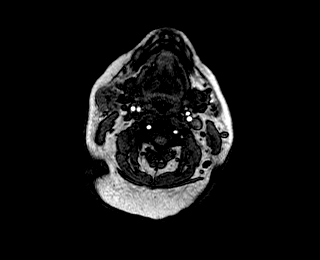
[im 228/332]
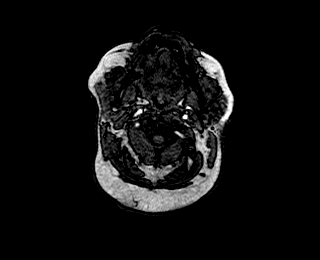
[im 249/332]
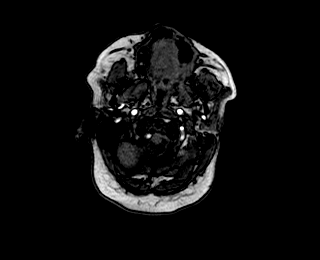
[im 269/332]
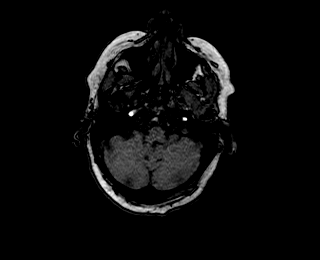
[im 290/332]
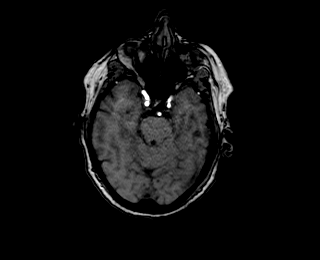
[im 311/332]
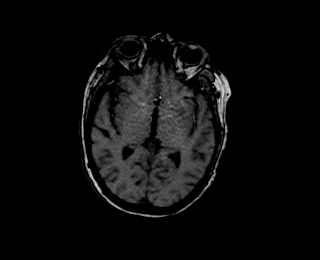
[im 332/332]
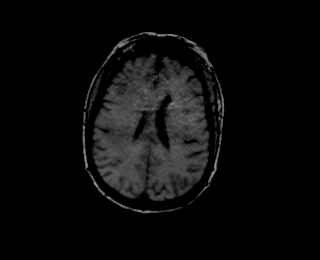

[35 of 48 positions shown; findings below may reference images not displayed]

FINDINGS: MRI HEAD FINDINGS

Brain: There is no evidence of an acute infarct, intracranial
hemorrhage, mass, midline shift, or extra-axial fluid collection.
The ventricles and sulci are normal. Small T2 hyperintensities in
the cerebral white matter bilaterally are nonspecific but compatible
with mild chronic small vessel ischemic disease.

Vascular: Major intracranial vascular flow voids are preserved.

Skull and upper cervical spine: Unremarkable bone marrow signal.

Sinuses/Orbits: Unremarkable orbits. Paranasal sinuses and mastoid
air cells are clear.

Other: None.

MRA HEAD FINDINGS

The included intracranial portions of the vertebral arteries are
widely patent to the basilar with the right being dominant. Patent
right PICA and bilateral SCA origins are visualized. A left PICA is
grossly patent although its origin was not included. AICAs are not
clearly identified. The basilar artery is widely patent. Posterior
communicating arteries are diminutive or absent. Both PCAs are
patent without evidence of a significant proximal stenosis.

The internal carotid arteries are widely patent from skull base to
carotid termini. ACAs and MCAs are patent without evidence of a
proximal branch occlusion or significant proximal stenosis. No
aneurysm or vascular malformation is identified.

MRA NECK FINDINGS

Image quality is degraded by noncontrast technique and mild motion
artifact. The aortic arch and proximal arch vessels are not well
evaluated.

The common carotid and cervical internal carotid arteries are patent
without evidence of a significant stenosis or dissection, although
the proximal portion of the right common carotid artery is
particularly not well evaluated.

The vertebral arteries are patent with antegrade flow bilaterally
and with the right being dominant, however the V1 segments,
including the vertebral origins, are poorly evaluated, particularly
on the left which precludes assessment for an underlying stenosis.
No flow limiting stenosis is evident more distally.
IMPRESSION: 1. No acute intracranial abnormality.
2. Mild chronic small vessel ischemic disease.
3. Negative head MRA.
4. Patent cervical carotid and vertebral arteries. Limited
assessment of the proximal vertebral and common carotid arteries due
to noncontrast technique. No evidence of a flow limiting stenosis or
dissection elsewhere.

## 2023-09-10 ENCOUNTER — Other Ambulatory Visit: Payer: Self-pay | Admitting: Nurse Practitioner

## 2023-09-10 ENCOUNTER — Ambulatory Visit: Attending: Nurse Practitioner

## 2023-09-10 ENCOUNTER — Encounter: Payer: Self-pay | Admitting: Nurse Practitioner

## 2023-09-10 ENCOUNTER — Telehealth: Payer: Self-pay | Admitting: Nurse Practitioner

## 2023-09-10 ENCOUNTER — Ambulatory Visit: Attending: Nurse Practitioner | Admitting: Nurse Practitioner

## 2023-09-10 VITALS — BP 139/80 | HR 78 | Ht 62.0 in | Wt 186.0 lb

## 2023-09-10 DIAGNOSIS — R42 Dizziness and giddiness: Secondary | ICD-10-CM

## 2023-09-10 DIAGNOSIS — E785 Hyperlipidemia, unspecified: Secondary | ICD-10-CM | POA: Diagnosis not present

## 2023-09-10 DIAGNOSIS — E7800 Pure hypercholesterolemia, unspecified: Secondary | ICD-10-CM

## 2023-09-10 DIAGNOSIS — I10 Essential (primary) hypertension: Secondary | ICD-10-CM | POA: Diagnosis not present

## 2023-09-10 DIAGNOSIS — I959 Hypotension, unspecified: Secondary | ICD-10-CM | POA: Diagnosis not present

## 2023-09-10 DIAGNOSIS — I471 Supraventricular tachycardia, unspecified: Secondary | ICD-10-CM | POA: Diagnosis not present

## 2023-09-10 NOTE — Telephone Encounter (Signed)
 Checking percert on the following   LONG TERM MONITOR-LIVE TELEMETRY (3-14 DAYS)

## 2023-09-10 NOTE — Progress Notes (Signed)
 Cardiology Office Note   Date:  09/10/2023 ID:  TAYVA EASTERDAY, DOB [birth year], MRN [id number] PCP: Dow Longs, PA-C  [facility] Providers Cardiologist:  Shlomo Corning, MD Click to update primary MD,subspecialty MD or APP then REFRESH:1}    History of Present Illness Kelly Wiley is a 68 y.o. female with a PMH of palpitations, hypertension, type 2 diabetes, SVT, fibromyalgia, hyperlipidemia, GERD, cervical spine disease, who presents today for overdue follow-up.   Last seen by Dr. Corning Shlomo on June 01, 2021.  Was doing well at the time.  She is here for overdue follow-up.  She admits to labile BP readings. Does admit to episodes of low BP, admits to sensation of lightheadedness, dizziness and feeling off. Admits to episodes of nausea. Has been having these spells for a long time per her report. Denies any syncopal episodes, has to lay down for the episodes to resolve. Denies any chest pain, shortness of breath, palpitations, syncope, orthopnea, PND, swelling or significant weight changes, acute bleeding, or claudication.   ROS: Negative. See HPI.   Studies Reviewed  EKG:  EKG Interpretation Date/Time:  Monday September 10 2023 14:40:31 EDT Ventricular Rate:  78 PR Interval:  180 QRS Duration:  92 QT Interval:  398 QTC Calculation: 453 R Axis:   -81  Text Interpretation: Normal sinus rhythm Left axis deviation Incomplete right bundle branch block Cannot rule out Anterior infarct , age undetermined No previous ECGs available Confirmed by Miriam Norris [phone]) on 09/10/2023 2:48:47 PM   Cardiac monitor 02/2021:  10 day monitor Rare supraventricular ectopy in the form of isolated PACs, couplets, triplets. Rare runs of SVT up to 17 beats Occasional ventricular ectopy in the form of isolated PVCs (1%). Rare couplets. Symptoms correlated with sinus rhythm and PVCs     Patch Wear Time:  10 days and 6 hours (2022-10-26T08:54:34-0400 to 2022-11-05T15:29:03-398)   Patient  had a min HR of 60 bpm, max HR of 218 bpm, and avg HR of 83 bpm. Predominant underlying rhythm was Sinus Rhythm. 4 Supraventricular Tachycardia runs occurred, the run with the fastest interval lasting 9 beats with a max rate of 218 bpm, the  longest lasting 17 beats with an avg rate of 174 bpm. Isolated SVEs were rare (<1.0%), SVE Couplets were rare (<1.0%), and SVE Triplets were rare (<1.0%). Isolated VEs were occasional (1.0%, 12256), VE Couplets were rare (<1.0%, 2), and no VE Triplets  were present. Ventricular Bigeminy and Trigeminy were present.    Echo 04/2015: The study quality is good.  The left ventricular chamber size is normal.  Mild concentric left ventricular hypertrophy is observed.  Global left ventricular wall motion and contractility are within normal limits.  The estimated EF is 60-65%.  Abnormal left ventricular diastolic function is observed.  Left atrial chamber size is normal.  Mild aortic cusp sclerosis is present.  Mitral valve leaflets appear normal.  Right ventricular systolic pressure is calculated at 14 mmHg.        Physical Exam VS:  BP 139/80   Pulse 78   Ht 5' 2 (1.575 m)   Wt 186 lb (84.4 kg)   SpO2 98%   BMI 34.02 kg/m        Wt Readings from Last 3 Encounters:  09/10/23 186 lb (84.4 kg)  02/13/23 182 lb 6.4 oz (82.7 kg)  01/01/23 185 lb (83.9 kg)    GEN: Well nourished, well developed in no acute distress NECK: No JVD; No carotid bruits CARDIAC: S1/S2,  RRR, no murmurs, rubs, gallops RESPIRATORY:  Clear to auscultation without rales, wheezing or rhonchi  ABDOMEN: Soft, non-tender, non-distended EXTREMITIES:  No edema; No deformity   ASSESSMENT AND PLAN  Dizziness, lightheadedness, low BP Etiology unclear. Does have a past hx of kidney stones. Denies any issues of dehydration, low blood sugar, or vertigo. Denies any syncope.  Will arrange monitor and echocardiogram for further evaluation. Will request labs from her PCP.  Will bring her back  for nurse visit to evaluate her blood pressure trends.  No medication changes at this time.  Heart healthy diet recommended.  HTN Blood pressure on recheck stable today.  She does admit to some labile blood pressure readings as well as low blood pressure readings.  Etiology unclear.  Will bring her back for nurse visit to reevaluate her BP.  No other medication changes at this time.  Low salt, heart healthy diet recommended.  Continue follow-up with PCP.  SVT Denies any tachycardia or palpitations.  She does admit to some dizziness/lightheadedness with etiology unclear.  Arranging monitor as noted above.  Heart rate is well-controlled today.  No medication changes at this time.  Heart healthy diet recommended.  Care and ED precautions discussed.  HLD No recent labs on file.  Will request most recent labs from PCP.  No medication changes at this time.  Heart healthy diet recommended.  Dispo: Follow-up with me/APP in 6 to 8 weeks or sooner if anything changes.  Signed, Almarie Crate, NP

## 2023-09-10 NOTE — Patient Instructions (Addendum)
 Medication Instructions:  Your physician recommends that you continue on your current medications as directed. Please refer to the Current Medication list given to you today.  Labwork: None   Testing/Procedures: Your physician has requested that you have an echocardiogram. Echocardiography is a painless test that uses sound waves to create images of your heart. It provides your doctor with information about the size and shape of your heart and how well your heart's chambers and valves are working. This procedure takes approximately one hour. There are no restrictions for this procedure. Please do NOT wear cologne, perfume, aftershave, or lotions (deodorant is allowed). Please arrive 15 minutes prior to your appointment time.  Please note: We ask at that you not bring children with you during ultrasound (echo/ vascular) testing. Due to room size and safety concerns, children are not allowed in the ultrasound rooms during exams. Our front office staff cannot provide observation of children in our lobby area while testing is being conducted. An adult accompanying a patient to their appointment will only be allowed in the ultrasound room at the discretion of the ultrasound technician under special circumstances. We apologize for any inconvenience. ZIO- Long Term Monitor Instructions   Your physician has requested you wear your ZIO patch monitor 14 days.   This is a single patch monitor.  [employer] supplies one patch monitor per enrollment.  Additional stickers are not available.   Please do not apply patch if you will be having a Nuclear Stress Test, Echocardiogram, Cardiac CT, MRI, or Chest Xray during the time frame you would be wearing the monitor. The patch cannot be worn during these tests.  You cannot remove and re-apply the ZIO XT patch monitor.   Your ZIO patch monitor will be sent USPS Priority mail from [facility] directly to your home address. The monitor may also be mailed to a PO  BOX if home delivery is not available.   It may take 3-5 days to receive your monitor after you have been enrolled.   Once you have received you monitor, please review enclosed instructions.  Your monitor has already been registered assigning a specific monitor serial # to you.   Applying the monitor   Shave hair from upper left chest.   Hold abrader disc by orange tab.  Rub abrader in 40 strokes over left upper chest as indicated in your monitor instructions.   Clean area with 4 enclosed alcohol  pads .  Use all pads to assure are is cleaned thoroughly.  Let dry.   Apply patch as indicated in monitor instructions.  Patch will be place under collarbone on left side of chest with arrow pointing upward.   Rub patch adhesive wings for 2 minutes.Remove white label marked 1.  Remove white label marked 2.  Rub patch adhesive wings for 2 additional minutes.   While looking in a mirror, press and release button in center of patch.  A small green light will flash 3-4 times .  This will be your only indicator the monitor has been turned on.     Do not shower for the first 24 hours.  You may shower after the first 24 hours.   Press button if you feel a symptom. You will hear a small click.  Record Date, Time and Symptom in the Patient Log Book.   When you are ready to remove patch, follow instructions on last 2 pages of Patient Log Book.  Stick patch monitor onto last page of Patient Log Book.  Place Patient Log Book in [place] box.  Use locking tab on box and tape box closed securely.  The [employer] has [employer] on it.  Please place in mailbox as soon as possible.  Your physician should have your test results approximately 7 days after the monitor has been mailed back to [facility].   Call [facility]: [facility] Customer Care at [phone] if you have questions regarding your ZIO XT patch monitor.  Call them immediately if you see an orange light blinking on your monitor.   If  your monitor falls off in less than 4 days contact our Monitor department at [phone].  If your monitor becomes loose or falls off after 4 days call [employer] at [phone] for suggestions on securing your monitor.  Follow-Up: Your physician recommends that you schedule a follow-up appointment in: 6-8 weeks   Any Other Special Instructions Will Be Listed Below (If Applicable).  If you need a refill on your cardiac medications before your next appointment, please call your pharmacy.

## 2023-09-12 DIAGNOSIS — F322 Major depressive disorder, single episode, severe without psychotic features: Secondary | ICD-10-CM | POA: Diagnosis not present

## 2023-09-12 DIAGNOSIS — F411 Generalized anxiety disorder: Secondary | ICD-10-CM | POA: Diagnosis not present

## 2023-09-12 DIAGNOSIS — Z1331 Encounter for screening for depression: Secondary | ICD-10-CM | POA: Diagnosis not present

## 2023-09-12 DIAGNOSIS — F4321 Adjustment disorder with depressed mood: Secondary | ICD-10-CM | POA: Diagnosis not present

## 2023-09-17 DIAGNOSIS — M461 Sacroiliitis, not elsewhere classified: Secondary | ICD-10-CM | POA: Diagnosis not present

## 2023-09-18 ENCOUNTER — Ambulatory Visit (HOSPITAL_COMMUNITY): Payer: Medicare Other | Attending: Urology

## 2023-09-20 DIAGNOSIS — R42 Dizziness and giddiness: Secondary | ICD-10-CM | POA: Diagnosis not present

## 2023-09-27 DIAGNOSIS — M461 Sacroiliitis, not elsewhere classified: Secondary | ICD-10-CM | POA: Diagnosis not present

## 2023-10-01 ENCOUNTER — Ambulatory Visit: Payer: Medicare Other | Admitting: Urology

## 2023-10-01 NOTE — Progress Notes (Deleted)
 Name: Kelly Wiley DOB: [birth year] MRN: [id number]  History of Present Illness: Kelly Wiley is a 68 y.o. female who presents today for follow up visit at [facility].  Relevant History includes: 1. Recurrent UTls. - Her typical UTI symptoms include low back pain, bladder pain, increased urinary urgency, frequency. Does not typically experience dysuria with UTI.  - Past history of pyelonephritis in childhood. - Prior episode of urosepsis in 2023 (in [place] ). - Previously followed by Dr. Alvia at [facility]. Cystoscopy was unremarkable in March 2018 and on 05/18/2022.  - Risk factors include T2DM, vaginal atrophy, occasional fecal incontinence.  - Previously took Nitrofurantoin daily and Ellura supplement for UTI prophylaxis.  - Currently taking Keflex  250 mg daily for UTI prevention. 2. Vaginal atrophy. - Using topical vaginal estrogen cream 2x/week. 3. Kidney stones.  - 11/28/2022: Left ESWL by Dr. Sherrilee for left intrarenal stone thought to be a potential infectious nidus. 4. Mixed urinary incontinence; stress predominant.  - Not significantly bothersome per patient.  At last visit on 04/03/2023: No acute findings. The plan was:  Maintain adequate fluid intake daily. Drink citrus juice (lemon, lime or orange juice) routinely. Low oxalate diet. Alfuzosin  10 mg daily PRN plus OTC analgesics PRN for stone symptoms. Continue Keflex  250 mg daily for UTI prevention. Continue topical vaginal estrogen cream 2x/week. Return in about 6 months (around 10/01/2023) for RUS, KUB, UA, PVR, & f/u with [name] NP.  Since last visit: No show for RUS on 09/18/2023.  Today: KUB today: Awaiting radiology read; *** appreciated per provider interpretation.  She reports *** UTIs since last visit.   She {Actions; denies-reports:120008} recent suspected stone migration / passage. She {Actions; denies-reports:120008} acute flank pain / abdominal  pain.  She {Actions; denies-reports:120008} increased urinary urgency, frequency, nocturia, dysuria, gross hematuria, hesitancy, straining to void, or sensations of incomplete emptying.   Medications: Current Outpatient Medications  Medication Sig Dispense Refill   alfuzosin  (UROXATRAL ) 10 MG 24 hr tablet TAKE 1 TABLET BY MOUTH EVERY DAY AS NEEDED FOR KIDNEY STONE 30 tablet 0   calcium carbonate (TUMS - DOSED IN MG ELEMENTAL CALCIUM) 500 MG chewable tablet Chew 1 tablet by mouth 2 (two) times daily.     carvedilol  (COREG ) 12.5 MG tablet TAKE (1) TABLET TWICE DAILY. 60 tablet 9   cephALEXin  (KEFLEX ) 250 MG capsule Take 1 capsule (250 mg total) by mouth daily. 30 capsule 11   cloNIDine (CATAPRES) 0.1 MG tablet Take 0.1 mg by mouth 2 (two) times daily. (Patient not taking: Reported on 09/10/2023)     diclofenac (VOLTAREN) 75 MG EC tablet Take 75 mg by mouth 2 (two) times daily.     dimenhyDRINATE (MOTION SICKNESS RELIEF) 50 MG tablet Take 50 mg by mouth in the morning and at bedtime.     estradiol  (ESTRACE ) 0.1 MG/GM vaginal cream Discard plastic applicator. Insert a blueberry size amount (approximately 1 gram) of cream on fingertip inside vagina at bedtime two nights per week. 30 g 3   ezetimibe  (ZETIA ) 10 MG tablet Take 10 mg by mouth daily. (Patient not taking: Reported on 09/10/2023)     HUMALOG KWIKPEN 100 UNIT/ML KwikPen 30 Units 2 (two) times daily before a meal.     HYDROcodone -acetaminophen  (NORCO) 10-325 MG tablet Take 1 tablet by mouth every 6 (six) hours as needed. 30 tablet 0   hyoscyamine (LEVSIN) 0.125 MG tablet Take 0.125 mg by mouth every 4 (four) hours as needed for cramping.  LANTUS SOLOSTAR 100 UNIT/ML Solostar Pen SMARTSIG:60 Unit(s) SUB-Q Daily     methocarbamol (ROBAXIN) 500 MG tablet Take 500 mg by mouth daily.     NONFORMULARY OR COMPOUNDED ITEM Ellura supplement: 36 mg of soluble Proanthocyanidin (PAC). Take 1 capsule by mouth once daily in the morning to prevent  recurrent UTI.  Can be purchased via online vendors or obtained at a discount by sending this prescription to: [facility] Fax #: [phone] Address: [address] [place], [place], [place] [address] NCPD# [id number] 30 each 11   pantoprazole  (PROTONIX ) 40 MG tablet Take 40 mg by mouth 2 (two) times daily.     Simethicone  (GAS-X PO) Take by mouth.     tiZANidine (ZANAFLEX) 4 MG tablet Take 4 mg by mouth at bedtime.     Vitamin D, Ergocalciferol, (DRISDOL) 1.25 MG (50000 UNIT) CAPS capsule Take 50,000 Units by mouth 2 (two) times a week.     No current facility-administered medications for this visit.    Allergies: Allergies  Allergen Reactions   Ativan [Lorazepam]     Lowered blood pressure   Iodine 131 Other (See Comments)    Blacked out  Dye*    Phenytoin Sodium Extended Other (See Comments)    Killed white blood cell at age of 10  Dilantin    Sulfa Antibiotics Other (See Comments)    Bones    Zarontin [Ethosuximide]     Pt does not remember reaction     Past Medical History:  Diagnosis Date   Cervical spine disease    DM (diabetes mellitus) (HCC)    Dysphagia    Fibromyalgia    GERD (gastroesophageal reflux disease)    HTN (hypertension)    Hypercholesteremia    PONV (postoperative nausea and vomiting)    Seasonal allergies    Seizures (HCC)    last one > 20 years ago   Past Surgical History:  Procedure Laterality Date   BACK SURGERY     BALLOON DILATION N/A 09/19/2021   Procedure: BALLOON DILATION;  Surgeon: Cindie Carlin POUR, DO;  [facility];  Service: Endoscopy;  Laterality: N/A;   BIOPSY  09/19/2021   Procedure: BIOPSY;  Surgeon: Cindie Carlin POUR, DO;  [facility];  Service: Endoscopy;;  gastric;random colon;   CARPAL TUNNEL RELEASE  RIGHT   CERVICAL SPINE SURGERY  06/19/2010   COLONOSCOPY  2008 [facility]    [facility]   COLONOSCOPY WITH PROPOFOL  N/A 09/19/2021   Procedure: COLONOSCOPY WITH PROPOFOL ;  Surgeon: Cindie Carlin POUR, DO;  [facility];  Service: Endoscopy;  Laterality: N/A;  12:00pm   ESOPHAGOGASTRODUODENOSCOPY  01/24/2011   DOQ:IBDEYJHPJ 2o TO CERVICAL PLATES IN PT'S NECK AND MILD ESO MOTILITY DISORDER/Mild gastritis   ESOPHAGOGASTRODUODENOSCOPY  03/03/2011   DOQ:fpoi gastritis    ESOPHAGOGASTRODUODENOSCOPY (EGD) WITH PROPOFOL  N/A 09/19/2021   Procedure: ESOPHAGOGASTRODUODENOSCOPY (EGD) WITH PROPOFOL ;  Surgeon: Cindie Carlin POUR, DO;  [facility];  Service: Endoscopy;  Laterality: N/A;   EXTRACORPOREAL SHOCK WAVE LITHOTRIPSY Right 11/28/2022   Procedure: EXTRACORPOREAL SHOCK WAVE LITHOTRIPSY (ESWL);  Surgeon: Sherrilee Belvie CROME, MD;  Location: [facility];  Service: Urology;  Laterality: Right;   FLEXIBLE SIGMOIDOSCOPY  01/24/2011   SLF: Internal Hemorrhoids   SAVORY DILATION  01/24/2011   Procedure: SAVORY DILATION;  Surgeon: Margo CHRISTELLA Haddock, MD;  [facility];  Service: Endoscopy;  Laterality: N/A;   SHOULDER SURGERY  RIGHT   TUBAL LIGATION  Family History  Problem Relation Age of Onset   Lung cancer Mother    Heart disease Mother        Mitral valve disease   Congenital heart disease Son        has a PPM   Diabetes Son    Colon cancer Neg Hx    Colon polyps Neg Hx    Social History   Socioeconomic History   Marital status: Widowed    Spouse name: Not on file   Number of children: Not on file   Years of education: Not on file   Highest education level: Not on file  Occupational History   Not on file  Tobacco Use   Smoking status: Former    Current packs/day: 0.00    Average packs/day: 0.5 packs/day for 2.0 years (1.0 ttl pk-yrs)    Types: Cigarettes    Start date: 10/12/1966    Quit date: 10/11/1968    Years since quitting: 55.0   Smokeless tobacco: Never  Vaping Use   Vaping status: Never Used  Substance and Sexual Activity   Alcohol  use: Yes    Alcohol /week: 0.0 standard drinks of alcohol     Comment: occasionally   Drug use: No    Sexual activity: Not Currently  Other Topics Concern   Not on file  Social History Narrative   MARRIED, 2 KIDS, YOUNGEST 31 YO.   Son has DM, malabsorption   Works in Audiological scientist in [place]   EtOH: rare   No tobacco products   Social Drivers of Corporate investment banker Strain: Not on file  Food Insecurity: No Food Insecurity (11/11/2021)   Received from [facility]   Hunger Vital Sign    Within the past 12 months, you worried that your food would run out before you got the money to buy more.: Never true    Within the past 12 months, the food you bought just didn't last and you didn't have money to get more.: Never true  Transportation Needs: No Transportation Needs (01/19/2022)   Received from [facility] - Transportation    Lack of Transportation (Medical): No    Lack of Transportation (Non-Medical): No  Physical Activity: Not on file  Stress: No Stress Concern Present (11/02/2020)   Received from [facility] - Occupational Stress Questionnaire    Feeling of Stress : Not at all  Social Connections: Unknown (07/28/2021)   Received from [facility]   Social Network    Social Network: Not on file  Intimate Partner Violence: Unknown (06/23/2021)   Received from [facility]   HITS    Physically Hurt: Not on file    Insult or Talk Down To: Not on file    Threaten Physical Harm: Not on file    Scream or Curse: Not on file    Review of Systems Constitutional: Patient denies any unintentional weight loss or change in strength lntegumentary: Patient denies any rashes or pruritus Cardiovascular: Patient denies chest pain or syncope Respiratory: Patient denies shortness of breath Gastrointestinal: ***Patient denies nausea, vomiting, constipation, or diarrhea ***As per HPI Musculoskeletal: Patient denies muscle cramps or weakness Neurologic: Patient denies convulsions or seizures Allergic/Immunologic: Patient denies  recent allergic reaction(s) Hematologic/Lymphatic: Patient denies bleeding tendencies Endocrine: Patient denies heat/cold intolerance  GU: As per HPI.  OBJECTIVE There were no vitals filed for this visit. There is no height or weight on file to calculate BMI.  Physical  Examination Constitutional: No obvious distress; patient is non-toxic appearing  Cardiovascular: No visible lower extremity edema.  Respiratory: The patient does not have audible wheezing/stridor; respirations do not appear labored  Gastrointestinal: Abdomen non-distended Musculoskeletal: Normal ROM of UEs  Skin: No obvious rashes/open sores  Neurologic: CN 2-12 grossly intact Psychiatric: Answered questions appropriately with normal affect  Hematologic/Lymphatic/Immunologic: No obvious bruises or sites of spontaneous bleeding  UA: ***negative ***positive for *** leukocytes, *** blood, ***nitrites Urine microscopy: *** WBC/hpf, *** RBC/hpf, *** bacteria ***glucosuria (secondary to ***Jardiance ***Farxiga use) ***otherwise unremarkable  PVR: *** ml  ASSESSMENT Kidney stones  Recurrent UTI  Atrophic vaginitis  Mixed stress and urge urinary incontinence ***  We agreed to plan for follow up in *** months / ***1 year or sooner if needed. Patient verbalized understanding of and agreement with current plan. All questions were answered.  PLAN Advised the following: 1. *** 2. ***No follow-ups on file.  No orders of the defined types were placed in this encounter.   It has been explained that the patient is to follow regularly with their PCP in addition to all other providers involved in their care and to follow instructions provided by these respective offices. Patient advised to contact urology clinic if any urologic-pertaining questions, concerns, new symptoms or problems arise in the interim period.  There are no Patient Instructions on file for this visit.  Electronically signed by:  Lauraine JAYSON Oz, FNP    10/01/23    10:01 AM

## 2023-10-01 NOTE — Progress Notes (Unsigned)
 GI Office Note    Referring Provider: Dow Longs, PA-C Primary Care Physician:  Dow Longs, PA-C Primary Gastroenterologist: Carlin POUR. Cindie, DO  Date:  10/02/2023  ID:  Adrien DELENA Lais, DOB [birth year], MRN [id number]  Chief Complaint   Chief Complaint  Patient presents with   Follow-up    Follow up. Having lots of gas and nausea    History of Present Illness  LEVORA WERDEN is a 68 y.o. female with a history of HTN, HLD, seizures, GERD, fibromyalgia, diabetes, IBS, dysphagia, and chronic nausea presenting today with complaint of gassiness and nausea.  OV 08/17/22.  Reported Reglan  caused her to have body pain and felt better after she stopped.  Nausea was good up until day before her office visit.  Realize she is not able to eat larger meals.  Taking Dramamine daily to help with nausea that occurs from her pain medication.  States her nausea makes her memory and brain fog worse.  Continues to watch the size of her bites of food given her dysphagia.  Reported 2 weeks flare of IBS with diarrhea.  Will have pain and eventually go to the bathroom and goes a lot.  Denied frequent bowel movements.  Her normal without IBS flare is 2-4 stools daily.  Noted to have more frequent UTIs recently.  Reflux well-controlled but with constant fullness sensation and gassiness.  Had recently started [place] women's probiotic but had only been taking for 2 days.  Advised to continue PPI twice daily, Dramamine as needed and Zofran  as needed.  Continue smaller more frequent meals and to use Gas-X, Beano, or Phazyme as needed.  Continue daily probiotic advised that we could consider Xifaxan  in the future and also consider fecal elastase and TSH if ongoing diarrhea.   OV 10/18/2022.  Continue to experience bloating and brief worsening of diarrhea.  Overall nausea was fairly well-controlled with Dramamine.  Recommendations: Xifaxan  550 mg 3 times daily for 2 weeks.  Fecal elastase and celiac labs in the  future if Xifaxan  not helpful.  Request labs from PCP.  Low FODMAP diet.  Avoid lactose.  Continue Dramamine and Levsin.  Continue Gas-X, add famotidine for severe symptoms.  Gastroparesis diet.  PPI BID.  Last office visit 02/13/23.  Reportedly only had about 2-3 bouts of diarrhea since her last visit.  Was not taking Imodium, Gas-X, or Levsin.  Had continue Keefer and yogurt  about 3 days/week and had recently been increasing fruit intake.  Not having much reflux issues with still having a 4-day physician.  Still taking her pantoprazole  twice daily.  Even without nausea medication her nausea has been better.  Had been taking Dramamine with oxycodone.  Unable to identify any triggers.  Did report some issues with kidney stones and UTIs in the past.  On preventative Keflex . Gave handout for gastroparesis diet.  Advised pantoprazole  40 mg twice daily.  Check fecal elastase and alpha gal.  Avoid lactose.  Continue Dramamine and Levsin as needed.  Resume Gas-X if needed. Follow up 4 months.    Today:  Son got divorced and moved back in with her a couple months ago.   Still having gassiness and nausea. Used to like cauliflower raw and no issues with brocolli. No issues with squash or zucchini and not much trouble with romaine but does have issues with iceburg   Having trouble eating good nutritious food and gets pain.   Admits to occasional snacking but her biggest thing with frustration is  the weight.   Nausea is there all the time but does feel worse in the mornings. Has a hard time getting up and going in the mornings. Rarely feels hungry. Has ben tacking more levisn in the past month than she has taken in the past year. Having bout of diarrhea - within the last month a couple times per week. Typically may go 3-4 times and states its shifting a little bit here recently as well. Had to take a stool softener for about 2 days a couple weeks ago. Has pain before the BM and gets better after. No pain when  she was feeling constipated.   Not using the keefer as much here recently either.   She does take hydrocodone  and has never had the constipation issues before.  No blood in the stool and no melena.     Wt Readings from Last 5 Encounters:  10/02/23 183 lb 9.6 oz (83.3 kg)  09/10/23 186 lb (84.4 kg)  02/13/23 182 lb 6.4 oz (82.7 kg)  01/01/23 185 lb (83.9 kg)  11/24/22 185 lb (83.9 kg)    Current Outpatient Medications  Medication Sig Dispense Refill   alfuzosin  (UROXATRAL ) 10 MG 24 hr tablet TAKE 1 TABLET BY MOUTH EVERY DAY AS NEEDED FOR KIDNEY STONE 30 tablet 0   calcium carbonate (TUMS - DOSED IN MG ELEMENTAL CALCIUM) 500 MG chewable tablet Chew 1 tablet by mouth 2 (two) times daily.     carvedilol  (COREG ) 12.5 MG tablet TAKE (1) TABLET TWICE DAILY. 60 tablet 9   cephALEXin  (KEFLEX ) 250 MG capsule Take 1 capsule (250 mg total) by mouth daily. 30 capsule 11   diclofenac (VOLTAREN) 75 MG EC tablet Take 75 mg by mouth 2 (two) times daily.     dimenhyDRINATE (MOTION SICKNESS RELIEF) 50 MG tablet Take 50 mg by mouth in the morning and at bedtime.     estradiol  (ESTRACE ) 0.1 MG/GM vaginal cream Discard plastic applicator. Insert a blueberry size amount (approximately 1 gram) of cream on fingertip inside vagina at bedtime two nights per week. 30 g 3   HUMALOG KWIKPEN 100 UNIT/ML KwikPen 30 Units 2 (two) times daily before a meal.     HYDROcodone -acetaminophen  (NORCO) 10-325 MG tablet Take 1 tablet by mouth every 6 (six) hours as needed. 30 tablet 0   hyoscyamine (LEVSIN) 0.125 MG tablet Take 0.125 mg by mouth every 4 (four) hours as needed for cramping.     LANTUS SOLOSTAR 100 UNIT/ML Solostar Pen SMARTSIG:60 Unit(s) SUB-Q Daily     methocarbamol (ROBAXIN) 500 MG tablet Take 500 mg by mouth daily.     NONFORMULARY OR COMPOUNDED ITEM [place]ura supplement: 36 mg of soluble Proanthocyanidin (PAC). Take 1 capsule by mouth once daily in the morning to prevent recurrent UTI.  Can be purchased via  online vendors or obtained at a discount by sending this prescription to: [facility] Fax #: [phone] Address: [address] [place], [place], [place] [address] NCPD# [id number] 30 each 11   pantoprazole  (PROTONIX ) 40 MG tablet Take 40 mg by mouth 2 (two) times daily.     Simethicone  (GAS-X PO) Take by mouth.     tiZANidine (ZANAFLEX) 4 MG tablet Take 4 mg by mouth at bedtime.     Vitamin D, Ergocalciferol, (DRISDOL) 1.25 MG (50000 UNIT) CAPS capsule Take 50,000 Units by mouth 2 (two) times a week.     cloNIDine (CATAPRES) 0.1 MG tablet Take 0.1 mg by mouth 2 (two) times daily. (  Patient not taking: Reported on 10/02/2023)     ezetimibe  (ZETIA ) 10 MG tablet Take 10 mg by mouth daily. (Patient not taking: Reported on 10/02/2023)     No current facility-administered medications for this visit.    Past Medical History:  Diagnosis Date   Cervical spine disease    DM (diabetes mellitus) (HCC)    Dysphagia    Fibromyalgia    GERD (gastroesophageal reflux disease)    HTN (hypertension)    Hypercholesteremia    PONV (postoperative nausea and vomiting)    Seasonal allergies    Seizures (HCC)    last one > 20 years ago    Past Surgical History:  Procedure Laterality Date   BACK SURGERY     BALLOON DILATION N/A 09/19/2021   Procedure: BALLOON DILATION;  Surgeon: Cindie Carlin POUR, DO;  [facility];  Service: Endoscopy;  Laterality: N/A;   BIOPSY  09/19/2021   Procedure: BIOPSY;  Surgeon: Cindie Carlin POUR, DO;  [facility];  Service: Endoscopy;;  gastric;random colon;   CARPAL TUNNEL RELEASE  RIGHT   CERVICAL SPINE SURGERY  06/19/2010   COLONOSCOPY  2008 [facility]    [facility]   COLONOSCOPY WITH PROPOFOL  N/A 09/19/2021   Procedure: COLONOSCOPY WITH PROPOFOL ;  Surgeon: Cindie Carlin POUR, DO;  [facility];  Service: Endoscopy;  Laterality: N/A;  12:00pm   ESOPHAGOGASTRODUODENOSCOPY  01/24/2011   DOQ:IBDEYJHPJ 2o TO CERVICAL PLATES IN PT'S NECK AND  MILD ESO MOTILITY DISORDER/Mild gastritis   ESOPHAGOGASTRODUODENOSCOPY  03/03/2011   DOQ:fpoi gastritis    ESOPHAGOGASTRODUODENOSCOPY (EGD) WITH PROPOFOL  N/A 09/19/2021   Procedure: ESOPHAGOGASTRODUODENOSCOPY (EGD) WITH PROPOFOL ;  Surgeon: Cindie Carlin POUR, DO;  [facility];  Service: Endoscopy;  Laterality: N/A;   EXTRACORPOREAL SHOCK WAVE LITHOTRIPSY Right 11/28/2022   Procedure: EXTRACORPOREAL SHOCK WAVE LITHOTRIPSY (ESWL);  Surgeon: Sherrilee Belvie CROME, MD;  Location: [facility];  Service: Urology;  Laterality: Right;   FLEXIBLE SIGMOIDOSCOPY  01/24/2011   SLF: Internal Hemorrhoids   SAVORY DILATION  01/24/2011   Procedure: SAVORY DILATION;  Surgeon: Margo CHRISTELLA Haddock, MD;  [facility];  Service: Endoscopy;  Laterality: N/A;   SHOULDER SURGERY  RIGHT   TUBAL LIGATION      Family History  Problem Relation Age of Onset   Lung cancer Mother    Heart disease Mother        Mitral valve disease   Congenital heart disease Son        has a PPM   Diabetes Son    Colon cancer Neg Hx    Colon polyps Neg Hx     Allergies as of 10/02/2023 - Review Complete 10/02/2023  Allergen Reaction Noted   Phenytoin Shortness Of Breath 10/02/2023   Ativan [lorazepam]  07/05/2015   Iodine 131 Other (See Comments) 07/21/2010   Phenytoin sodium extended Other (See Comments) 07/21/2010   Sulfa antibiotics Other (See Comments) 07/21/2010   Zarontin [ethosuximide]  09/09/2021    Social History   Socioeconomic History   Marital status: Widowed    Spouse name: Not on file   Number of children: Not on file   Years of education: Not on file   Highest education level: Not on file  Occupational History   Not on file  Tobacco Use   Smoking status: Former    Current packs/day: 0.00    Average packs/day: 0.5 packs/day for 2.0 years (1.0 ttl pk-yrs)    Types: Cigarettes    Start  date: 10/12/1966    Quit date: 10/11/1968    Years since quitting: 55.0   Smokeless tobacco: Never  Vaping  Use   Vaping status: Never Used  Substance and Sexual Activity   Alcohol  use: Yes    Alcohol /week: 0.0 standard drinks of alcohol     Comment: occasionally   Drug use: No   Sexual activity: Not Currently  Other Topics Concern   Not on file  Social History Narrative   MARRIED, 2 KIDS, YOUNGEST 31 YO.   Son has DM, malabsorption   Works in Audiological scientist in [place]   EtOH: rare   No tobacco products   Social Drivers of Corporate investment banker Strain: Not on file  Food Insecurity: No Food Insecurity (11/11/2021)   Received from [facility]   Hunger Vital Sign    Within the past 12 months, you worried that your food would run out before you got the money to buy more.: Never true    Within the past 12 months, the food you bought just didn't last and you didn't have money to get more.: Never true  Transportation Needs: No Transportation Needs (01/19/2022)   Received from [facility] - Transportation    Lack of Transportation (Medical): No    Lack of Transportation (Non-Medical): No  Physical Activity: Not on file  Stress: No Stress Concern Present (11/02/2020)   Received from [facility] - Occupational Stress Questionnaire    Feeling of Stress : Not at all  Social Connections: Unknown (07/28/2021)   [facility] from [facility]   Social Network    Social Network: Not on file     Review of Systems   Gen: + fatigue. Denies fever, chills, anorexia. Denies weakness, weight loss.  CV: Denies chest pain, palpitations, syncope, peripheral edema, and claudication. Resp: Denies dyspnea at rest, cough, wheezing, coughing up blood, and pleurisy. GI: See HPI Derm: Denies rash, itching, dry skin Psych: Denies depression, anxiety, memory loss, confusion. No homicidal or suicidal ideation.  Heme: Denies bruising, bleeding, and enlarged lymph nodes.  Physical Exam   BP 138/79 (BP Location: Right Arm, Patient Position: Sitting,  Cuff Size: Large)   Pulse 73   Temp 97.9 F (36.6 C) (Temporal)   Ht 5' 2 (1.575 m)   Wt 183 lb 9.6 oz (83.3 kg)   BMI 33.58 kg/m   General:   Alert and oriented. No distress noted. Pleasant and cooperative.  Head:  Normocephalic and atraumatic. Eyes:  Conjuctiva clear without scleral icterus. Mouth:  Oral mucosa pink and moist. Good dentition. No lesions. Abdomen:  +BS, soft, non-distended. Mild ttp to epigastrium and lower abdomen. No rebound or guarding. No HSM or masses noted. Rectal: deferred Msk:  Symmetrical without gross deformities. Normal posture. Extremities:  Without edema. Neurologic:  Alert and  oriented x4 Psych:  Alert and cooperative. Normal mood and affect.  Assessment  MANAL KREUTZER is a 68 y.o. female presenting today with gassiness and nausea.   IBS-D, bloating: At last visit was doing fairly well, recently having flares of 3-5 bowel movements a day.  Continues to use Levsin as needed.  Will try again to get Xifaxan  covered for treatment.  She very well could be having some bowel hypersensitivity and/or leaky gut and dysbiosis of her gut microbiome.  Alpha gal negative in the past.  Has not performed pancreatic elastase to assess for pancreatic insufficiency.  We can consider this in the  future.  Discussed avoiding lactose today as this could be contributing to some of her bloating and diarrhea as well.  Advise she may continue to use Gas-X or use Phazyme if needed.  Has done well with yogurt and Keefer in the past and I advised her that she could resume this and be consistent if it has helped her in the past.  Symptoms could be recently exacerbated given stressful life events.  Nausea, early satiety, gastroparesis: Most likely gastroparesis as cause of symptoms.  Has failed Reglan  and erythromycin in the past secondary to side effects.  Continues to use Dramamine as needed and this works well for her.  No Zofran .  Has continued on pain medication which likely is  exacerbating her symptoms somewhat.  Has been having more trouble with broccoli and cauliflower as of late, we discussed cooking vegetables adequately to make them softer for absorption and eating smaller more frequent meals throughout the day.  Separate written handout provided today.  Given some intermittent constipation, if nausea worsens we may further consider Motegrity as an option for treatment.  Discussed that gastroparesis as well as IBS is likely contributing factor to bloating.  GERD: Fairly well-controlled with pantoprazole  40 mg twice daily.  PLAN   Pantoprazole  40 mg twice daily Repeat course Xifaxin 550 mg TID for 2 weeks.  Dramamine as needed Levsin as needed Avoid lactose is much as able (consider testing if needed) Gastroparesis diet Gas-X as needed Avoid lactose for now Possible pancreatic elastase  Probiotic/Keefer if needed.  Discussed possibility of Motegrity -if no improvement in nausea and bloating or begins with more constipation then may strongly consider this. Follow up 3 months.    Charmaine Melia, MSN, FNP-BC, AGACNP-BC [facility]

## 2023-10-02 ENCOUNTER — Ambulatory Visit (INDEPENDENT_AMBULATORY_CARE_PROVIDER_SITE_OTHER): Admitting: Gastroenterology

## 2023-10-02 ENCOUNTER — Encounter: Payer: Self-pay | Admitting: Gastroenterology

## 2023-10-02 ENCOUNTER — Ambulatory Visit: Attending: Nurse Practitioner

## 2023-10-02 VITALS — BP 138/79 | HR 73 | Temp 97.9°F | Ht 62.0 in | Wt 183.6 lb

## 2023-10-02 DIAGNOSIS — R42 Dizziness and giddiness: Secondary | ICD-10-CM | POA: Insufficient documentation

## 2023-10-02 DIAGNOSIS — K580 Irritable bowel syndrome with diarrhea: Secondary | ICD-10-CM | POA: Diagnosis not present

## 2023-10-02 DIAGNOSIS — R110 Nausea: Secondary | ICD-10-CM | POA: Diagnosis not present

## 2023-10-02 DIAGNOSIS — E785 Hyperlipidemia, unspecified: Secondary | ICD-10-CM | POA: Insufficient documentation

## 2023-10-02 DIAGNOSIS — K3184 Gastroparesis: Secondary | ICD-10-CM | POA: Diagnosis not present

## 2023-10-02 DIAGNOSIS — R6881 Early satiety: Secondary | ICD-10-CM | POA: Diagnosis not present

## 2023-10-02 DIAGNOSIS — K219 Gastro-esophageal reflux disease without esophagitis: Secondary | ICD-10-CM

## 2023-10-02 DIAGNOSIS — R140 Abdominal distension (gaseous): Secondary | ICD-10-CM

## 2023-10-02 DIAGNOSIS — I10 Essential (primary) hypertension: Secondary | ICD-10-CM | POA: Diagnosis not present

## 2023-10-02 LAB — ECHOCARDIOGRAM COMPLETE
AR max vel: 3.45 cm2
AV Area VTI: 3.18 cm2
AV Area mean vel: 3.29 cm2
AV Mean grad: 4 mmHg
AV Peak grad: 7.3 mmHg
Ao pk vel: 1.35 m/s
Area-P 1/2: 2.07 cm2
Calc EF: 67.6 %
MV VTI: 3.65 cm2
Single Plane A2C EF: 58.2 %
Single Plane A4C EF: 71.2 %

## 2023-10-02 MED ORDER — RIFAXIMIN 550 MG PO TABS: 550.0000 mg | ORAL_TABLET | Freq: Three times a day (TID) | ORAL | 0 refills | Status: DC

## 2023-10-02 NOTE — Patient Instructions (Addendum)
 We are going to try another repeat course of Xifaxan  to see if this helps you with the bloating, diarrhea.  You may continue the Levsin as needed.  Even while taking Xifaxan  if you are having any issues with constipation or no bowel movement in 24 hours then you may take a stool softener but please continue the whole course of Xifaxan .  For the bloating you may continue to take Gas-X or Phazyme as needed as well.  You may also need to limit lactose intake.  Given you have had good improvement with Keefer in the past you could resume this if it does not cause you any worsening symptoms.  You may continue to eat yogurt and if you would like to supplement with a probiotic I will list some recommendations below.  Recommended probiotics: Visbiome (refrigerated, available at [employer] pharmacies) Florastor [facility] Lud[facility] Ellen' colon health Culturelle  For the nausea when she to continue taking the Dramamine as needed.  Not necessarily for gastroparesis but in general for nausea CoQ 10 and L-carnitine have been shown to be helpful with nausea.  These are over-the-counter and you could try it if you would like but I would recommend waiting until after your course of Xifaxan .  Try to avoid introduction of new things all at once to really see what is working and not working.  I have attached another education handout for you today in regards to gastroparesis and bloating and diet/lifestyle recommendations.  As we discussed if the diarrhea worsens please let me know and we can consider additional stool testing.  Follow up in 3 months.   It was a pleasure to see you today. I want to create trusting relationships with patients. If you receive a survey regarding your visit,  I greatly appreciate you taking time to fill this out on paper or through your MyChart. I value your feedback.  Charmaine Melia, MSN, FNP-BC, AGACNP-BC [facility]

## 2023-10-04 ENCOUNTER — Ambulatory Visit: Admitting: Gastroenterology

## 2023-10-10 DIAGNOSIS — F4321 Adjustment disorder with depressed mood: Secondary | ICD-10-CM | POA: Diagnosis not present

## 2023-10-10 DIAGNOSIS — F322 Major depressive disorder, single episode, severe without psychotic features: Secondary | ICD-10-CM | POA: Diagnosis not present

## 2023-10-10 DIAGNOSIS — F411 Generalized anxiety disorder: Secondary | ICD-10-CM | POA: Diagnosis not present

## 2023-10-10 DIAGNOSIS — Z1331 Encounter for screening for depression: Secondary | ICD-10-CM | POA: Diagnosis not present

## 2023-10-22 DIAGNOSIS — M62838 Other muscle spasm: Secondary | ICD-10-CM | POA: Diagnosis not present

## 2023-10-22 DIAGNOSIS — M25511 Pain in right shoulder: Secondary | ICD-10-CM | POA: Diagnosis not present

## 2023-10-22 DIAGNOSIS — M7541 Impingement syndrome of right shoulder: Secondary | ICD-10-CM | POA: Diagnosis not present

## 2023-10-22 DIAGNOSIS — M5030 Other cervical disc degeneration, unspecified cervical region: Secondary | ICD-10-CM | POA: Diagnosis not present

## 2023-10-24 DIAGNOSIS — R42 Dizziness and giddiness: Secondary | ICD-10-CM | POA: Diagnosis not present

## 2023-10-25 ENCOUNTER — Telehealth: Payer: Self-pay

## 2023-10-25 ENCOUNTER — Ambulatory Visit: Admitting: Urology

## 2023-10-25 NOTE — Telephone Encounter (Signed)
 Appointment for today needing to be rescheduled for provider- Patient called and notified. Voiced understanding.  Patient reports intermittent kidney stone pain- requesting a 3 month supply of flomax sent to [facility] ( pharmacy on file)  Message sent to NP for approval.

## 2023-11-02 DIAGNOSIS — R300 Dysuria: Secondary | ICD-10-CM | POA: Diagnosis not present

## 2023-11-02 DIAGNOSIS — R350 Frequency of micturition: Secondary | ICD-10-CM | POA: Diagnosis not present

## 2023-11-02 DIAGNOSIS — Z6832 Body mass index (BMI) 32.0-32.9, adult: Secondary | ICD-10-CM | POA: Diagnosis not present

## 2023-11-02 DIAGNOSIS — R4582 Worries: Secondary | ICD-10-CM | POA: Diagnosis not present

## 2023-11-02 DIAGNOSIS — R42 Dizziness and giddiness: Secondary | ICD-10-CM | POA: Diagnosis not present

## 2023-11-05 ENCOUNTER — Ambulatory Visit: Admitting: Nurse Practitioner

## 2023-11-05 NOTE — Patient Instructions (Signed)

## 2023-11-06 ENCOUNTER — Encounter (INDEPENDENT_AMBULATORY_CARE_PROVIDER_SITE_OTHER): Admitting: Nurse Practitioner

## 2023-11-06 DIAGNOSIS — Z794 Long term (current) use of insulin: Secondary | ICD-10-CM

## 2023-11-06 DIAGNOSIS — I10 Essential (primary) hypertension: Secondary | ICD-10-CM

## 2023-11-06 DIAGNOSIS — E559 Vitamin D deficiency, unspecified: Secondary | ICD-10-CM

## 2023-11-06 DIAGNOSIS — E119 Type 2 diabetes mellitus without complications: Secondary | ICD-10-CM

## 2023-11-06 DIAGNOSIS — E782 Mixed hyperlipidemia: Secondary | ICD-10-CM

## 2023-11-06 NOTE — Progress Notes (Unsigned)
 Erroneous encounter

## 2023-11-07 DIAGNOSIS — F411 Generalized anxiety disorder: Secondary | ICD-10-CM | POA: Diagnosis not present

## 2023-11-07 DIAGNOSIS — F322 Major depressive disorder, single episode, severe without psychotic features: Secondary | ICD-10-CM | POA: Diagnosis not present

## 2023-11-07 DIAGNOSIS — F4321 Adjustment disorder with depressed mood: Secondary | ICD-10-CM | POA: Diagnosis not present

## 2023-11-08 DIAGNOSIS — H35033 Hypertensive retinopathy, bilateral: Secondary | ICD-10-CM | POA: Diagnosis not present

## 2023-11-12 DIAGNOSIS — R300 Dysuria: Secondary | ICD-10-CM | POA: Diagnosis not present

## 2023-11-12 DIAGNOSIS — Z6832 Body mass index (BMI) 32.0-32.9, adult: Secondary | ICD-10-CM | POA: Diagnosis not present

## 2023-11-14 ENCOUNTER — Ambulatory Visit (HOSPITAL_COMMUNITY)
Admission: RE | Admit: 2023-11-14 | Discharge: 2023-11-14 | Disposition: A | Discharge status: Home / Self Care | Source: Ambulatory Visit | Attending: Urology | Admitting: Urology

## 2023-11-14 ENCOUNTER — Encounter: Payer: Self-pay | Admitting: Urology

## 2023-11-14 ENCOUNTER — Ambulatory Visit: Admitting: Urology

## 2023-11-14 VITALS — BP 153/74 | HR 73

## 2023-11-14 DIAGNOSIS — N390 Urinary tract infection, site not specified: Secondary | ICD-10-CM

## 2023-11-14 DIAGNOSIS — Z87440 Personal history of urinary (tract) infections: Secondary | ICD-10-CM

## 2023-11-14 DIAGNOSIS — R109 Unspecified abdominal pain: Secondary | ICD-10-CM | POA: Diagnosis not present

## 2023-11-14 DIAGNOSIS — N200 Calculus of kidney: Secondary | ICD-10-CM | POA: Insufficient documentation

## 2023-11-14 MED ORDER — ALFUZOSIN HCL ER 10 MG PO TB24: 10.0000 mg | ORAL_TABLET | Freq: Every day | ORAL | 3 refills | Status: AC

## 2023-11-14 MED ORDER — DOXYCYCLINE HYCLATE 100 MG PO CAPS: 100.0000 mg | ORAL_CAPSULE | Freq: Two times a day (BID) | ORAL | 0 refills | Status: AC

## 2023-11-14 MED ORDER — FLUCONAZOLE 150 MG PO TABS: 150.0000 mg | ORAL_TABLET | Freq: Every day | ORAL | 0 refills | Status: AC

## 2023-11-14 NOTE — Progress Notes (Signed)
 11/14/2023 3:38 PM   Kelly Wiley 1955-08-19 [id number]  Referring provider: Dow Longs, PA-C [address] [place],  [place] [address]  No chief complaint on file.   HPI: She was treated for a UTI 10 days ago and repeat urine culture showed no growth.  She was initially treated with amoxicillin  and macrobid. She continue to have suprapubic pain, urinary frequency, urgency, and nocturia. She has pain with urination. KUB from today shows no calculi.    PMH: Past Medical History:  Diagnosis Date   Cervical spine disease    DM (diabetes mellitus) (HCC)    Dysphagia    Fibromyalgia    GERD (gastroesophageal reflux disease)    HTN (hypertension)    Hypercholesteremia    PONV (postoperative nausea and vomiting)    Seasonal allergies    Seizures (HCC)    last one > 20 years ago    Surgical History: Past Surgical History:  Procedure Laterality Date   BACK SURGERY     BALLOON DILATION N/A 09/19/2021   Procedure: BALLOON DILATION;  Surgeon: Cindie Carlin POUR, DO;  [facility];  Service: Endoscopy;  Laterality: N/A;   BIOPSY  09/19/2021   Procedure: BIOPSY;  Surgeon: Cindie Carlin POUR, DO;  [facility];  Service: Endoscopy;;  gastric;random colon;   CARPAL TUNNEL RELEASE  RIGHT   CERVICAL SPINE SURGERY  06/19/2010   COLONOSCOPY  [facility]    [facility] [place]   COLONOSCOPY WITH PROPOFOL  N/A 09/19/2021   Procedure: COLONOSCOPY WITH PROPOFOL ;  Surgeon: Cindie Carlin POUR, DO;  [facility];  Service: Endoscopy;  Laterality: N/A;  12:00pm   ESOPHAGOGASTRODUODENOSCOPY  01/24/2011   DOQ:IBDEYJHPJ 2o TO CERVICAL PLATES IN PT'S NECK AND MILD ESO MOTILITY DISORDER/Mild gastritis   ESOPHAGOGASTRODUODENOSCOPY  03/03/2011   DOQ:fpoi gastritis    ESOPHAGOGASTRODUODENOSCOPY (EGD) WITH PROPOFOL  N/A 09/19/2021   Procedure: ESOPHAGOGASTRODUODENOSCOPY (EGD) WITH PROPOFOL ;  Surgeon: Cindie Carlin POUR, DO;  [facility];  Service: Endoscopy;  Laterality: N/A;    EXTRACORPOREAL SHOCK WAVE LITHOTRIPSY Right 11/28/2022   Procedure: EXTRACORPOREAL SHOCK WAVE LITHOTRIPSY (ESWL);  Surgeon: Sherrilee Belvie CROME, MD;  Location: [facility];  Service: Urology;  Laterality: Right;   FLEXIBLE SIGMOIDOSCOPY  01/24/2011   SLF: Internal Hemorrhoids   SAVORY DILATION  01/24/2011   Procedure: SAVORY DILATION;  Surgeon: Margo CHRISTELLA Haddock, MD;  [facility];  Service: Endoscopy;  Laterality: N/A;   SHOULDER SURGERY  RIGHT   TUBAL LIGATION      Home Medications:  Allergies as of 11/14/2023       Reactions   Phenytoin Shortness Of Breath   Ativan [lorazepam]    Lowered blood pressure   Iodine 131 Other (See Comments)   Blacked out Dye*   Phenytoin Sodium Extended Other (See Comments)   Killed white blood cell at age of 27 Dilantin    Sulfa Antibiotics Other (See Comments)   Bones   Zarontin [ethosuximide]    Pt does not remember reaction         Medication List        Accurate as of November 14, 2023  3:38 PM. If you have any questions, ask your nurse or doctor.          STOP taking these medications    cloNIDine 0.1 MG tablet Commonly known as: CATAPRES   GAS-X PO   rifaximin  550 MG Tabs tablet Commonly known as: Xifaxan        TAKE these  medications    alfuzosin  10 MG 24 hr tablet Commonly known as: UROXATRAL  TAKE 1 TABLET BY MOUTH EVERY DAY AS NEEDED FOR KIDNEY STONE   calcium carbonate 500 MG chewable tablet Commonly known as: TUMS - dosed in mg elemental calcium Chew 1 tablet by mouth 2 (two) times daily.   carvedilol  12.5 MG tablet Commonly known as: COREG  TAKE (1) TABLET TWICE DAILY.   cephALEXin  250 MG capsule Commonly known as: KEFLEX  Take 1 capsule (250 mg total) by mouth daily.   diclofenac 75 MG EC tablet Commonly known as: VOLTAREN Take 75 mg by mouth 2 (two) times daily.   estradiol  0.1 MG/GM vaginal cream Commonly known as: ESTRACE  Discard plastic applicator. Insert a blueberry size amount (approximately  1 gram) of cream on fingertip inside vagina at bedtime two nights per week.   ezetimibe  10 MG tablet Commonly known as: ZETIA  Take 10 mg by mouth daily.   HumaLOG KwikPen 100 UNIT/ML KwikPen Generic drug: insulin lispro 30 Units 2 (two) times daily before a meal.   HYDROcodone -acetaminophen  10-325 MG tablet Commonly known as: NORCO Take 1 tablet by mouth every 6 (six) hours as needed.   hyoscyamine 0.125 MG tablet Commonly known as: LEVSIN Take 0.125 mg by mouth every 4 (four) hours as needed for cramping.   Lantus SoloStar 100 UNIT/ML Solostar Pen Generic drug: insulin glargine SMARTSIG:60 Unit(s) SUB-Q Daily   methocarbamol 500 MG tablet Commonly known as: ROBAXIN Take 500 mg by mouth daily.   Motion Sickness Relief 50 MG tablet Generic drug: dimenhyDRINATE Take 50 mg by mouth in the morning and at bedtime.   pantoprazole  40 MG tablet Commonly known as: PROTONIX  Take 40 mg by mouth 2 (two) times daily.   tiZANidine 4 MG tablet Commonly known as: ZANAFLEX Take 4 mg by mouth at bedtime.   Vitamin D  (Ergocalciferol ) 1.25 MG (50000 UNIT) Caps capsule Commonly known as: DRISDOL Take 50,000 Units by mouth 2 (two) times a week.        Allergies:  Allergies  Allergen Reactions   Phenytoin Shortness Of Breath   Ativan [Lorazepam]     Lowered blood pressure   Iodine 131 Other (See Comments)    Blacked out  Dye*    Phenytoin Sodium Extended Other (See Comments)    Killed white blood cell at age of 53  Dilantin    Sulfa Antibiotics Other (See Comments)    Bones    Zarontin [Ethosuximide]     Pt does not remember reaction     Family History: Family History  Problem Relation Age of Onset   Lung cancer Mother    Heart disease Mother        Mitral valve disease   Congenital heart disease Son        has a PPM   Diabetes Son    Colon cancer Neg Hx    Colon polyps Neg Hx     Social History:  reports that she quit smoking about 55 years ago. Her smoking  use included cigarettes. She started smoking about 57 years ago. She has a 1 pack-year smoking history. She has never used smokeless tobacco. She reports current alcohol  use. She reports that she does not use drugs.  ROS: All other review of systems were reviewed and are negative except what is noted above in HPI  Physical Exam: BP (!) 153/74   Pulse 73   Constitutional:  Alert and oriented, No acute distress. HEENT: [place] AT, moist mucus membranes.  Trachea  midline, no masses. Cardiovascular: No clubbing, cyanosis, or edema. Respiratory: Normal respiratory effort, no increased work of breathing. GI: Abdomen is soft, nontender, nondistended, no abdominal masses GU: No CVA tenderness.  Lymph: No cervical or inguinal lymphadenopathy. Skin: No rashes, bruises or suspicious lesions. Neurologic: Grossly intact, no focal deficits, moving all 4 extremities. Psychiatric: Normal mood and affect.  Laboratory Data: Lab Results  Component Value Date   WBC 7.0 12/28/2010   HGB 14.5 12/28/2010   HCT 43.0 12/28/2010   MCV 88.5 12/28/2010   PLT 231 12/28/2010    Lab Results  Component Value Date   CREATININE 1.0 03/29/2023    No results found for: PSA  No results found for: TESTOSTERONE  Lab Results  Component Value Date   HGBA1C 7.8 03/29/2023    Urinalysis    Component Value Date/Time   APPEARANCEUR Clear 04/03/2023 1326   GLUCOSEU Negative 04/03/2023 1326   BILIRUBINUR Negative 04/03/2023 1326   PROTEINUR 1+ (A) 04/03/2023 1326   NITRITE Positive (A) 04/03/2023 1326   LEUKOCYTESUR Trace (A) 04/03/2023 1326    Lab Results  Component Value Date   LABMICR See below: 04/03/2023   WBCUA 6-10 (A) 04/03/2023   LABEPIT >10 (A) 04/03/2023   BACTERIA Many (A) 04/03/2023    Pertinent Imaging: KUB today: Images reviewed and discussed with the patient  Results for orders placed during the hospital encounter of 01/01/23  DG Abd 1 View  Narrative CLINICAL DATA:  Kidney  stone.  Lithotripsy last month.  EXAM: ABDOMEN - 1 VIEW  COMPARISON:  Radiograph 11/28/2022  FINDINGS: Stool and bowel gas overlie the renal beds which obscures assessment for renal calculi. Potential calcification projecting over the left upper kidney. Moderate volume of colonic stool, no obstruction. Lower lumbar fusion hardware. Pelvic phleboliths are similar. Prior L2 compression fracture with augmentation.  IMPRESSION: Stool and bowel gas overlie the renal beds which obscures assessment for renal calculi. Potential calcification projecting over the left upper kidney.   Electronically Signed By: Andrea Gasman M.D. On: 01/22/2023 10:54  No results found for this or any previous visit.  No results found for this or any previous visit.  No results found for this or any previous visit.  Results for orders placed during the hospital encounter of 10/25/22  US  RENAL  Narrative CLINICAL DATA:  Kidney stone.  Follow-up.  EXAM: RENAL / URINARY TRACT ULTRASOUND COMPLETE  COMPARISON:  08/02/2020.  FINDINGS: Right Kidney:  Renal measurements: 13.3 x 5.5 x 5.2 cm = volume: 199.5 ML. No mass. Normal parenchymal echogenicity. Shadowing echogenic focus in the upper pole measuring 7 mm, consistent with a stone. No hydronephrosis.  Left Kidney:  Renal measurements: 10.7 x 4.8 x 4.4 cm = volume: 118 mL. Normal parenchymal echogenicity. Renal cortical thinning. No mass. No convincing stone. Possible stone noted in the lateral aspect of the left kidney on the prior exam not currently visualized. No hydronephrosis.  Bladder:  Decompressed and not well evaluated  Other:  None.  IMPRESSION: 1. No acute finding.  No hydronephrosis. 2. 7 mm echogenic focus consistent with a stone in the upper pole the right kidney. No current evidence of a left ureteral stone.   Electronically Signed By: Alm Parkins M.D. On: 10/31/2022 15:22  No results found for this or any  previous visit.  No results found for this or any previous visit.  Results for orders placed during the hospital encounter of 01/10/23  CT RENAL STONE STUDY  Narrative CLINICAL DATA:  Symptomatic  urolithiasis. Flank pain. Lithotripsy last month.  EXAM: CT ABDOMEN AND PELVIS WITHOUT CONTRAST  TECHNIQUE: Multidetector CT imaging of the abdomen and pelvis was performed following the standard protocol without IV contrast.  RADIATION DOSE REDUCTION: This exam was performed according to the departmental dose-optimization program which includes automated exposure control, adjustment of the mA and/or kV according to patient size and/or use of iterative reconstruction technique.  COMPARISON:  Radiograph 12/22/2022. Report from CT 01/16/2022, images unavailable.  FINDINGS: Lower chest: No focal airspace disease or pleural effusion. Normal heart size.  Hepatobiliary: Diffuse hepatic steatosis. There is subtle capsular nodularity of the liver. No evidence of focal liver lesion in the absence of IV contrast. Decompressed gallbladder. No pericholecystic inflammation or biliary dilatation.  Pancreas: Fatty atrophy.  No ductal dilatation or inflammation.  Spleen: Normal in size without focal abnormality.  Adrenals/Urinary Tract: Normal adrenal glands. Multiple calcifications in the lower pole of the left kidney, may represent combination of parenchymal calcifications and nonobstructing stones. The largest measures 4 mm. Additional punctate left intrarenal calculi. 5 mm calcification in the upper left kidney with adjacent scarring is likely a cortical calcification. There are no right intrarenal calculi. Slight prominence of the right renal pelvis but no renal pelvis stone. Potential punctate stone at the right ureteropelvic junction series 5, image 72, but no ureteral dilatation. Left ureter is decompressed, no ureteral stone. Urinary bladder is only minimally distended. No bladder  stone. No bladder wall thickening.  Stomach/Bowel: Stomach is moderately distended with ingested material. There is no small bowel obstruction or inflammation. Small to moderate colonic stool burden. Low lying cecum in the right pelvis. Normal appendix centrally.  Vascular/Lymphatic: Aortic atherosclerosis and tortuosity. No aneurysm. There are scattered small periportal nodes are typically reactive. Mild edema in the central small bowel mesentery with small mesenteric nodes.  Reproductive: Uterus and bilateral adnexa are unremarkable.  Other: No ascites.  No abdominal wall hernia.  Musculoskeletal: Chronic upper lumbar compression deformity with vertebral augmentation. Posterior rod with intrapedicular screw fusion L3 through L5. The L4-L5 interbody spacer projects into the spinal canal on the left. This is described on 05/05/2016 lumbar CT, those images are unavailable.  IMPRESSION: 1. Left renal calcifications likely represent combination of nonobstructing stones and parenchymal calcification. 2. Slight prominence of the right renal pelvis. Potential punctate stone at the right ureteropelvic junction, but no ureteral dilatation. 3. Hepatic steatosis. Subtle capsular nodularity of the liver, can be seen with cirrhosis. 4. Additional stable chronic findings as described.  Aortic Atherosclerosis (ICD10-I70.0).   Electronically Signed By: Andrea Gasman M.D. On: 01/22/2023 11:02   Assessment & Plan:    1. Kidney stones (Primary) Followup 6 monhts with renal US  - Urinalysis, Routine w reflex microscopic  2. Recurrent UTI Doxycycline  100mg  BID for 7 days    No follow-ups on file.  Belvie Clara, MD  [facility] [place]

## 2023-11-14 NOTE — Patient Instructions (Signed)

## 2023-11-15 LAB — URINALYSIS, ROUTINE W REFLEX MICROSCOPIC
Bilirubin, UA: NEGATIVE
Glucose, UA: NEGATIVE
Nitrite, UA: POSITIVE — AB
RBC, UA: NEGATIVE
Specific Gravity, UA: 1.025 (ref 1.005–1.030)
Urobilinogen, Ur: 2 mg/dL — ABNORMAL HIGH (ref 0.2–1.0)
pH, UA: 5.5 (ref 5.0–7.5)

## 2023-11-15 LAB — MICROSCOPIC EXAMINATION

## 2023-11-22 DIAGNOSIS — M47816 Spondylosis without myelopathy or radiculopathy, lumbar region: Secondary | ICD-10-CM | POA: Diagnosis not present

## 2023-11-22 DIAGNOSIS — M961 Postlaminectomy syndrome, not elsewhere classified: Secondary | ICD-10-CM | POA: Diagnosis not present

## 2023-11-23 DIAGNOSIS — R9082 White matter disease, unspecified: Secondary | ICD-10-CM | POA: Diagnosis not present

## 2023-11-23 DIAGNOSIS — R42 Dizziness and giddiness: Secondary | ICD-10-CM | POA: Diagnosis not present

## 2023-11-23 DIAGNOSIS — R519 Headache, unspecified: Secondary | ICD-10-CM | POA: Diagnosis not present

## 2023-11-29 ENCOUNTER — Ambulatory Visit: Payer: Self-pay | Admitting: Nurse Practitioner

## 2023-11-30 ENCOUNTER — Other Ambulatory Visit (HOSPITAL_COMMUNITY)

## 2023-12-07 DIAGNOSIS — N390 Urinary tract infection, site not specified: Secondary | ICD-10-CM | POA: Diagnosis not present

## 2023-12-07 DIAGNOSIS — R4582 Worries: Secondary | ICD-10-CM | POA: Diagnosis not present

## 2023-12-07 DIAGNOSIS — Z6832 Body mass index (BMI) 32.0-32.9, adult: Secondary | ICD-10-CM | POA: Diagnosis not present

## 2023-12-10 DIAGNOSIS — F411 Generalized anxiety disorder: Secondary | ICD-10-CM | POA: Diagnosis not present

## 2023-12-10 DIAGNOSIS — F4321 Adjustment disorder with depressed mood: Secondary | ICD-10-CM | POA: Diagnosis not present

## 2023-12-10 DIAGNOSIS — Z1331 Encounter for screening for depression: Secondary | ICD-10-CM | POA: Diagnosis not present

## 2023-12-10 DIAGNOSIS — F322 Major depressive disorder, single episode, severe without psychotic features: Secondary | ICD-10-CM | POA: Diagnosis not present

## 2023-12-19 DIAGNOSIS — N289 Disorder of kidney and ureter, unspecified: Secondary | ICD-10-CM | POA: Diagnosis not present

## 2023-12-19 DIAGNOSIS — M47816 Spondylosis without myelopathy or radiculopathy, lumbar region: Secondary | ICD-10-CM | POA: Diagnosis not present

## 2023-12-19 DIAGNOSIS — Z981 Arthrodesis status: Secondary | ICD-10-CM | POA: Diagnosis not present

## 2023-12-20 ENCOUNTER — Ambulatory Visit (INDEPENDENT_AMBULATORY_CARE_PROVIDER_SITE_OTHER): Admitting: Internal Medicine

## 2023-12-20 VITALS — BP 156/85 | HR 73 | Temp 98.6°F | Ht 62.0 in | Wt 185.8 lb

## 2023-12-20 DIAGNOSIS — K582 Mixed irritable bowel syndrome: Secondary | ICD-10-CM

## 2023-12-20 DIAGNOSIS — R140 Abdominal distension (gaseous): Secondary | ICD-10-CM

## 2023-12-20 DIAGNOSIS — K580 Irritable bowel syndrome with diarrhea: Secondary | ICD-10-CM

## 2023-12-20 DIAGNOSIS — K219 Gastro-esophageal reflux disease without esophagitis: Secondary | ICD-10-CM

## 2023-12-20 DIAGNOSIS — R110 Nausea: Secondary | ICD-10-CM

## 2023-12-20 DIAGNOSIS — K3184 Gastroparesis: Secondary | ICD-10-CM | POA: Diagnosis not present

## 2023-12-20 MED ORDER — PRUCALOPRIDE SUCCINATE 2 MG PO TABS: 2.0000 mg | ORAL_TABLET | Freq: Every day | ORAL | 11 refills | Status: AC

## 2023-12-20 NOTE — Progress Notes (Signed)
 Referring Provider: Dow Longs, PA-C Primary Care Physician:  Dow Longs, PA-C Primary GI:  Dr. Cindie  Chief Complaint  Patient presents with   Follow-up    Follow up on IBS-D, but lately pt has had issues with constipation    HPI:   Kelly Wiley is a 68 y.o. female who presents to the clinic today for follow-up visit.  Multiple GI complaints for me today.  Nausea, early satiety, gastroparesis: Has failed Reglan  and erythromycin in the past secondary to side effects. Continues to use Dramamine as needed and this works well for her. No Zofran . Has continued on pain medication which likely is exacerbating her symptoms somewhat.  Reports issues with vegetables primarily broccoli and cauliflower.  Eating small meals.  Irritable bowel syndrome with constipation and diarrhea: Doing fairly well.  Taking Levsin as needed.  Unable to afford Xifaxan .  Sagal testing negative in the past.  Was recommended pancreatic elastase testing the patient has not completed this.  Has been avoiding lactose.  Taking Gas-X as needed.  Chronic GERD: Well-controlled on pantoprazole  40 mg daily.     Procedure history: EGD 09/19/2021 small hiatal hernia, mild Schatzki's ring status post dilation, gastritis, normal duodenum.  Biopsies negative for H. pylori.  Colonoscopy 09/19/2021 2 polyps removed, tubular adenomas on path, colon biopsies negative for microscopic colitis, recommended 5-year recall.  Past Medical History:  Diagnosis Date   Cervical spine disease    DM (diabetes mellitus) (HCC)    Dysphagia    Fibromyalgia    GERD (gastroesophageal reflux disease)    HTN (hypertension)    Hypercholesteremia    PONV (postoperative nausea and vomiting)    Seasonal allergies    Seizures (HCC)    last one > 20 years ago    Past Surgical History:  Procedure Laterality Date   BACK SURGERY     BALLOON DILATION N/A 09/19/2021   Procedure: BALLOON DILATION;  Surgeon: Cindie Carlin POUR, DO;  [facility];  Service: Endoscopy;  Laterality: N/A;   BIOPSY  09/19/2021   Procedure: BIOPSY;  Surgeon: Cindie Carlin POUR, DO;  [facility];  Service: Endoscopy;;  gastric;random colon;   CARPAL TUNNEL RELEASE  RIGHT   CERVICAL SPINE SURGERY  06/19/2010   COLONOSCOPY  2008 [facility]    F[place]IS[place]MAN NL   COLONOSCOPY WITH PROPOFOL  N/A 09/19/2021   Procedure: COLONOSCOPY WITH PROPOFOL ;  Surgeon: Cindie Carlin POUR, DO;  [facility];  Service: Endoscopy;  Laterality: N/A;  12:00pm   ESOPHAGOGASTRODUODENOSCOPY  01/24/2011   DOQ:IBDEYJHPJ 2o TO CERVICAL PLATES IN PT'S NECK AND MILD ESO MOTILITY DISORDER/Mild gastritis   ESOPHAGOGASTRODUODENOSCOPY  03/03/2011   DOQ:fpoi gastritis    ESOPHAGOGASTRODUODENOSCOPY (EGD) WITH PROPOFOL  N/A 09/19/2021   Procedure: ESOPHAGOGASTRODUODENOSCOPY (EGD) WITH PROPOFOL ;  Surgeon: Cindie Carlin POUR, DO;  [facility];  Service: Endoscopy;  Laterality: N/A;   EXTRACORPOREAL SHOCK WAVE LITHOTRIPSY Right 11/28/2022   Procedure: EXTRACORPOREAL SHOCK WAVE LITHOTRIPSY (ESWL);  Surgeon: Sherrilee Belvie CROME, MD;  Location: [facility];  Service: Urology;  Laterality: Right;   FLEXIBLE SIGMOIDOSCOPY  01/24/2011   SLF: Internal Hemorrhoids   SAVORY DILATION  01/24/2011   Procedure: SAVORY DILATION;  Surgeon: Margo CHRISTELLA Haddock, MD;  [facility];  Service: Endoscopy;  Laterality: N/A;   SHOULDER SURGERY  RIGHT   TUBAL LIGATION      Current Outpatient Medications  Medication Sig Dispense Refill   alfuzosin  (UROXATRAL ) 10 MG 24 hr tablet Take 1  tablet (10 mg total) by mouth daily with breakfast. 90 tablet 3   calcium carbonate (TUMS - DOSED IN MG ELEMENTAL CALCIUM) 500 MG chewable tablet Chew 1 tablet by mouth 2 (two) times daily.     carvedilol  (COREG ) 12.5 MG tablet TAKE (1) TABLET TWICE DAILY. 60 tablet 9   cephALEXin  (KEFLEX ) 250 MG capsule Take 1 capsule (250 mg total) by mouth daily. 30 capsule 11   diclofenac (VOLTAREN) 75 MG EC tablet Take 75  mg by mouth 2 (two) times daily.     dimenhyDRINATE (MOTION SICKNESS RELIEF) 50 MG tablet Take 50 mg by mouth in the morning and at bedtime.     estradiol  (ESTRACE ) 0.1 MG/GM vaginal cream Discard plastic applicator. Insert a blueberry size amount (approximately 1 gram) of cream on fingertip inside vagina at bedtime two nights per week. 30 g 3   HUMALOG KWIKPEN 100 UNIT/ML KwikPen 30 Units 2 (two) times daily before a meal.     HYDROcodone -acetaminophen  (NORCO) 10-325 MG tablet Take 1 tablet by mouth every 6 (six) hours as needed. 30 tablet 0   hyoscyamine (LEVSIN) 0.125 MG tablet Take 0.125 mg by mouth every 4 (four) hours as needed for cramping.     LANTUS SOLOSTAR 100 UNIT/ML Solostar Pen SMARTSIG:60 Unit(s) SUB-Q Daily     methocarbamol (ROBAXIN) 500 MG tablet Take 500 mg by mouth daily.     pantoprazole  (PROTONIX ) 40 MG tablet Take 40 mg by mouth 2 (two) times daily.     Prucalopride Succinate (MOTEGRITY) 2 MG TABS Take 1 tablet (2 mg total) by mouth daily at 6 (six) AM. 30 tablet 11   tiZANidine (ZANAFLEX) 4 MG tablet Take 4 mg by mouth at bedtime.     Vitamin D , Ergocalciferol , (DRISDOL) 1.25 MG (50000 UNIT) CAPS capsule Take 50,000 Units by mouth 2 (two) times a week.     doxycycline  (VIBRAMYCIN ) 100 MG capsule Take 1 capsule (100 mg total) by mouth every 12 (twelve) hours. (Patient not taking: Reported on 12/20/2023) 14 capsule 0   ezetimibe  (ZETIA ) 10 MG tablet Take 10 mg by mouth daily. (Patient not taking: Reported on 12/20/2023)     fluconazole  (DIFLUCAN ) 150 MG tablet Take 1 tablet (150 mg total) by mouth daily. (Patient not taking: Reported on 12/20/2023) 2 tablet 0   No current facility-administered medications for this visit.    Allergies as of 12/20/2023 - Review Complete 12/20/2023  Allergen Reaction Noted   Phenytoin Shortness Of Breath 10/02/2023   Ativan [lorazepam]  07/05/2015   Iodine 131 Other (See Comments) 07/21/2010   Phenytoin sodium extended Other (See Comments)  07/21/2010   Sulfa antibiotics Other (See Comments) 07/21/2010   Zarontin [ethosuximide]  09/09/2021    Family History  Problem Relation Age of Onset   Lung cancer Mother    Heart disease Mother        Mitral valve disease   Congenital heart disease Son        has a PPM   Diabetes Son    Colon cancer Neg Hx    Colon polyps Neg Hx     Social History   Socioeconomic History   Marital status: Widowed    Spouse name: Not on file   Number of children: Not on file   Years of education: Not on file   Highest education level: Not on file  Occupational History   Not on file  Tobacco Use   Smoking status: Former    Current packs/day: 0.00  Average packs/day: 0.5 packs/day for 2.0 years (1.0 ttl pk-yrs)    Types: Cigarettes    Start date: 10/12/1966    Quit date: 10/11/1968    Years since quitting: 55.2   Smokeless tobacco: Never  Vaping Use   Vaping status: Never Used  Substance and Sexual Activity   Alcohol  use: Yes    Alcohol /week: 0.0 standard drinks of alcohol     Comment: occasionally   Drug use: No   Sexual activity: Not Currently  Other Topics Concern   Not on file  Social History Narrative   MARRIED, 2 KIDS, YOUNGEST 31 YO.   Son has DM, malabsorption   Works in Audiological scientist in [place]   EtOH: rare   No tobacco products   Social Drivers of Corporate investment banker Strain: Not on file  Food Insecurity: No Food Insecurity (11/11/2021)   [facility]   Hunger Vital Sign    Within the past 12 months, you worried that your food would run out before you got the money to buy more.: Never true    Within the past 12 months, the food you bought just didn't last and you didn't have money to get more.: Never true  Transportation Needs: No Transportation Needs (01/19/2022)   Received from [facility] - Transportation    Lack of Transportation (Medical): No    Lack of Transportation (Non-Medical): No  Physical Activity: Not on file   Stress: No Stress Concern Present (11/02/2020)   Received from [facility] - Occupational Stress Questionnaire    Feeling of Stress : Not at all  Social Connections: Unknown (07/28/2021)   Received from [facility]   Social Network    Social Network: Not on file    Subjective: Review of Systems  Constitutional:  Negative for chills and fever.  HENT:  Negative for congestion and hearing loss.   Eyes:  Negative for blurred vision and double vision.  Respiratory:  Negative for cough and shortness of breath.   Cardiovascular:  Negative for chest pain and palpitations.  Gastrointestinal:  Positive for constipation, diarrhea, heartburn and nausea. Negative for blood in stool, melena and vomiting.  Genitourinary:  Negative for dysuria and urgency.  Musculoskeletal:  Negative for joint pain and myalgias.  Skin:  Negative for itching and rash.  Neurological:  Negative for dizziness and headaches.  Psychiatric/Behavioral:  Negative for depression. The patient is not nervous/anxious.      Objective: BP (!) 156/85   Pulse 73   Temp 98.6 F (37 C)   Ht 5' 2 (1.575 m)   Wt 185 lb 12.8 oz (84.3 kg)   BMI 33.98 kg/m  Physical Exam Constitutional:      Appearance: Normal appearance.  HENT:     Head: Normocephalic and atraumatic.  Eyes:     Extraocular Movements: Extraocular movements intact.     Conjunctiva/sclera: Conjunctivae normal.  Cardiovascular:     Rate and Rhythm: Normal rate and regular rhythm.  Pulmonary:     Effort: Pulmonary effort is normal.     Breath sounds: Normal breath sounds.  Abdominal:     General: Bowel sounds are normal.     Palpations: Abdomen is soft.  Musculoskeletal:        General: No swelling. Normal range of motion.     Cervical back: Normal range of motion and neck supple.  Skin:    General: Skin is warm and dry.  Coloration: Skin is not jaundiced.  Neurological:     General: No focal deficit  present.     Mental Status: She is alert and oriented to person, place, and time.  Psychiatric:        Mood and Affect: Mood normal.        Behavior: Behavior normal.      Assessment/Plan:  1.  Irritable bowel syndrome with constipation and diarrhea-continue Levsin as needed.  Continue to avoid lactose.  Gas-X as needed.  Will trial on Motegrity.  2.  Gastroparesis-continue Dramamine as needed.  Chronic opioids also likely playing a role.  Motegrity as above.    4-6 small meals daily Low fat diet Low fiber diet (avoid raw fruits and vegetables). Avoid spicy foods, acidic foods, and carbonated beverages Avoid alcohol  and smoking because they both can exacerbate delayed gastric emptying secondary to decreased antral contractility  3.  Chronic GERD-well-controlled on pantoprazole  twice daily.  Will continue.  Follow-up in 2 to 3 months   12/20/2023 4:33 PM

## 2023-12-20 NOTE — Patient Instructions (Addendum)
 I am going to send in a prescription for Motegrity (prucalopride) to your pharmacy.  We may need to perform a prior authorization for this medication.  You may also go online at [employer] to get a coupon as well.  Gastroparesis recommendations:  4-6 small meals daily Low fat diet Low fiber diet (avoid raw fruits and vegetables). Avoid spicy foods, acidic foods, and carbonated beverages Avoid alcohol  and smoking because they both can exacerbate delayed gastric emptying secondary to decreased antral contractility  Follow-up in 2 to 3 months.  It was very nice seeing you again today.  Dr. Cindie

## 2023-12-21 DIAGNOSIS — M170 Bilateral primary osteoarthritis of knee: Secondary | ICD-10-CM | POA: Diagnosis not present

## 2023-12-25 DIAGNOSIS — E039 Hypothyroidism, unspecified: Secondary | ICD-10-CM | POA: Diagnosis not present

## 2023-12-25 DIAGNOSIS — E162 Hypoglycemia, unspecified: Secondary | ICD-10-CM | POA: Diagnosis not present

## 2023-12-25 DIAGNOSIS — E119 Type 2 diabetes mellitus without complications: Secondary | ICD-10-CM | POA: Diagnosis not present

## 2023-12-25 DIAGNOSIS — R5382 Chronic fatigue, unspecified: Secondary | ICD-10-CM | POA: Diagnosis not present

## 2023-12-25 DIAGNOSIS — E876 Hypokalemia: Secondary | ICD-10-CM | POA: Diagnosis not present

## 2023-12-27 DIAGNOSIS — Z6832 Body mass index (BMI) 32.0-32.9, adult: Secondary | ICD-10-CM | POA: Diagnosis not present

## 2023-12-27 DIAGNOSIS — Z1389 Encounter for screening for other disorder: Secondary | ICD-10-CM | POA: Diagnosis not present

## 2023-12-27 DIAGNOSIS — Z0001 Encounter for general adult medical examination with abnormal findings: Secondary | ICD-10-CM | POA: Diagnosis not present

## 2023-12-27 DIAGNOSIS — Z1331 Encounter for screening for depression: Secondary | ICD-10-CM | POA: Diagnosis not present

## 2023-12-27 DIAGNOSIS — Z0000 Encounter for general adult medical examination without abnormal findings: Secondary | ICD-10-CM | POA: Diagnosis not present

## 2023-12-28 DIAGNOSIS — R300 Dysuria: Secondary | ICD-10-CM | POA: Diagnosis not present

## 2023-12-28 DIAGNOSIS — I10 Essential (primary) hypertension: Secondary | ICD-10-CM | POA: Diagnosis not present

## 2023-12-28 DIAGNOSIS — R4582 Worries: Secondary | ICD-10-CM | POA: Diagnosis not present

## 2023-12-28 DIAGNOSIS — M5432 Sciatica, left side: Secondary | ICD-10-CM | POA: Diagnosis not present

## 2023-12-28 DIAGNOSIS — Z6832 Body mass index (BMI) 32.0-32.9, adult: Secondary | ICD-10-CM | POA: Diagnosis not present

## 2023-12-28 DIAGNOSIS — Z1331 Encounter for screening for depression: Secondary | ICD-10-CM | POA: Diagnosis not present

## 2023-12-31 DIAGNOSIS — M25561 Pain in right knee: Secondary | ICD-10-CM | POA: Diagnosis not present

## 2023-12-31 DIAGNOSIS — M1711 Unilateral primary osteoarthritis, right knee: Secondary | ICD-10-CM | POA: Diagnosis not present

## 2023-12-31 DIAGNOSIS — M170 Bilateral primary osteoarthritis of knee: Secondary | ICD-10-CM | POA: Diagnosis not present

## 2023-12-31 DIAGNOSIS — M25562 Pain in left knee: Secondary | ICD-10-CM | POA: Diagnosis not present

## 2023-12-31 DIAGNOSIS — M1712 Unilateral primary osteoarthritis, left knee: Secondary | ICD-10-CM | POA: Diagnosis not present

## 2024-01-03 ENCOUNTER — Ambulatory Visit: Admitting: Gastroenterology

## 2024-01-07 DIAGNOSIS — F4321 Adjustment disorder with depressed mood: Secondary | ICD-10-CM | POA: Diagnosis not present

## 2024-01-07 DIAGNOSIS — F411 Generalized anxiety disorder: Secondary | ICD-10-CM | POA: Diagnosis not present

## 2024-01-07 DIAGNOSIS — Z1331 Encounter for screening for depression: Secondary | ICD-10-CM | POA: Diagnosis not present

## 2024-01-07 DIAGNOSIS — F322 Major depressive disorder, single episode, severe without psychotic features: Secondary | ICD-10-CM | POA: Diagnosis not present

## 2024-01-07 DIAGNOSIS — M170 Bilateral primary osteoarthritis of knee: Secondary | ICD-10-CM | POA: Diagnosis not present

## 2024-01-15 DIAGNOSIS — M48061 Spinal stenosis, lumbar region without neurogenic claudication: Secondary | ICD-10-CM | POA: Diagnosis not present

## 2024-01-15 DIAGNOSIS — M51369 Other intervertebral disc degeneration, lumbar region without mention of lumbar back pain or lower extremity pain: Secondary | ICD-10-CM | POA: Diagnosis not present

## 2024-01-15 DIAGNOSIS — M47816 Spondylosis without myelopathy or radiculopathy, lumbar region: Secondary | ICD-10-CM | POA: Diagnosis not present

## 2024-01-15 DIAGNOSIS — Z981 Arthrodesis status: Secondary | ICD-10-CM | POA: Diagnosis not present

## 2024-01-30 DIAGNOSIS — R110 Nausea: Secondary | ICD-10-CM | POA: Diagnosis not present

## 2024-01-30 DIAGNOSIS — R9431 Abnormal electrocardiogram [ECG] [EKG]: Secondary | ICD-10-CM | POA: Diagnosis not present

## 2024-01-30 DIAGNOSIS — N342 Other urethritis: Secondary | ICD-10-CM | POA: Diagnosis not present

## 2024-01-30 DIAGNOSIS — Z87891 Personal history of nicotine dependence: Secondary | ICD-10-CM | POA: Diagnosis not present

## 2024-01-30 DIAGNOSIS — R42 Dizziness and giddiness: Secondary | ICD-10-CM | POA: Diagnosis not present

## 2024-02-04 DIAGNOSIS — R4582 Worries: Secondary | ICD-10-CM | POA: Diagnosis not present

## 2024-02-04 DIAGNOSIS — R519 Headache, unspecified: Secondary | ICD-10-CM | POA: Diagnosis not present

## 2024-02-04 DIAGNOSIS — N3090 Cystitis, unspecified without hematuria: Secondary | ICD-10-CM | POA: Diagnosis not present

## 2024-02-04 DIAGNOSIS — Z6831 Body mass index (BMI) 31.0-31.9, adult: Secondary | ICD-10-CM | POA: Diagnosis not present

## 2024-02-05 DIAGNOSIS — Z1331 Encounter for screening for depression: Secondary | ICD-10-CM | POA: Diagnosis not present

## 2024-02-05 DIAGNOSIS — F322 Major depressive disorder, single episode, severe without psychotic features: Secondary | ICD-10-CM | POA: Diagnosis not present

## 2024-02-05 DIAGNOSIS — F4321 Adjustment disorder with depressed mood: Secondary | ICD-10-CM | POA: Diagnosis not present

## 2024-02-05 DIAGNOSIS — F411 Generalized anxiety disorder: Secondary | ICD-10-CM | POA: Diagnosis not present

## 2024-02-20 ENCOUNTER — Telehealth: Payer: Self-pay

## 2024-02-20 NOTE — Telephone Encounter (Signed)
 Pt's insurance will not cover Prucalopride 2mg . Tablets. Pt's plan preferred alternative is Lubiprostone, Linzess, and Trulance. Please advise

## 2024-02-25 DIAGNOSIS — Z23 Encounter for immunization: Secondary | ICD-10-CM | POA: Diagnosis not present

## 2024-02-26 DIAGNOSIS — M4805 Spinal stenosis, thoracolumbar region: Secondary | ICD-10-CM | POA: Diagnosis not present

## 2024-02-26 DIAGNOSIS — M48061 Spinal stenosis, lumbar region without neurogenic claudication: Secondary | ICD-10-CM | POA: Diagnosis not present

## 2024-02-26 DIAGNOSIS — Z981 Arthrodesis status: Secondary | ICD-10-CM | POA: Diagnosis not present

## 2024-02-29 DIAGNOSIS — R300 Dysuria: Secondary | ICD-10-CM | POA: Diagnosis not present

## 2024-02-29 DIAGNOSIS — Z6832 Body mass index (BMI) 32.0-32.9, adult: Secondary | ICD-10-CM | POA: Diagnosis not present

## 2024-02-29 DIAGNOSIS — R350 Frequency of micturition: Secondary | ICD-10-CM | POA: Diagnosis not present

## 2024-02-29 DIAGNOSIS — R4582 Worries: Secondary | ICD-10-CM | POA: Diagnosis not present

## 2024-02-29 DIAGNOSIS — Z87440 Personal history of urinary (tract) infections: Secondary | ICD-10-CM | POA: Diagnosis not present

## 2024-03-06 ENCOUNTER — Ambulatory Visit: Admitting: Gastroenterology

## 2024-03-10 ENCOUNTER — Ambulatory Visit: Admitting: Gastroenterology

## 2024-03-24 ENCOUNTER — Ambulatory Visit: Admitting: Nurse Practitioner

## 2024-04-07 ENCOUNTER — Telehealth: Payer: Self-pay

## 2024-04-07 NOTE — Telephone Encounter (Signed)
 Auth Submission: NO AUTH NEEDED Site of care: Site of care: [facility] Payer: MEDICARE A/B, [employer] SUPP Medication & CPT/J Code(s) submitted: Reclast  (Zolendronic acid) J3489 Diagnosis Code:  Route of submission (phone, fax, portal): PORTAL Phone # Fax # Auth type: Buy/Bill HB Units/visits requested: 5MG  X 1 DOSE Reference number:  Approval from: 04/07/24 to 03/19/25

## 2024-04-09 ENCOUNTER — Other Ambulatory Visit (HOSPITAL_COMMUNITY): Payer: Self-pay | Admitting: Sports Medicine

## 2024-04-09 NOTE — Progress Notes (Signed)
 Received Reclasst order  Doses: 12/19/2021, 04/12/2023  Renal function and calcium wnl on 04/05/2024 (CareEverywhere)  Sherry Pennant, PharmD, MPH, BCPS, CPP Clinical Pharmacist

## 2024-04-11 ENCOUNTER — Ambulatory Visit: Payer: Medicare Other

## 2024-05-15 ENCOUNTER — Ambulatory Visit

## 2024-05-16 ENCOUNTER — Other Ambulatory Visit (HOSPITAL_COMMUNITY)

## 2024-05-26 ENCOUNTER — Ambulatory Visit: Admitting: Urology

## 2024-06-23 ENCOUNTER — Ambulatory Visit: Admitting: Nurse Practitioner
